# Patient Record
Sex: Male | Born: 1955
Health system: Southern US, Community
[De-identification: ages and names within clinical notes are randomized; demographics above are authoritative.]

## PROBLEM LIST (undated history)

## (undated) DIAGNOSIS — K219 Gastro-esophageal reflux disease without esophagitis: Secondary | ICD-10-CM

## (undated) DIAGNOSIS — E785 Hyperlipidemia, unspecified: Secondary | ICD-10-CM

## (undated) DIAGNOSIS — E1165 Type 2 diabetes mellitus with hyperglycemia: Secondary | ICD-10-CM

## (undated) HISTORY — DX: Hyperlipidemia, unspecified: E78.5

## (undated) HISTORY — PX: NO PAST SURGERIES: SHX2092

## (undated) HISTORY — DX: Gastro-esophageal reflux disease without esophagitis: K21.9

---

## 2006-05-31 ENCOUNTER — Ambulatory Visit: Payer: Self-pay | Admitting: Internal Medicine

## 2006-05-31 DIAGNOSIS — M545 Low back pain, unspecified: Secondary | ICD-10-CM | POA: Insufficient documentation

## 2006-07-09 ENCOUNTER — Ambulatory Visit (HOSPITAL_COMMUNITY): Admission: RE | Admit: 2006-07-09 | Discharge: 2006-07-09 | Payer: Self-pay | Admitting: Internal Medicine

## 2006-07-09 ENCOUNTER — Ambulatory Visit: Payer: Self-pay | Admitting: Internal Medicine

## 2006-07-09 DIAGNOSIS — R7309 Other abnormal glucose: Secondary | ICD-10-CM | POA: Insufficient documentation

## 2006-07-26 ENCOUNTER — Ambulatory Visit: Payer: Self-pay | Admitting: Internal Medicine

## 2006-11-28 ENCOUNTER — Ambulatory Visit: Payer: Self-pay | Admitting: Internal Medicine

## 2007-02-25 ENCOUNTER — Encounter (INDEPENDENT_AMBULATORY_CARE_PROVIDER_SITE_OTHER): Payer: Self-pay | Admitting: Internal Medicine

## 2007-02-25 DIAGNOSIS — E785 Hyperlipidemia, unspecified: Secondary | ICD-10-CM | POA: Insufficient documentation

## 2007-02-25 DIAGNOSIS — E1169 Type 2 diabetes mellitus with other specified complication: Secondary | ICD-10-CM | POA: Insufficient documentation

## 2007-04-01 ENCOUNTER — Telehealth (INDEPENDENT_AMBULATORY_CARE_PROVIDER_SITE_OTHER): Payer: Self-pay | Admitting: *Deleted

## 2007-04-18 ENCOUNTER — Ambulatory Visit (HOSPITAL_COMMUNITY): Admission: RE | Admit: 2007-04-18 | Discharge: 2007-04-18 | Payer: Self-pay | Admitting: Internal Medicine

## 2007-04-18 ENCOUNTER — Ambulatory Visit: Payer: Self-pay | Admitting: Internal Medicine

## 2007-04-18 DIAGNOSIS — M79609 Pain in unspecified limb: Secondary | ICD-10-CM | POA: Insufficient documentation

## 2007-04-18 LAB — CONVERTED CEMR LAB: Hgb A1c MFr Bld: 6.2 %

## 2007-04-20 ENCOUNTER — Encounter (INDEPENDENT_AMBULATORY_CARE_PROVIDER_SITE_OTHER): Payer: Self-pay | Admitting: Internal Medicine

## 2007-04-20 LAB — CONVERTED CEMR LAB
LDL Cholesterol: 137 mg/dL — ABNORMAL HIGH (ref 0–99)
Total CHOL/HDL Ratio: 3.2

## 2007-05-15 ENCOUNTER — Encounter (INDEPENDENT_AMBULATORY_CARE_PROVIDER_SITE_OTHER): Payer: Self-pay | Admitting: Internal Medicine

## 2013-02-08 ENCOUNTER — Observation Stay (HOSPITAL_COMMUNITY)
Admission: EM | Admit: 2013-02-08 | Discharge: 2013-02-10 | Disposition: A | Discharge status: Home / Self Care | Payer: 59 | Attending: Internal Medicine | Admitting: Internal Medicine

## 2013-02-08 ENCOUNTER — Encounter (HOSPITAL_COMMUNITY): Payer: Self-pay | Admitting: Radiology

## 2013-02-08 ENCOUNTER — Emergency Department (HOSPITAL_COMMUNITY): Payer: 59

## 2013-02-08 DIAGNOSIS — E118 Type 2 diabetes mellitus with unspecified complications: Secondary | ICD-10-CM | POA: Diagnosis present

## 2013-02-08 DIAGNOSIS — R079 Chest pain, unspecified: Principal | ICD-10-CM | POA: Insufficient documentation

## 2013-02-08 DIAGNOSIS — E119 Type 2 diabetes mellitus without complications: Secondary | ICD-10-CM | POA: Diagnosis present

## 2013-02-08 DIAGNOSIS — E785 Hyperlipidemia, unspecified: Secondary | ICD-10-CM | POA: Insufficient documentation

## 2013-02-08 DIAGNOSIS — R0602 Shortness of breath: Secondary | ICD-10-CM | POA: Insufficient documentation

## 2013-02-08 DIAGNOSIS — R7309 Other abnormal glucose: Secondary | ICD-10-CM | POA: Diagnosis present

## 2013-02-08 DIAGNOSIS — E1169 Type 2 diabetes mellitus with other specified complication: Secondary | ICD-10-CM | POA: Diagnosis present

## 2013-02-08 DIAGNOSIS — D72829 Elevated white blood cell count, unspecified: Secondary | ICD-10-CM

## 2013-02-08 DIAGNOSIS — I440 Atrioventricular block, first degree: Secondary | ICD-10-CM | POA: Insufficient documentation

## 2013-02-08 DIAGNOSIS — E1165 Type 2 diabetes mellitus with hyperglycemia: Secondary | ICD-10-CM | POA: Diagnosis present

## 2013-02-08 DIAGNOSIS — I319 Disease of pericardium, unspecified: Secondary | ICD-10-CM | POA: Insufficient documentation

## 2013-02-08 HISTORY — DX: Type 2 diabetes mellitus with hyperglycemia: E11.65

## 2013-02-08 LAB — POCT I-STAT TROPONIN I: Troponin i, poc: 0.01 ng/mL (ref 0.00–0.08)

## 2013-02-08 LAB — BASIC METABOLIC PANEL
BUN: 14 mg/dL (ref 6–23)
CO2: 25 mEq/L (ref 19–32)
Chloride: 100 mEq/L (ref 96–112)
Creatinine, Ser: 0.9 mg/dL (ref 0.50–1.35)
GFR calc Af Amer: >90 mL/min (ref >90)
Glucose, Bld: 240 mg/dL — ABNORMAL HIGH (ref 70–99)
Potassium: 3.7 mEq/L (ref 3.5–5.1)

## 2013-02-08 LAB — CBC
HCT: 39.8 % (ref 39.0–52.0)
MCV: 80.7 fL (ref 78.0–100.0)
Platelets: 245 10*3/uL (ref 150–400)

## 2013-02-08 MED ORDER — MORPHINE SULFATE 4 MG/ML IJ SOLN
4.0000 mg | Freq: Once | INTRAMUSCULAR | Status: AC
  Administered 2013-02-08: 4 mg via INTRAVENOUS
  Filled 2013-02-08: qty 1

## 2013-02-08 MED ORDER — IOHEXOL 350 MG/ML SOLN
100.0000 mL | Freq: Once | INTRAVENOUS | Status: AC | PRN
  Administered 2013-02-08: 100 mL via INTRAVENOUS

## 2013-02-08 MED ORDER — ASPIRIN 81 MG PO CHEW
324.0000 mg | CHEWABLE_TABLET | Freq: Once | ORAL | Status: AC
  Administered 2013-02-08: 324 mg via ORAL
  Filled 2013-02-08: qty 4

## 2013-02-08 NOTE — ED Notes (Signed)
Pt reports left sided CP at 1500 today while watching television. Denies N/V, diaphoresis and SOB. Worse with inspiration. Denies at this time. NAD.

## 2013-02-08 NOTE — ED Notes (Signed)
Denis N/V, diaphoresis, weakness

## 2013-02-08 NOTE — ED Provider Notes (Signed)
CSN: [id number]     Arrival date & time 02/08/13  2047 History   First MD Initiated Contact with Patient 02/08/13 2050     Chief Complaint  Patient presents with  . Chest Pain   (Consider location/radiation/quality/duration/timing/severity/associated sxs/prior Treatment) HPI  57 year old male, Spanish-speaking, presents for evaluations of chest pain. History obtained using [employer] phone intepreter.  Patient states he developed acute onset of sharp pleuritic chest pain to his right chest radiates to his right, which started at 3 PM today. The pain happened while he was sitting and watching TV. Pain is worsened when he taking deep breath or when laying down. Improves with sitting up or walking.  Never had pain like this before. Pain is currently 6/10. No complaints of fever, chills, neck pain, lightheadedness, dizziness, shortness of breath, dyspnea on exertion, diaphoresis, abdominal pain, nausea, vomiting, diarrhea, back pain, numbness, weakness, or rash. No specific treatment tried. No prior history of cardiac disease or diabetes. No exertional chest pain. No prior cardiac workup. Patient is a nonsmoker. Patient does report being on a long car trip from [place] to [place], left on Sunday return on Wednesday. No history of cancer, prior PE or DVT, recent surgery, or prolonged bed rest. Denies any calf pain or leg swelling.  No past medical history on file. No past surgical history on file. No family history on file. History  Substance Use Topics  . Smoking status: Not on file  . Smokeless tobacco: Not on file  . Alcohol Use: Not on file    Review of Systems  All other systems reviewed and are negative.    Allergies  Review of patient's allergies indicates not on file.  Home Medications  No current outpatient prescriptions on file. There were no vitals taken for this visit. Physical Exam  Nursing note and vitals reviewed. Constitutional: He is oriented to person, place, and  time. He appears well-developed and well-nourished. No distress.  Awake, alert, nontoxic appearance  HENT:  Head: Atraumatic.  Eyes: Conjunctivae are normal. Right eye exhibits no discharge. Left eye exhibits no discharge.  Neck: Normal range of motion. Neck supple.  Cardiovascular: Regular rhythm and intact distal pulses.  Exam reveals no gallop and no friction rub.   No murmur heard. Mild tachycardia without murmurs, rubs, or gallops noted  Pulmonary/Chest: Effort normal. No respiratory distress. He exhibits no tenderness.  Abdominal: Soft. There is no tenderness. There is no rebound.  Musculoskeletal: He exhibits no edema and no tenderness.  ROM appears intact, no obvious focal weakness  Bilateral lower extremities without palpable cord, erythema, edema, negative Homans sign  Neurological: He is alert and oriented to person, place, and time.  Skin: Skin is warm and dry. No rash noted.  Psychiatric: He has a normal mood and affect.    ED Course  Procedures (including critical care time)   Date: 02/08/2013  Rate: 103   Rhythm: sinus tachycardia  QRS Axis: normal  Intervals: PR prolonged  ST/T Wave abnormalities: nonspecific ST/T changes  Conduction Disutrbances:first-degree A-V block   Narrative Interpretation:   Old EKG Reviewed: none available    9:18 PM Patient with pleuritic chest pain concerning for PE, considering that he is tachycardic and has had recent long road trip to [place]. He has no significant cardiac risk factors however his ECG changes demonstrating ST wave changes in the inferior leads which may suggest acute MI.  My attending is aware of finding, will consult cardiology, and will also consider chest CT  angiogram rule out PE as well.  Cardiology has reviewed ECG and felt that is not likely acute ischemic changes.  Will continue with chest CTA to r/o PE.  Sxs also suggest pericarditis as it is worsen with laying down and improves with sitting up or walking.   ASA given.    11:30 PM Repeat ECG showing diffused ST/T changes with ST elevation to inferior and lateral leads.  Chest CTA without acute finding, specifically no evidence of PE.  Due to ECG changes, and evidence to suggest pericarditis, will consult cardiology for admission.    11:42 PM I have consulted cardiologist, Dr. Jolene Schimke, who recommend pt to be admitted through hospitalist for further care.  Will consult hospitalist for admission.    11:46 PM i have consulted with [facility], Dr. Toniann Fail who agrees to admit pt to step down, team 10, under his care.  Labs Review Labs Reviewed  CBC - Abnormal; Notable for the following:    WBC 15.4 (*)    All other components within normal limits  BASIC METABOLIC PANEL - Abnormal; Notable for the following:    Glucose, Bld 240 (*)    All other components within normal limits  POCT I-STAT TROPONIN I   Imaging Review Dg Chest 2 View  02/08/2013   *RADIOLOGY REPORT*  Clinical Data: Chest pain  CHEST - 2 VIEW  Comparison: None.  Findings: Cardiac and mediastinal silhouettes are within normal limits.  Lung volumes are normal.  No airspace consolidation, pleural effusion, or pulmonary edema is identified.  There is no pneumothorax.  No acute osseous abnormality.  IMPRESSION: No acute cardiopulmonary process.   Original Report Authenticated By: Rise Mu, M.D.   Ct Angio Chest Pe W/cm &/or Wo Cm  02/08/2013   *RADIOLOGY REPORT*  Clinical Data: Chest pain and shortness of breath  CT ANGIOGRAPHY CHEST  Technique:  Multidetector CT imaging of the chest using the standard protocol during bolus administration of intravenous contrast. Multiplanar reconstructed images including MIPs were obtained and reviewed to evaluate the vascular anatomy.  Contrast: OMNIPAQUE IOHEXOL 350 MG/ML SOLN  Comparison: Radiograph performed earlier on the same day.  Findings: No pathologically enlarged mediastinal, hilar, or axillary lymph nodes are  identified.  The aorta demonstrates a normal contrast enhanced appearance without evidence of dissection or other acute abnormality. Incidental note is made of an and there are into right subclavian artery coursing posteriorly to the esophagus.  The heart is within normal limits.  No pericardial effusion.  Pulmonary arterial tree is well opacified.  No filling defects to suggest acute pulmonary embolism identified.  Reformatted imaging confirms these findings.  Dependent subsegmental atelectasis is present within both lower lobes bilaterally.  No pulmonary edema or pleural effusion is present.  No airspace consolidation.  There is no pneumothorax.  No pulmonary nodule or mass identified.  Partially visualized upper abdomen is unremarkable.  No acute osseous abnormality identified.  Multilevel degenerative disc disease noted within the mid thoracic spine.  IMPRESSION: 1.  No CT evidence of acute pulmonary embolism. 2.  Aberrant right subclavian artery.   Original Report Authenticated By: Rise Mu, M.D.    MDM   1. Chest pain of pericarditis   2. Leukocytosis    BP 116/71  Pulse 79  Temp(Src) 99 F (37.2 C) (Oral)  Resp 18  SpO2 96%  I have reviewed nursing notes and vital signs. I personally reviewed the imaging tests through PACS system  I reviewed available ER/hospitalization records thought the EMR  Fayrene Helper, PA-C 02/09/13 [phone]

## 2013-02-08 NOTE — ED Notes (Signed)
Pt c/o right sided CP that radiates to right shoulder and back. Worse with deep breath and laying down. Pain described as tightness

## 2013-02-08 NOTE — ED Notes (Signed)
Repeat EKG performed. MD Hyacinth Meeker signed. Pt continues to be pain free.

## 2013-02-08 NOTE — ED Provider Notes (Signed)
57 year old male who presents to the hospital with chest pain that started at 3:00 in the afternoon, it is substernal and right sided, sharp in nature, worse with deep breathing and worse with a supine position. He feels better when he sits upright and when he walks. He recently had a trip where he went to [place] by car, spent many hours immobilized. He has no swelling of the legs, no cough or fevers, no abdominal pain. On exam he is clear heart and lung sounds however he is mildly tachycardic. There is no peripheral edema or asymmetry and he has a soft nontender abdomen. His EKG shows mild diffuse ST elevations in PR depressions. This is not anatomic and does not appear to be consistent with an ischemic myocardial infarction. I discussed his care with the cardiologist on call who has also viewed the EKG and agrees that this is not likely to be ischemic. We will look for pulmonary embolism, possible pericarditis. He is hemodynamically stable at this time and oxygenating without difficulty.  Medical screening examination/treatment/procedure(s) were conducted as a shared visit with non-physician practitioner(s) and myself.  I personally evaluated the patient during the encounter.  Clinical Impression: Chest pain, possible pericarditis     Vida Roller, MD 02/08/13 2358

## 2013-02-09 ENCOUNTER — Encounter (HOSPITAL_COMMUNITY): Payer: Self-pay | Admitting: Internal Medicine

## 2013-02-09 DIAGNOSIS — I319 Disease of pericardium, unspecified: Secondary | ICD-10-CM

## 2013-02-09 DIAGNOSIS — R079 Chest pain, unspecified: Secondary | ICD-10-CM | POA: Diagnosis present

## 2013-02-09 LAB — TROPONIN I
Troponin I: <0.3 ng/mL (ref <0.30)
Troponin I: <0.3 ng/mL (ref <0.30)

## 2013-02-09 LAB — LIPID PANEL
Cholesterol: 226 mg/dL — ABNORMAL HIGH (ref 0–200)
HDL: 66 mg/dL (ref >39)
Total CHOL/HDL Ratio: 3.4 RATIO
Triglycerides: 187 mg/dL — ABNORMAL HIGH (ref <150)
VLDL: 37 mg/dL (ref 0–40)

## 2013-02-09 LAB — CBC
HCT: 39.2 % (ref 39.0–52.0)
MCHC: 35.2 g/dL (ref 30.0–36.0)
Platelets: 249 10*3/uL (ref 150–400)
RDW: 12.4 % (ref 11.5–15.5)
WBC: 9.9 10*3/uL (ref 4.0–10.5)

## 2013-02-09 LAB — BASIC METABOLIC PANEL
Chloride: 101 mEq/L (ref 96–112)
Creatinine, Ser: 0.91 mg/dL (ref 0.50–1.35)
GFR calc Af Amer: >90 mL/min (ref >90)
GFR calc non Af Amer: >90 mL/min (ref >90)

## 2013-02-09 LAB — SEDIMENTATION RATE: Sed Rate: 5 mm/hr (ref 0–16)

## 2013-02-09 LAB — HEMOGLOBIN A1C: Mean Plasma Glucose: 166 mg/dL — ABNORMAL HIGH (ref <117)

## 2013-02-09 MED ORDER — IBUPROFEN 600 MG PO TABS: 600.0000 mg | ORAL_TABLET | Freq: Three times a day (TID) | ORAL | Status: DC

## 2013-02-09 MED ORDER — PANTOPRAZOLE SODIUM 40 MG PO TBEC
40.0000 mg | DELAYED_RELEASE_TABLET | Freq: Every day | ORAL | Status: DC
  Administered 2013-02-09 – 2013-02-10 (×2): 40 mg via ORAL
  Filled 2013-02-09 (×2): qty 1

## 2013-02-09 MED ORDER — HYDROMORPHONE HCL PF 1 MG/ML IJ SOLN: 1.0000 mg | INTRAMUSCULAR | Status: DC | PRN

## 2013-02-09 MED ORDER — INSULIN ASPART 100 UNIT/ML ~~LOC~~ SOLN
0.0000 [IU] | Freq: Three times a day (TID) | SUBCUTANEOUS | Status: DC
  Administered 2013-02-09: 2 [IU] via SUBCUTANEOUS
  Administered 2013-02-09: 1 [IU] via SUBCUTANEOUS
  Administered 2013-02-09: 2 [IU] via SUBCUTANEOUS
  Administered 2013-02-10: 09:00:00 via SUBCUTANEOUS

## 2013-02-09 MED ORDER — SODIUM CHLORIDE 0.9 % IV SOLN
INTRAVENOUS | Status: DC
  Administered 2013-02-09: 01:00:00 via INTRAVENOUS

## 2013-02-09 MED ORDER — HYDROMORPHONE HCL PF 1 MG/ML IJ SOLN
1.0000 mg | INTRAMUSCULAR | Status: DC | PRN
  Administered 2013-02-09 (×3): 1 mg via INTRAVENOUS
  Filled 2013-02-09 (×3): qty 1

## 2013-02-09 MED ORDER — COLCHICINE 0.6 MG PO TABS: 0.6000 mg | ORAL_TABLET | Freq: Two times a day (BID) | ORAL | Status: DC

## 2013-02-09 MED ORDER — ONDANSETRON HCL 4 MG/2ML IJ SOLN: 4.0000 mg | Freq: Three times a day (TID) | INTRAMUSCULAR | Status: DC | PRN

## 2013-02-09 MED ORDER — ONDANSETRON HCL 4 MG/2ML IJ SOLN: 4.0000 mg | Freq: Four times a day (QID) | INTRAMUSCULAR | Status: DC | PRN

## 2013-02-09 MED ORDER — IBUPROFEN 600 MG PO TABS
600.0000 mg | ORAL_TABLET | Freq: Three times a day (TID) | ORAL | Status: DC
  Administered 2013-02-09 – 2013-02-10 (×4): 600 mg via ORAL
  Filled 2013-02-09 (×6): qty 1

## 2013-02-09 MED ORDER — ACETAMINOPHEN 325 MG PO TABS: 650.0000 mg | ORAL_TABLET | Freq: Four times a day (QID) | ORAL | Status: DC | PRN

## 2013-02-09 MED ORDER — ACETAMINOPHEN 650 MG RE SUPP: 650.0000 mg | Freq: Four times a day (QID) | RECTAL | Status: DC | PRN

## 2013-02-09 MED ORDER — COLCHICINE 0.6 MG PO TABS
0.6000 mg | ORAL_TABLET | Freq: Two times a day (BID) | ORAL | Status: DC
  Administered 2013-02-09 – 2013-02-10 (×3): 0.6 mg via ORAL
  Filled 2013-02-09 (×4): qty 1

## 2013-02-09 MED ORDER — ASPIRIN EC 81 MG PO TBEC
81.0000 mg | DELAYED_RELEASE_TABLET | Freq: Every day | ORAL | Status: DC
  Administered 2013-02-10: 81 mg via ORAL
  Filled 2013-02-09: qty 1

## 2013-02-09 MED ORDER — OMEGA-3 FATTY ACIDS 1000 MG PO CAPS: 1.0000 g | ORAL_CAPSULE | Freq: Every day | ORAL | Status: DC

## 2013-02-09 MED ORDER — ASPIRIN EC 325 MG PO TBEC
325.0000 mg | DELAYED_RELEASE_TABLET | Freq: Every day | ORAL | Status: DC
  Administered 2013-02-09: 325 mg via ORAL
  Filled 2013-02-09: qty 1

## 2013-02-09 MED ORDER — ONDANSETRON HCL 4 MG PO TABS: 4.0000 mg | ORAL_TABLET | Freq: Four times a day (QID) | ORAL | Status: DC | PRN

## 2013-02-09 MED ORDER — SODIUM CHLORIDE 0.9 % IV SOLN
INTRAVENOUS | Status: DC
  Administered 2013-02-09: 10 mL via INTRAVENOUS

## 2013-02-09 MED ORDER — OMEGA-3-ACID ETHYL ESTERS 1 G PO CAPS
1.0000 g | ORAL_CAPSULE | Freq: Every day | ORAL | Status: DC
  Administered 2013-02-09 – 2013-02-10 (×2): 1 g via ORAL
  Filled 2013-02-09 (×3): qty 1

## 2013-02-09 MED ORDER — SODIUM CHLORIDE 0.9 % IJ SOLN
3.0000 mL | Freq: Two times a day (BID) | INTRAMUSCULAR | Status: DC
  Administered 2013-02-09 – 2013-02-10 (×3): 3 mL via INTRAVENOUS

## 2013-02-09 NOTE — ED Provider Notes (Signed)
Medical screening examination/treatment/procedure(s) were conducted as a shared visit with non-physician practitioner(s) and myself.  I personally evaluated the patient during the encounter  Please see my separate respective documentation pertaining to this patient encounter   Vida Roller, MD 02/09/13 1723

## 2013-02-09 NOTE — H&P (Signed)
[facility] History and Physical  Kaylib Furness ZOX:[id number] DOB: [birth year] DOA: 02/08/2013  Referring physician: ER physician. PCP: Julieanne Manson, MD   Chief Complaint: Chest pain.  HPI: Edward Mcgee is a 57 y.o. male with no significant past medical history(though charts mentioned diabetes mellitus and hyperlipidemia) presents with complaints of chest pain. Patient states that his chest pain started last evening around 3 PM. The pain was retrosternal sharp increased on deep inspiration and on lying down. Patient's chest pain improved on exertion. Denies any shortness of breath fever chills or any productive cough. The pain is persistent and patient came to the ER. EKG shows diffuse ST changes particularly in the inferior lateral leads with PR depression concerning for pericarditis. CT angiogram of the chest was negative for PE. Cardiac markers were negative. On-call cardiologist was consulted and at this time they have requested admission by hospitalist. Patient otherwise denies any recent upper respiratory tract infections or joint swellings.  Review of Systems: As presented in the history of presenting illness, rest negative.  History reviewed. No pertinent past medical history. Past Surgical History  Procedure Laterality Date  . No past surgeries     Social History:  reports that he has never smoked. He does not have any smokeless tobacco history on file. He reports that he does not drink alcohol or use illicit drugs. Home. where does patient live-- Can do ADLs. Can patient participate in ADLs?  No Known Allergies  Family History  Problem Relation Age of Onset  . Breast cancer Sister       Prior to Admission medications   Medication Sig Start Date End Date Taking? Authorizing Provider  fish oil-omega-3 fatty acids 1000 MG capsule Take 1 g by mouth daily.   Yes Historical Provider, MD  Glucosamine HCl (GLUCOSAMINE PO) Take 1 tablet by mouth daily.   Yes Historical  Provider, MD  MAGNESIUM PO Take 1 tablet by mouth daily.   Yes Historical Provider, MD  omeprazole (PRILOSEC) 20 MG capsule Take 20 mg by mouth daily as needed (gerd).   Yes Historical Provider, MD   Physical Exam: Filed Vitals:   02/08/13 2215 02/08/13 2330 02/09/13 0000 02/09/13 0100  BP: 107/67 116/71 115/76 126/76  Pulse: 88 79 75 75  Temp:      TempSrc:      Resp: 16 18 19 15   SpO2: 96% 96% 96% 96%     General:  Well-developed well-nourished.  Eyes: Anicteric no pallor.  ENT: No discharge from ears eyes nose mouth.  Neck: No mass felt.  Cardiovascular: S1-S2 heard.  Respiratory: No rhonchi or crepitations.  Abdomen: Soft nontender bowel sounds present.  Skin: No rash.  Musculoskeletal: No edema.  Psychiatric: Appears normal.  Neurologic: Alert awake oriented to time place and person. Moves all extremities.  Labs on Admission:  Basic Metabolic Panel:  Recent Labs Lab 02/08/13 2055  NA 137  K 3.7  CL 100  CO2 25  GLUCOSE 240*  BUN 14  CREATININE 0.90  CALCIUM 9.2   Liver Function Tests: No results found for this basename: AST, ALT, ALKPHOS, BILITOT, PROT, ALBUMIN,  in the last 168 hours No results found for this basename: LIPASE, AMYLASE,  in the last 168 hours No results found for this basename: AMMONIA,  in the last 168 hours CBC:  Recent Labs Lab 02/08/13 2055  WBC 15.4*  HGB 14.1  HCT 39.8  MCV 80.7  PLT 245   Cardiac Enzymes: No results found for this basename: CKTOTAL, CKMB,  CKMBINDEX, TROPONINI,  in the last 168 hours  BNP (last 3 results) No results found for this basename: PROBNP,  in the last 8760 hours CBG: No results found for this basename: GLUCAP,  in the last 168 hours  Radiological Exams on Admission: Dg Chest 2 View  02/08/2013   *RADIOLOGY REPORT*  Clinical Data: Chest pain  CHEST - 2 VIEW  Comparison: None.  Findings: Cardiac and mediastinal silhouettes are within normal limits.  Lung volumes are normal.  No airspace  consolidation, pleural effusion, or pulmonary edema is identified.  There is no pneumothorax.  No acute osseous abnormality.  IMPRESSION: No acute cardiopulmonary process.   Original Report Authenticated By: Rise Mu, M.D.   Ct Angio Chest Pe W/cm &/or Wo Cm  02/08/2013   *RADIOLOGY REPORT*  Clinical Data: Chest pain and shortness of breath  CT ANGIOGRAPHY CHEST  Technique:  Multidetector CT imaging of the chest using the standard protocol during bolus administration of intravenous contrast. Multiplanar reconstructed images including MIPs were obtained and reviewed to evaluate the vascular anatomy.  Contrast: OMNIPAQUE IOHEXOL 350 MG/ML SOLN  Comparison: Radiograph performed earlier on the same day.  Findings: No pathologically enlarged mediastinal, hilar, or axillary lymph nodes are identified.  The aorta demonstrates a normal contrast enhanced appearance without evidence of dissection or other acute abnormality. Incidental note is made of an and there are into right subclavian artery coursing posteriorly to the esophagus.  The heart is within normal limits.  No pericardial effusion.  Pulmonary arterial tree is well opacified.  No filling defects to suggest acute pulmonary embolism identified.  Reformatted imaging confirms these findings.  Dependent subsegmental atelectasis is present within both lower lobes bilaterally.  No pulmonary edema or pleural effusion is present.  No airspace consolidation.  There is no pneumothorax.  No pulmonary nodule or mass identified.  Partially visualized upper abdomen is unremarkable.  No acute osseous abnormality identified.  Multilevel degenerative disc disease noted within the mid thoracic spine.  IMPRESSION: 1.  No CT evidence of acute pulmonary embolism. 2.  Aberrant right subclavian artery.   Original Report Authenticated By: Rise Mu, M.D.    EKG: Independently reviewed. Normal sinus rhythm with diffuse ST deviations noted in the inferior  lateral leads with PR depression.  Assessment/Plan Principal Problem:   Chest pain   1. Chest pain - concerning for pericarditis at this time. Cycle cardiac markers. Check sedimentation rate. Check 2-D echo. Continue aspirin and pain medications. May consult cardiologist in a.m. Will try to avoid heparin/Lovenox. 2. Hyperglycemia - check hemoglobin A1c. Presently have placed patient on sliding-scale coverage. 3. History of hyperlipidemia - check lipid panel.    Code Status: Full code.  Family Communication: Patient's wife at the bedside.  Disposition Plan: Admit for observation.    Doak Mah N. [facility] Pager [phone].  If 7PM-7AM, please contact night-coverage [email or web address] Password [email or web address] 02/09/2013, 1:09 AM

## 2013-02-09 NOTE — Progress Notes (Addendum)
[facility] Progress Note [place] TEAM 1 - Stepdown/ICU TEAM   Edward Mcgee ZOX:[id number] DOB: March 27, 1956 DOA: 02/08/2013 PCP: Julieanne Manson, MD  Brief narrative/Admit HPI: 57 y.o. male with no significant past medical history (though charts mentioned diabetes mellitus and hyperlipidemia) presents with complaints of chest pain. Patient states that his chest pain started last evening around 3 PM. The pain was retrosternal sharp increased on deep inspiration and on lying down. Patient's chest pain improved on exertion. Denies any shortness of breath fever chills or any productive cough. The pain is persistent and patient came to the ER. EKG shows diffuse ST changes particularly in the inferior lateral leads with PR depression concerning for pericarditis. CT angiogram of the chest was negative for PE. Cardiac markers were negative. On-call cardiologist was consulted and at this time they have requested admission by hospitalist. Patient otherwise denies any recent upper respiratory tract infections or joint swellings.   Assessment/Plan:  Pleuritic chest pain concerning for pericarditis, but sed rate normal - try to avoid heparin/Lovenox  Hyperglycemia check hemoglobin A1c  Hyperlipidemia  LDL 123   Code Status: FULL Family Communication: spoke to pt and wife at bedside  Disposition Plan: f/u echo for possible WMA or effusion - follow clinical sx - EKG in AM for f/u - possible D/C in AM 9/2  Consultants: none  Procedures: TTE - pending  Antibiotics: none  DVT prophylaxis: SCDs  HPI/Subjective: Pt seen for f/u visit.   Objective: Blood pressure 124/72, pulse 65, temperature 98 F (36.7 C), temperature source Oral, resp. rate 15, height 5\' 4"  (1.626 m), weight 72.2 kg (159 lb 2.8 oz), SpO2 99.00%.  Intake/Output Summary (Last 24 hours) at 02/09/13 0931 Last data filed at 02/09/13 0800  Gross per 24 hour  Intake 178.33 ml  Output    500 ml  Net -321.67 ml    Exam: F/u exam completed   Data Reviewed: Basic Metabolic Panel:  Recent Labs Lab 02/08/13 2055 02/09/13 0220  NA 137 138  K 3.7 3.7  CL 100 101  CO2 25 28  GLUCOSE 240* 155*  BUN 14 14  CREATININE 0.90 0.91  CALCIUM 9.2 9.1   Liver Function Tests: No results found for this basename: AST, ALT, ALKPHOS, BILITOT, PROT, ALBUMIN,  in the last 168 hours  CBC:  Recent Labs Lab 02/08/13 2055 02/09/13 0220  WBC 15.4* 9.9  HGB 14.1 13.8  HCT 39.8 39.2  MCV 80.7 81.2  PLT 245 249   Cardiac Enzymes:  Recent Labs Lab 02/09/13 0220 02/09/13 0720  TROPONINI <0.30 <0.30   CBG:  Recent Labs Lab 02/09/13 0737  GLUCAP 184*    Recent Results (from the past 240 hour(s))  MRSA PCR SCREENING     Status: None   Collection Time    02/09/13  2:10 AM      Result Value Range Status   MRSA by PCR NEGATIVE  NEGATIVE Final   Comment:            The GeneXpert MRSA Assay (FDA     approved for NASAL specimens     only), is one component of a     comprehensive MRSA colonization     surveillance program. It is not     intended to diagnose MRSA     infection nor to guide or     monitor treatment for     MRSA infections.     Studies:  Recent x-ray studies have been reviewed in detail by the Attending  Physician  Scheduled Meds:  Scheduled Meds: . aspirin EC  325 mg Oral Daily  . insulin aspart  0-9 Units Subcutaneous TID WC  . omega-3 acid ethyl esters  1 g Oral Daily  . pantoprazole  40 mg Oral Daily  . sodium chloride  3 mL Intravenous Q12H    Time spent on care of this patient: 25+ mins   [facility] T  [facility] Office  [phone] Pager - Text Page per Loretha Stapler as per below:  On-Call/Text Page:      [email or web address]      password [email or web address]  If 7PM-7AM, please contact night-coverage [email or web address] Password [email or web address] 02/09/2013, 9:31 AM   LOS: 1 day

## 2013-02-09 NOTE — Progress Notes (Signed)
*  PRELIMINARY RESULTS* Echocardiogram 2D Echocardiogram has been performed.  Edward Mcgee 02/09/2013, 1:57 PM

## 2013-02-09 NOTE — Progress Notes (Signed)
Educated patient and wife on HGB A1C information and Diabetes. Provided information in spanish for them to read. Patient very receptive and aware of the disease. Explained the importance of monitoring your diabetes with a primary care physician. Will continue to monitor.  Edward Mcgee, Charlaine Dalton

## 2013-02-10 ENCOUNTER — Encounter (HOSPITAL_COMMUNITY): Payer: Self-pay | Admitting: Internal Medicine

## 2013-02-10 DIAGNOSIS — R079 Chest pain, unspecified: Principal | ICD-10-CM

## 2013-02-10 DIAGNOSIS — E785 Hyperlipidemia, unspecified: Secondary | ICD-10-CM

## 2013-02-10 DIAGNOSIS — I440 Atrioventricular block, first degree: Secondary | ICD-10-CM

## 2013-02-10 DIAGNOSIS — E119 Type 2 diabetes mellitus without complications: Secondary | ICD-10-CM | POA: Diagnosis present

## 2013-02-10 DIAGNOSIS — IMO0001 Reserved for inherently not codable concepts without codable children: Secondary | ICD-10-CM

## 2013-02-10 DIAGNOSIS — E118 Type 2 diabetes mellitus with unspecified complications: Secondary | ICD-10-CM | POA: Diagnosis present

## 2013-02-10 DIAGNOSIS — IMO0002 Reserved for concepts with insufficient information to code with codable children: Secondary | ICD-10-CM

## 2013-02-10 DIAGNOSIS — E1165 Type 2 diabetes mellitus with hyperglycemia: Secondary | ICD-10-CM | POA: Diagnosis present

## 2013-02-10 HISTORY — DX: Reserved for concepts with insufficient information to code with codable children: IMO0002

## 2013-02-10 HISTORY — DX: Type 2 diabetes mellitus with hyperglycemia: E11.65

## 2013-02-10 LAB — GLUCOSE, CAPILLARY
Glucose-Capillary: 157 mg/dL — ABNORMAL HIGH (ref 70–99)
Glucose-Capillary: 114 mg/dL — ABNORMAL HIGH (ref 70–99)

## 2013-02-10 MED ORDER — LIVING WELL WITH DIABETES BOOK - IN SPANISH
Freq: Once | Status: DC
  Filled 2013-02-10: qty 1

## 2013-02-10 MED ORDER — IBUPROFEN 200 MG PO TABS: 400.0000 mg | ORAL_TABLET | Freq: Four times a day (QID) | ORAL | Status: DC | PRN

## 2013-02-10 NOTE — Discharge Summary (Addendum)
Physician Discharge Summary  Edward Mcgee ZOX:[id number] DOB: [birth year] DOA: 02/08/2013  PCP: Julieanne Manson, MD  Admit date: 02/08/2013 Discharge date: 02/10/2013  Time spent: >45 minutes  OutPT f/u - PCP to follow up on hyperglycemia and hyperlipidemia- have asked that he make an appt ASAP  Discharge Diagnoses:  Principal Problem:   Chest pain- possible acute pericarditis vs musculoskeletal Active Problems:   HYPERLIPIDEMIA   DIABETES MELLITUS   AV block, 1st degree   Discharge Condition: stable  Diet recommendation: heart healthy, diabetic  Filed Weights   02/09/13 0147  Weight: 72.2 kg (159 lb 2.8 oz)    History of present illness:  Edward Mcgee is a 57 y.o. male with no significant past medical history(though charts mentioned diabetes mellitus and hyperlipidemia) presents with complaints of chest pain. Patient states that his chest pain started last evening around 3 PM. The pain was retrosternal sharp increased on deep inspiration and on lying down. Patient's chest pain improved on exertion. Denies any shortness of breath fever chills or any productive cough. The pain is persistent and patient came to the ER. EKG shows diffuse ST changes particularly in the inferior lateral leads with PR depression concerning for pericarditis. CT angiogram of the chest was negative for PE. Cardiac markers were negative. On-call cardiologist was consulted and at this time they have requested admission by hospitalist. Patient otherwise denies any recent upper respiratory tract infections or joint swellings   Hospital Course:  Pleuritic chest pain  concerning for pericarditis based upon history -EKG reviewed and No significant ST Elevations noted  - ECHO - essentially unremarkalble Left ventricle: The cavity size was normal. Wall thickness was normal. Systolic function was normal. The estimated ejection fraction was in the range of 60% to 65%. Left ventricular diastolic function  parameters were normal. - Left atrium: The atrium was mildly dilated.  - Cardiac enzymes negative - Chest pain resolved after Motrin and Colchicine given on 9/1 - will cont Motrin PRN as outpt- - he is taking omeprazole which should protect his stomach while he takes Ibuprofen   Hyperglycemia / Diabetes mellitus Aic 6.4  Hyperlipidemia  Chol 226, Trigly 187, LDL 123 - Per Guidelines LDL should be < 70 in diabetics  First degree AV block  -with PR interval 206 ms   Discharge Exam: Filed Vitals:   02/10/13 1221  BP: 124/70  Pulse: 63  Temp: 98 F (36.7 C)  Resp: 16    General: AAO x 3, no distress Chest: no tenderness on palpation Cardiovascular: RRR, no murmurs Respiratory: CTA b/l   Discharge Instructions     Medication List         fish oil-omega-3 fatty acids 1000 MG capsule  Take 1 g by mouth daily.     GLUCOSAMINE PO  Take 1 tablet by mouth daily.     ibuprofen 200 MG tablet  Commonly known as:  [facility] IBUPROFEN  Take 2-3 tablets (400-600 mg total) by mouth every 6 (six) hours as needed for pain (every 6-8 hrs as needed for chest pain).     MAGNESIUM PO  Take 1 tablet by mouth daily.     omeprazole 20 MG capsule  Commonly known as:  PRILOSEC  Take 20 mg by mouth daily as needed (gerd).       No Known Allergies Follow-up Information   Schedule an appointment as soon as possible for a visit with Julieanne Manson, MD. (ASAP)    Specialty:  Internal Medicine   Contact information:   [phone]  [address] [place] [address] [phone]        The results of significant diagnostics from this hospitalization (including imaging, microbiology, ancillary and laboratory) are listed below for reference.    Significant Diagnostic Studies: Dg Chest 2 View  02/08/2013   *RADIOLOGY REPORT*  Clinical Data: Chest pain  CHEST - 2 VIEW  Comparison: None.  Findings: Cardiac and mediastinal silhouettes are within normal limits.  Lung volumes are  normal.  No airspace consolidation, pleural effusion, or pulmonary edema is identified.  There is no pneumothorax.  No acute osseous abnormality.  IMPRESSION: No acute cardiopulmonary process.   Original Report Authenticated By: Rise Mu, M.D.   Ct Angio Chest Pe W/cm &/or Wo Cm  02/08/2013   *RADIOLOGY REPORT*  Clinical Data: Chest pain and shortness of breath  CT ANGIOGRAPHY CHEST  Technique:  Multidetector CT imaging of the chest using the standard protocol during bolus administration of intravenous contrast. Multiplanar reconstructed images including MIPs were obtained and reviewed to evaluate the vascular anatomy.  Contrast: OMNIPAQUE IOHEXOL 350 MG/ML SOLN  Comparison: Radiograph performed earlier on the same day.  Findings: No pathologically enlarged mediastinal, hilar, or axillary lymph nodes are identified.  The aorta demonstrates a normal contrast enhanced appearance without evidence of dissection or other acute abnormality. Incidental note is made of an and there are into right subclavian artery coursing posteriorly to the esophagus.  The heart is within normal limits.  No pericardial effusion.  Pulmonary arterial tree is well opacified.  No filling defects to suggest acute pulmonary embolism identified.  Reformatted imaging confirms these findings.  Dependent subsegmental atelectasis is present within both lower lobes bilaterally.  No pulmonary edema or pleural effusion is present.  No airspace consolidation.  There is no pneumothorax.  No pulmonary nodule or mass identified.  Partially visualized upper abdomen is unremarkable.  No acute osseous abnormality identified.  Multilevel degenerative disc disease noted within the mid thoracic spine.  IMPRESSION: 1.  No CT evidence of acute pulmonary embolism. 2.  Aberrant right subclavian artery.   Original Report Authenticated By: Rise Mu, M.D.      Labs: Basic Metabolic Panel:  Recent Labs Lab 02/08/13 2055  02/09/13 0220  NA 137 138  K 3.7 3.7  CL 100 101  CO2 25 28  GLUCOSE 240* 155*  BUN 14 14  CREATININE 0.90 0.91  CALCIUM 9.2 9.1   Liver Function Tests: No results found for this basename: AST, ALT, ALKPHOS, BILITOT, PROT, ALBUMIN,  in the last 168 hours No results found for this basename: LIPASE, AMYLASE,  in the last 168 hours No results found for this basename: AMMONIA,  in the last 168 hours CBC:  Recent Labs Lab 02/08/13 2055 02/09/13 0220  WBC 15.4* 9.9  HGB 14.1 13.8  HCT 39.8 39.2  MCV 80.7 81.2  PLT 245 249   Cardiac Enzymes:  Recent Labs Lab 02/09/13 0220 02/09/13 0720 02/09/13 1400  TROPONINI <0.30 <0.30 <0.30   BNP: BNP (last 3 results) No results found for this basename: PROBNP,  in the last 8760 hours CBG:  Recent Labs Lab 02/09/13 1147 02/09/13 1656 02/09/13 2204 02/10/13 0819 02/10/13 1221  GLUCAP 165* 122* 139* 157* 114*   Lipid Panel     Component Value Date/Time   CHOL 226* 02/09/2013 0220   TRIG 187* 02/09/2013 0220   HDL 66 02/09/2013 0220   CHOLHDL 3.4 02/09/2013 0220   VLDL 37 02/09/2013 0220   LDLCALC 123* 02/09/2013 0220   .  SignedCalvert Cantor  [facility] 02/10/2013, 1:35 PM

## 2015-05-27 ENCOUNTER — Other Ambulatory Visit (INDEPENDENT_AMBULATORY_CARE_PROVIDER_SITE_OTHER): Payer: Commercial Managed Care - HMO

## 2015-05-27 ENCOUNTER — Ambulatory Visit (INDEPENDENT_AMBULATORY_CARE_PROVIDER_SITE_OTHER): Payer: Commercial Managed Care - HMO | Admitting: Family

## 2015-05-27 ENCOUNTER — Encounter: Payer: Self-pay | Admitting: Family

## 2015-05-27 VITALS — BP 124/84 | HR 71 | Temp 98.2°F | Resp 18 | Ht 68.0 in | Wt 150.8 lb

## 2015-05-27 DIAGNOSIS — IMO0001 Reserved for inherently not codable concepts without codable children: Secondary | ICD-10-CM

## 2015-05-27 DIAGNOSIS — R252 Cramp and spasm: Secondary | ICD-10-CM

## 2015-05-27 DIAGNOSIS — E1165 Type 2 diabetes mellitus with hyperglycemia: Secondary | ICD-10-CM | POA: Diagnosis not present

## 2015-05-27 DIAGNOSIS — M79604 Pain in right leg: Secondary | ICD-10-CM | POA: Insufficient documentation

## 2015-05-27 DIAGNOSIS — M25561 Pain in right knee: Secondary | ICD-10-CM | POA: Diagnosis not present

## 2015-05-27 LAB — BASIC METABOLIC PANEL
BUN: 17 mg/dL (ref 6–23)
CHLORIDE: 102 meq/L (ref 96–112)
CO2: 31 meq/L (ref 19–32)
CREATININE: 0.95 mg/dL (ref 0.40–1.50)
Calcium: 10.2 mg/dL (ref 8.4–10.5)
GFR: 85.74 mL/min (ref >60.00)
Glucose, Bld: 172 mg/dL — ABNORMAL HIGH (ref 70–99)
Potassium: 4.6 mEq/L (ref 3.5–5.1)
SODIUM: 138 meq/L (ref 135–145)

## 2015-05-27 LAB — MAGNESIUM: MAGNESIUM: 1.7 mg/dL (ref 1.5–2.5)

## 2015-05-27 LAB — HEMOGLOBIN A1C: HEMOGLOBIN A1C: 7.7 % — AB (ref 4.6–6.5)

## 2015-05-27 NOTE — Assessment & Plan Note (Signed)
Right knee pain consistent with osteoarthritis. Start Tumeric for inflammation. Continue OTC medications as needed for symptom relief and supportive care. Knee sleeve as needed. Home exercise therapy initiated. Follow-up if symptoms worsen or fail to improve.

## 2015-05-27 NOTE — Assessment & Plan Note (Signed)
Muscle cramps of undetermined origin. Obtain basic metabolic panel and magnesium to check for electrolyte imbalances. Cannot rule out underlying vascular issues. Continue current dosage of magnesium supplementation. Follow-up pending blood work for potential further workup if necessary. Instructed to start home exercise therapy with stretching and heat as needed.

## 2015-05-27 NOTE — Patient Instructions (Addendum)
Thank you for choosing [facility].  Summary/Instructions:  Please start taking 500 mg of Tumeric.   Your prescription(s) have been submitted to your pharmacy or been printed and provided for you. Please take as directed and contact our office if you believe you are having problem(s) with the medication(s) or have any questions.  Please stop by the lab on the basement level of the building for your blood work. Your results will be released to MyChart (or called to you) after review, usually within 72 hours after test completion. If any changes need to be made, you will be notified at that same time.  If your symptoms worsen or fail to improve, please contact our office for further instruction, or in case of emergency go directly to the emergency room at the closest medical facility.    Ejercicios generales de rodilla (Generic Knee Exercises) EJERCICIOS EJERCICIOS DE AMPLITUD DE MOVIMIENTOS Y ELONGACIN Estos ejercicios lo ayudarn al comienzo de la rehabilitacin. Los sntomas podrn aliviarse con o sin asistencia adicional de su mdico, fisioterapeuta o Herbalist. Al completar estos ejercicios, recuerde:   Restaurar la flexibilidad del tejido ayuda a que las articulaciones recuperen el movimiento normal. Esto permite que el movimiento y la actividad sean ms saludables y menos dolorosos.  Para que sea efectiva, cada elongacin debe realizarse durante al menos 30 segundos.  La elongacin nunca debe ser dolorosa. Deber sentir slo un alargamiento o distensin suave del tejido que estira. ELONGACIN Extensin de la rodilla - posicin prona  Recustese sobre el estmago sobre una superficie firme, como una cama o la mesada de la cocina. Coloque la rodilla y la pierna derecha / izquierdo ms all del borde de la superficie. Podr colocar una toalla bajo el extremo ms alejado del muslo derecha / izquierdo para mayor comodidad.  Relaje los msculos de las piernas y permita que la  gravedad enderece la rodilla. El mdico podr indicarle que coloque un peso en el tobillo, si una mayor resistencia podra beneficiarlo.  Debe sentir un estiramiento en la parte trasera de la rodilla derecha / izquierdo. Mantenga esta posicin durante __________ segundos. Reptalo __________ veces. Realice este estiramiento __________ Anthoney Harada por da. *El mdico, fisioterapeuta o Herbalist podrn indicarle que aada un peso para hacer ms efectiva la elongacin.  AMPLITUD DE MOVIMIENTOS Flexin de la rodilla - activa  Recustese sobre la espalda con las rodillas rectas. (Si esto le ocasiona molestias en la espalda, doble la rodilla opuesta, apoyando el pie en el suelo).  Lentamente deslice el taln hacia sus nalgas, hasta que sienta una sensacin de estiramiento en la zona de la rodilla o el muslo.  Mantenga esta posicin durante __________ segundos. Vuelva lentamente el taln hasta la posicin inicial. Reptalo __________ veces. Realice este ejercicio __________ veces por da.  ELONGACIN Cudriceps - posicin prona  Recustese sobre el estmago sobre una superficie firme, como una cama o sobre el piso en el que haya colocado [place].  Doble la rodilla derecha / izquierdo y tmese del tobillo. Si no puede alcanzar el tobillo o la pierna del pantaln, use un cinturn alrededor del pie para Copywriter, advertising.  Empuje lentamente el taln hacia las nalgas. La rodilla no debe desplazase hacia un lado. Debe sentir un estiramiento en la parte anterior del muslo o de la rodilla.  Mantenga esta posicin durante __________ segundos. Reptalo __________ veces. Realice este estiramiento __________ Anthoney Harada por da.  ELONGACIN - Isquiosurales - posicin supina  Acustese sobre la espada. Coloque un cinturn o Neomia Dear  toalla alrededor del tarso de su pie derecha / izquierdo.  Enderece su rodilla derecha / izquierdo y tire lentamente del cinturn para elevar la pierna. No deje que la rodilla derecha /  izquierdo se doble. Mantenga la otra pierna plana contra el suelo.  Eleve la pierna hasta sentir un ligero estiramiento detrs de la rodilla o muslo derecha / izquierdo. Mantenga esta posicin durante __________ segundos. Reptalo __________ veces. Realice este estiramiento __________ Anthoney Haradaveces por da.  EJERCICIOS FORTALECIMIENTO Estos ejercicios lo ayudarn al comienzo de la rehabilitacin. Los sntomas podrn aliviarse con o sin asistencia adicional de su mdico, fisioterapeuta o Herbalistentrenador. Al completar estos ejercicios, recuerde:   Los msculos pueden ganar tanto la resistencia como la fortaleza que necesita para sus actividades diarias a travs de ejercicios controlados.  Realice los ejercicios como se lo indic el mdico, el fisioterapeuta o Orthoptistel entrenador. Aumente la resistencia y las repeticiones segn se le haya indicado.  Podr experimentar dolor o cansancio muscular, pero el dolor o molestia que trata de eliminar a travs de los ejercicios nunca debe empeorar. Si el dolor empeora, detngase y asegrese de que est siguiendo las directivas correctamente. Si an siente dolor luego de Education officer, environmentalrealizar lo ajustes necesarios, deber discontinuar el ejercicio hasta que pueda conversar con el profesional sobre el problema. FUERZA Cudriceps - isometra  Recustese sobre la espalda con su pierna derecha / izquierdo extendida y la rodilla opuesta doblada.  Tensione gradualmente los msculos de la zona anterior del muslo derecha / izquierdo. Ver que la rtula se desliza hacia arriba o que se intensifica el hoyuelo que se encuentra justo por arriba de la rodilla. Este movimiento empujar nuevamente la rodilla hacia abajo en direccin al piso, Sey[place]manta o cama sobre la cual est recostado.  Sostenga de ese modo el msculo, tan tensionado como pueda, sin que Wells Fargoaumente el dolor, durante __________ segundos.  Relaje los msculos lenta y completamente entre cada repeticin. Reptalo __________ veces. Realice este  ejercicio __________ veces por da.  FUERZA Cudriceps - arcos cortos  Acustese Hartford Financialsobre la espada. Coloque una toalla enrollada de __________ pulgadas debajo de su rodilla, de modo que se doble ligeramente.  Eleve slo la parte inferior de la pierna mediante la tensin de los msculos de la parte frontal del muslo . No permita que el muslo se eleve.  Mantenga esta posicin durante __________ segundos. Reptalo __________ veces. Realice este ejercicio __________ veces por da.  PESO OPCIONAL EN EL TOBILLO: Comience con ____________________, pero no se exceda de ____________________. Aumente de a 1 libra/0.5 kilograme.    Calambres y espasmos musculares  (Muscle Cramps and Spasms)  Los calambres musculares y espasmos ocurren cuando un msculo o grupos de msculos se tensan y no se tiene control sobre esta tensin (contraccin muscular involuntaria). Es un problema comn y Software engineerpuede aparecer en cualquier msculo. La zona ms comn son los msculos de la pantorrilla. Tanto los Liberty Globalcalambres como los espasmos son contracciones musculares involuntarias, pero tambin tienen diferencias:   Los calambres musculares son espordicos y Engineer, miningdolorosos. Pueden durar entre algunos segundos hasta un cuarto de [place]. Los calambres musculares son ms fuertes y duran ms que los espasmos musculares.  Los espasmos pueden o no ser dolorosos. Pueden durar algunos segundos o mucho ms. CAUSAS  No es frecuente que los calambres se deban a un trastorno subyacente grave. En muchos casos, la causa de los calambres y los espasmos es desconocida. Algunas causas frecuentes son:   Esfuerzo excesivo.   El uso excesivo del msculo por movimientos  repetitivos (hacer lo mismo una y Laverda Page).   Permanecer en cierta posicin durante un largo perodo de [place].   Preparacin, forma o tcnica inadecuada al realizar un deporte o [place].   Deshidratacin.   Traumatismos.   Efectos secundarios de algunos  medicamentos.  Niveles anormalmente bajos de las sales e iones en la sangre (electrolitos), especialmente el potasio y el calcio. Pueden ocurrir cuando se toman pldoras para Geographical information systems officer (diurticos) o en las mujeres embarazadas.  Algunos problemas mdicos subyacentes pueden hacer que sea ms propenso a desarrollar calambres o espasmos. Estos incluyen, pero no se limitan a:   Diabetes.   Enfermedad de Parkinson.   Trastornos hormonales, tales como problemas de la tiroides.   El consumo excesivo de alcohol.   Enfermedades especficas de [employer], las articulaciones y [place].   Enfermedad vascular en la que no llega suficiente sangre a los msculos.  INSTRUCCIONES PARA EL CUIDADO EN EL HOGAR   Mantngase bien hidratado. Beba gran cantidad de lquido para mantener la orina de tono claro o color amarillo plido.  Puede ser til Engineer, maintenance (IT), Therapist, music y International aid/development worker el msculo afectado.  Para los msculos tensos o apretados, use una toalla caliente, una almohadilla trmica o agua caliente de la ducha dirigida a la zona afectada.  Si est dolorido o siente dolor despus de un calambre o espasmo, aplique hielo en el rea afectada para Acupuncturist.  Ponga el hielo en una bolsa plstica.  Colquese una toalla entre la piel y la bolsa de hielo.  Deje el hielo en el lugar durante 15 a 20 minutos, 3 a 4 veces por da.  Los medicamentos que se utilizan para tratar las causas conocidas de los calambres o espasmos pueden reducir su frecuencia o gravedad. Tome slo medicamentos de venta libre o recetados, segn las indicaciones del mdico. SOLICITE ATENCIN MDICA SI:  Los calambres o espasmos empeoran, ocurren con ms frecuencia o no mejoran con Museum/gallery conservator.  ASEGRESE DE QUE:   Comprende estas instrucciones.  Controlar su enfermedad.  Solicitar ayuda de inmediato si no mejora o si empeora.   Esta informacin no tiene Theme park manager el consejo del mdico. Asegrese de hacerle  al mdico cualquier pregunta que tenga.   Document Released: 03/07/2005 Document Revised: 09/22/2012 Elsevier Interactive Patient Education 2016 [employer].  Osteoartritis (Osteoarthritis) La osteoartritis es una enfermedad que provoca dolor e inflamacin en las articulaciones. Ocurre cuando el cartlago de la articulacin afectada se desgasta. El cartlago acta como una almohadilla que cubre los extremos de los huesos que forman una articulacin. La osteoartritis es la ms frecuente de reumatismo articular. Afecta a menudo a los ancianos. Las articulaciones que se ven ms afectadas por esta afeccin son las que se encuentran en las siguientes zonas:  Los extremos de los dedos.  Los pulgares.  El cuello.  La parte inferior de la espalda.  Las rodillas.  Las caderas CAUSAS  Con el paso del [place], el cartlago que recubre los extremos de los huesos comienza a Acupuncturist. Esto provoca friccin [employer] Corporation, lo que causa dolor y entumecimiento en las articulaciones afectadas.  FACTORES DE RIESGO Ciertos factores pueden aumentar las probabilidades de padecer osteoartritis, incluidos los siguientes:  Edad avanzada.  Exceso de Runner, broadcasting/film/video.  Uso excesivo de la articulacin.  Lesin previa en la articulacin. SIGNOS Y SNTOMAS   Dolor, hinchazn y entumecimiento en la articulacin.  Con el tiempo, la articulacin pierde su forma normal.  Pueden formarse pequeos depsitos de hueso (ostefitos)  en los extremos de la articulacin.  Algunos trozos de Dow [employer] o cartlago pueden separarse y flotar dentro del espacio de la articulacin. Esto puede causar ms dolor y lesiones. DIAGNSTICO  El mdico le preguntar acerca de sus sntomas y le har un examen fsico. Le indicarn varios estudios, como:  Radiografas de Statistician.  Anlisis de sangre para descartar otros tipos de artritis. Pueden usarse pruebas adicionales para diagnosticar la  enfermedad. TRATAMIENTO  Los PepsiCo del tratamiento son Human resources officer y mejorar el funcionamiento de Nurse, learning disability. Los planes de tratamiento pueden incluir lo siguiente:  Un programa de ejercicios recomendado que permita el descanso y el alivio de la articulacin.  Un plan de control del peso.  Tcnicas de Occidental Petroleum, como las siguientes:  Aplicacin correcta de fro y Airline pilot.  Impulsos elctricos enviados a las terminaciones nerviosas que se encuentran debajo de la piel (neuroestimulacin elctrica transcutnea [TENS]).  Masajes.  Ciertos suplementos nutricionales.  Medicamentos para Human resources officer como:  Paracetamol.  Antiinflamatorios no esteroides (AINE), como el naproxeno.  Narcticos o agentes de accin central, como el tramadol.  Corticoides. Estos se pueden administrar por va oral o mediante una inyeccin.  Ciruga para reposicionar los TransMontaigne y Engineer, materials (osteotoma) o para retirar las piezas sueltas de hueso y TEFL teacher. Puede ser necesario el reemplazo de las articulaciones en estadios avanzados de la enfermedad. INSTRUCCIONES PARA EL CUIDADO EN EL HOGAR   Tome los medicamentos solamente como se lo haya indicado el mdico.  Mantenga un peso saludable. Siga las instrucciones del mdico con respecto al control del [place]. Esto puede incluir instrucciones Software engineer.  Practique los ejercicios que le indiquen. Es posible que el mdico le recomiende tipos especficos de ejercicios. Estos pueden incluir los siguientes:  Ejercicios de fortalecimiento Se realizan para fortalecer los msculos que sostienen las articulaciones afectadas por la artritis. Pueden realizarse con peso o con bandas para agregar resistencia.  Actividades Rhona Raider. Son Programmer, applications a paso ligero, gimnasia [place] de bajo impacto, que acelere el corazn.  Actividades de amplitud de movimientos. Dan agilidad a las articulaciones.  Ejercicios de equilibrio y  [place]. Ayudan a McKesson se necesitan para la vida diaria.  Haga descansar a las articulaciones segn las indicaciones del mdico.  Concurra a todas las visitas de control como se lo haya indicado el mdico. SOLICITE ATENCIN MDICA SI:   La piel se pone roja.  Aparece una erupcin adems del dolor en la articulacin.  El dolor en la articulacin empeora.  Tiene fiebre y siente dolor en la articulacin o el msculo. SOLICITE ATENCIN MDICA DE INMEDIATO SI:  Nota una prdida importante de peso o del apetito.  Tiene transpiracin nocturna. PARA Parthenia Ames MS INFORMACIN   [employer] de Artritis y Event organiser Musculoesquelticas y Dermatolgicas [facility] of Arthritis and Musculoskeletal and Skin Diseases): www.niams.http://www.myers.net/.  Instituto [employer] el Envejecimiento ([employer] on Aging): https://walker.com/.  Instituto Norte[facility]ano de Advice worker of Rheumatology): www.rheumatology.org.   Esta informacin no tiene como fin reemplazar el consejo del mdico. Asegrese de hacerle al mdico cualquier pregunta que tenga.   Document Released: 03/07/2005 Document Revised: 06/18/2014 Elsevier Interactive Patient Education [employer].

## 2015-05-27 NOTE — Assessment & Plan Note (Signed)
Patient unaware of diabetes diagnosis with previous A1c of 7.4. Obtain A1c and basic metabolic panel to check current status. Treatment pending A1c results.

## 2015-05-27 NOTE — Progress Notes (Signed)
Pre visit review using our clinic review tool, if applicable. No additional management support is needed unless otherwise documented below in the visit note. 

## 2015-05-27 NOTE — Progress Notes (Signed)
Subjective:    Patient ID: Edward Mcgee, male    DOB: [birth year], 59 y.o.   MRN: [id number]  Chief Complaint  Patient presents with  . Establish Care    having pain in right leg, starts a little bit above the knee and goes down to the middle of lower leg, on and off for 2 years    HPI:  Edward Mcgee is a 59 y.o. male who  has a past medical history of DM (diabetes mellitus), type 2, uncontrolled (HCC) (02/10/2013) and GERD (gastroesophageal reflux disease). and presents today for an office visit to establish care.   1.) Diabetes - Previously diagnosed with diabetes with A1c of 7.4. Not currently taking any medication.   Lab Results  Component Value Date   HGBA1C 7.4* 02/09/2013   2.) Leg pain - Associated symptoms of pain located in his right leg starting a little about the knee and going down to the middle of his lower leg has been going on for about 2 years. Pain is described as pulsating and feels like a deep bone pain. No trauma that he is aware of.  Modifying factors include Advil which does help with the pain. Also describes a cramping pain. Cramps are usually after he is done working.   No Known Allergies   Outpatient Prescriptions Prior to Visit  Medication Sig Dispense Refill  . fish oil-omega-3 fatty acids 1000 MG capsule Take 1 g by mouth daily.    . Glucosamine HCl (GLUCOSAMINE PO) Take 1 tablet by mouth daily.    Marland Kitchen ibuprofen ([facility] IBUPROFEN) 200 MG tablet Take 2-3 tablets (400-600 mg total) by mouth every 6 (six) hours as needed for pain (every 6-8 hrs as needed for chest pain). 30 tablet 0  . MAGNESIUM PO Take 1 tablet by mouth daily.    Marland Kitchen omeprazole (PRILOSEC) 20 MG capsule Take 20 mg by mouth daily as needed (gerd).     No facility-administered medications prior to visit.     Past Medical History  Diagnosis Date  . DM (diabetes mellitus), type 2, uncontrolled (HCC) 02/10/2013  . GERD (gastroesophageal reflux disease)      Past Surgical History  Procedure  Laterality Date  . No past surgeries       Family History  Problem Relation Age of Onset  . Breast cancer Sister   . Breast cancer Sister      Social History   Social History  . Marital Status: Married    Spouse Name: N/A  . Number of Children: 3  . Years of Education: 11   Occupational History  . Maintenance    Social History Main Topics  . Smoking status: Never Smoker   . Smokeless tobacco: Never Used  . Alcohol Use: 3.6 oz/week    6 Cans of beer per week  . Drug Use: No  . Sexual Activity: Not on file   Other Topics Concern  . Not on file   Social History Narrative     Review of Systems  Constitutional: Negative for fever and chills.  Respiratory: Negative for chest tightness and shortness of breath.   Cardiovascular: Negative for chest pain, palpitations and leg swelling.  Musculoskeletal:       Positive for right leg pain.       Objective:    BP 124/84 mmHg  Pulse 71  Temp(Src) 98.2 F (36.8 C) (Oral)  Resp 18  Ht  (1.727 m)  Wt 150 lb 12.8 oz (68.402 kg)  BMI 22.93 kg/m2  SpO2 98% Nursing note and vital signs reviewed.  Physical Exam  Constitutional: He is oriented to person, place, and time. He appears well-developed and well-nourished. No distress.  Cardiovascular: Normal rate, regular rhythm, normal heart sounds and intact distal pulses.   Pulmonary/Chest: Effort normal and breath sounds normal.  Musculoskeletal:  Right knee - no obvious deformity, discoloration, or edema noted. No specific tenderness able to be elicited. Range of motion is full in flexion/extension. There is discomfort with resisted range of motion. Ligamentous and meniscal testing is negative. Distal pulses and sensation are intact and appropriate.   Neurological: He is alert and oriented to person, place, and time.  Skin: Skin is warm and dry.  Psychiatric: He has a normal mood and affect. His behavior is normal. Judgment and thought content normal.         Assessment & Plan:   Problem List Items Addressed This Visit      Endocrine   DM (diabetes mellitus), type 2, uncontrolled (HCC)    Patient unaware of diabetes diagnosis with previous A1c of 7.4. Obtain A1c and basic metabolic panel to check current status. Treatment pending A1c results.      Relevant Orders   Basic Metabolic Panel (BMET)   Hemoglobin A1c     Other   Muscle cramps    Muscle cramps of undetermined origin. Obtain basic metabolic panel and magnesium to check for electrolyte imbalances. Cannot rule out underlying vascular issues. Continue current dosage of magnesium supplementation. Follow-up pending blood work for potential further workup if necessary. Instructed to start home exercise therapy with stretching and heat as needed.      Relevant Orders   Magnesium   Basic Metabolic Panel (BMET)   Right knee pain - Primary    Right knee pain consistent with osteoarthritis. Start Tumeric for inflammation. Continue OTC medications as needed for symptom relief and supportive care. Knee sleeve as needed. Home exercise therapy initiated. Follow-up if symptoms worsen or fail to improve.

## 2015-05-29 ENCOUNTER — Telehealth: Payer: Self-pay | Admitting: Family

## 2015-05-29 MED ORDER — METFORMIN HCL 500 MG PO TABS: 500.0000 mg | ORAL_TABLET | Freq: Two times a day (BID) | ORAL | Status: DC

## 2015-05-29 NOTE — Telephone Encounter (Signed)
Please inform patient that his magnesium and electrolytes are within the normal limits, therefore the next step would be to evaluate his blood vessels as a potential cause for his cramps. If he wishes I will put the order in to complete them. As for his A1c, it was 7.7 which remains consistent with diabetes. I have sent in a prescription for metformin to help to control his blood sugars. This mediation does have the side effects of some stomach discomfort and looser stools when started but usually improves within a few days to a week. Please have him follow up in about 1 month after starting the medication to complete his other diabetic prevention.

## 2015-06-01 NOTE — Telephone Encounter (Signed)
Sending results in the mail. 

## 2015-06-29 LAB — HM DIABETES EYE EXAM

## 2015-06-30 ENCOUNTER — Encounter: Payer: Self-pay | Admitting: Family

## 2015-07-18 ENCOUNTER — Encounter: Payer: Self-pay | Admitting: Family

## 2015-12-05 ENCOUNTER — Ambulatory Visit (INDEPENDENT_AMBULATORY_CARE_PROVIDER_SITE_OTHER): Payer: Commercial Managed Care - HMO | Admitting: Family

## 2015-12-05 ENCOUNTER — Encounter: Payer: Self-pay | Admitting: Family

## 2015-12-05 ENCOUNTER — Other Ambulatory Visit: Payer: Commercial Managed Care - HMO

## 2015-12-05 VITALS — BP 120/80 | HR 58 | Temp 97.9°F | Resp 16 | Ht 68.0 in | Wt 157.0 lb

## 2015-12-05 DIAGNOSIS — R300 Dysuria: Secondary | ICD-10-CM

## 2015-12-05 DIAGNOSIS — Z23 Encounter for immunization: Secondary | ICD-10-CM

## 2015-12-05 DIAGNOSIS — N50819 Testicular pain, unspecified: Secondary | ICD-10-CM

## 2015-12-05 LAB — POCT URINALYSIS DIPSTICK
BILIRUBIN UA: NEGATIVE
KETONES UA: NEGATIVE
Leukocytes, UA: NEGATIVE
Nitrite, UA: NEGATIVE
PH UA: 6
PROTEIN UA: NEGATIVE
RBC UA: NEGATIVE
Spec Grav, UA: >=1.03
Urobilinogen, UA: NEGATIVE

## 2015-12-05 NOTE — Patient Instructions (Addendum)
Thank you for choosing [facility].  Summary/Instructions:  They will call to schedule your ultrasound.  Try wearing underwear briefs to support your testicles.   If your symptoms worsen or fail to improve, please contact our office for further instruction, or in case of emergency go directly to the emergency room at the closest medical facility.   Varicocele (Varicocele) El varicocele es la hinchazn de las venas del escroto. El escroto es la bolsa donde se encuentran los testculos. El varicocele puede producirse en cualquiera de los costados del escroto, pero es ms frecuente del lado izquierdo. Este trastorno ocurre con mayor frecuencia en adolescentes y [place]adultos jvenes. En la International Business Machinesmayora de los casos, el varicocele no es un problema grave. Por lo general, es pequeo e indoloro, y no requiere TEFL teachertratamiento. Pueden hacerse estudios para Optometristconfirmar el diagnstico. El tratamiento es necesario en los siguientes casos:  El varicocele es [place], causa mucho dolor o causa dolor al hacer ejercicio.  Ambos costados del escroto presentan un varicocele.  El testculo del lado opuesto est ausente o no es normal.  El varicocele causa una reduccin del tamao del testculo en un adolescente en crecimiento.  El hombre presenta problemas de fertilidad. CAUSAS Esta afeccin se presenta como consecuencia del mal funcionamiento de las vlvulas de las venas, las que ayudan a que la sangre retorne desde el escroto y los testculos hasta el corazn. Si estas vlvulas no funcionan como corresponde, la sangre retrocede y se acumula en las venas, lo que hace que se hinchen. Esto es semejante a lo que se produce cuando se forman venas varicosas en las piernas. SNTOMAS La mayora de los varicoceles no causan sntomas. Si hay sntomas, podrn ser los siguientes:  Hinchazn en un costado del escroto. La hinchazn puede ser ms evidente al estar de pie.  Sensacin de Warehouse managertener un bulto en el escroto.  Sensacin  de Warehouse managertener un peso en un costado del escroto.  Un dolor sordo en el escroto, especialmente despus de hacer ejercicio o estar de pie o sentado durante un tiempo prolongado.  Desarrollo ms lento del testculo del lado del varicocele o reduccin de su tamao (en adultos jvenes).  Problemas de fertilidad. Estos pueden surgir si el testculo no se desarrolla normalmente. DIAGNSTICO Esta afeccin se puede diagnosticar mediante un examen fsico. Tambin pueden hacerle una ecografa para confirmar el diagnstico y descartar otras causas de la hinchazn. TRATAMIENTO Generalmente, no se requiere un tratamiento para este trastorno. Si siente dolor, el mdico puede recetarle o recomendarle un medicamento para aliviarlo. Quizs deba hacerse exmenes con regularidad para que el mdico supervise el varicocele y se asegure de que no le cause problemas. En caso de que sea necesario tratamiento adicional, puede incluir lo siguiente:  NeurosurgeonVaricocelectoma. En esta ciruga, las venas hinchadas se ligan para que el flujo de [place] se dirija hacia otras venas.  Embolizacin. En este procedimiento, se utiliza un pequeo tubo (catter) para Optometristcolocar alambres de metal u otros elementos que M.D.C. Holdingsobstruyan las venas. Esto interrumpe el flujo de Albertson'ssangre hacia las venas hinchadas. INSTRUCCIONES PARA EL CUIDADO EN EL HOGAR  Tome los medicamentos solamente como se lo haya indicado el mdico.  Use ropa interior de soporte.  Use un sujetador deportivo cuando practique un deporte.  Concurra a todas las visitas de control como se lo haya indicado el mdico. Esto es importante. SOLICITE ATENCIN MDICA SI:  El Human resources officerdolor aumenta.  Tiene enrojecimiento en el rea afectada.  Tiene hinchazn que no disminuye al Tribune Companyestar acostado.  Juanna CaoUno  de sus testculos es ms pequeo que el otro.  El testculo se agranda, se hincha o le duele.   Esta informacin no tiene Theme park managercomo fin reemplazar el consejo del mdico. Asegrese de hacerle al mdico  cualquier pregunta que tenga.   Document Released: 03/07/2005 Document Revised: 10/12/2014 Elsevier Interactive Patient Education [employer]2016 Elsevier Inc.

## 2015-12-05 NOTE — Progress Notes (Signed)
Pre visit review using our clinic review tool, if applicable. No additional management support is needed unless otherwise documented below in the visit note. 

## 2015-12-05 NOTE — Assessment & Plan Note (Signed)
Testicular tenderness with concern for possible variocele or questionable hernia based on patient description. UA is negative for nitrites, leukocytes and hematuria. No evidence of torsion or masses noted. Obtain ultrasound. Encouraged supportive underwear. OTC medications as needed for the discomfort. Return precautions provided. Follow up pending ultrasound results.

## 2015-12-05 NOTE — Progress Notes (Signed)
Subjective:    Patient ID: Edward Mcgee, male    DOB: 30-Nov-1955, 60 y.o.   MRN: [id number]  Chief Complaint  Patient presents with  . Testicle Pain    having pain when touch area, pain with urination, x4 months feels like there is a little ball in there    HPI:  Edward Mcgee is a 60 y.o. male who  has a past medical history of DM (diabetes mellitus), type 2, uncontrolled (HCC) (02/10/2013) and GERD (gastroesophageal reflux disease). and presents today for an office visit.  This is a new problem. Associated symptom of pain located in his testicle that has been going on for about 4 months. Described as tenderness to the touch, some mild dysuria and believes there may be a mass. Not specific to either side. Denies any trauma. Believes the mass is located on both sides and also changes sides. Pain is described as achy. He currently wears boxers.    No Known Allergies   Current Outpatient Prescriptions on File Prior to Visit  Medication Sig Dispense Refill  . fish oil-omega-3 fatty acids 1000 MG capsule Take 1 g by mouth daily.    . Glucosamine HCl (GLUCOSAMINE PO) Take 1 tablet by mouth daily.    Marland Kitchen. ibuprofen ([facility]S IBUPROFEN) 200 MG tablet Take 2-3 tablets (400-600 mg total) by mouth every 6 (six) hours as needed for pain (every 6-8 hrs as needed for chest pain). 30 tablet 0  . MAGNESIUM PO Take 1 tablet by mouth daily.    . metFORMIN (GLUCOPHAGE) 500 MG tablet Take 1 tablet (500 mg total) by mouth 2 (two) times daily with a meal. 60 tablet 2  . omeprazole (PRILOSEC) 20 MG capsule Take 20 mg by mouth daily as needed (gerd).     No current facility-administered medications on file prior to visit.    Past Medical History  Diagnosis Date  . DM (diabetes mellitus), type 2, uncontrolled (HCC) 02/10/2013  . GERD (gastroesophageal reflux disease)      Review of Systems  Constitutional: Negative for fever and chills.  Genitourinary: Positive for dysuria and testicular pain. Negative for  urgency, frequency, decreased urine volume, penile swelling, scrotal swelling, difficulty urinating and penile pain.      Objective:    BP 120/80 mmHg  Pulse 58  Temp(Src) 97.9 F (36.6 C) (Oral)  Resp 16  Ht 5\' 8"  (1.727 m)  Wt 157 lb (71.215 kg)  BMI 23.88 kg/m2  SpO2 99% Nursing note and vital signs reviewed.  Physical Exam  Constitutional: He is oriented to person, place, and time. He appears well-developed and well-nourished. No distress.  Cardiovascular: Normal rate, regular rhythm, normal heart sounds and intact distal pulses.   Pulmonary/Chest: Effort normal and breath sounds normal.  Genitourinary: Penis normal. Right testis shows tenderness. Right testis shows no mass and no swelling. Left testis shows no mass, no swelling and no tenderness. Circumcised.  Neurological: He is alert and oriented to person, place, and time.  Skin: Skin is warm and dry.  Psychiatric: He has a normal mood and affect. His behavior is normal. Judgment and thought content normal.       Assessment & Plan:   Problem List Items Addressed This Visit      Other   Testicular pain - Primary    Testicular tenderness with concern for possible variocele or questionable hernia based on patient description. UA is negative for nitrites, leukocytes and hematuria. No evidence of torsion or masses noted. Obtain ultrasound. Encouraged  supportive underwear. OTC medications as needed for the discomfort. Return precautions provided. Follow up pending ultrasound results.       Relevant Orders   US Scrotum    Other Visit Diagnoses    Dysuria        Relevant Orders    POCT urinalysis dipstick (Completed)    Urine culture    Need for shingles vaccine        Relevant Orders    Varicella-zoster vaccine subcutaneous (Completed)        I am having Mr. [name] maintain his fish oil-omega-3 fatty acids, MAGNESIUM PO, omeprazole, Glucosamine HCl (GLUCOSAMINE PO), ibuprofen, and metFORMIN.   Follow-up: Return  if symptoms worsen or fail to improve.  Jeanine Luzalone, Gregory, FNP

## 2015-12-06 LAB — URINE CULTURE
Colony Count: NO GROWTH
ORGANISM ID, BACTERIA: NO GROWTH

## 2015-12-15 ENCOUNTER — Telehealth: Payer: Self-pay | Admitting: Family

## 2015-12-15 DIAGNOSIS — N50819 Testicular pain, unspecified: Secondary | ICD-10-CM

## 2015-12-15 NOTE — Telephone Encounter (Signed)
[facility] called stating they needing IMG [id number] added to his US Scrotum °

## 2015-12-16 NOTE — Telephone Encounter (Signed)
Order has been put in 

## 2015-12-26 ENCOUNTER — Ambulatory Visit
Admission: RE | Admit: 2015-12-26 | Discharge: 2015-12-26 | Disposition: A | Discharge status: Home / Self Care | Payer: Commercial Managed Care - HMO | Source: Ambulatory Visit | Attending: Family | Admitting: Family

## 2015-12-26 ENCOUNTER — Other Ambulatory Visit: Payer: Self-pay | Admitting: Family

## 2015-12-26 ENCOUNTER — Encounter: Payer: Self-pay | Admitting: Family

## 2015-12-26 DIAGNOSIS — N50819 Testicular pain, unspecified: Secondary | ICD-10-CM

## 2015-12-26 DIAGNOSIS — N442 Benign cyst of testis: Secondary | ICD-10-CM

## 2016-02-10 ENCOUNTER — Ambulatory Visit (INDEPENDENT_AMBULATORY_CARE_PROVIDER_SITE_OTHER): Payer: Commercial Managed Care - HMO | Admitting: Urology

## 2016-02-10 DIAGNOSIS — N4341 Spermatocele of epididymis, single: Secondary | ICD-10-CM | POA: Diagnosis not present

## 2016-02-10 DIAGNOSIS — R3911 Hesitancy of micturition: Secondary | ICD-10-CM

## 2016-02-10 DIAGNOSIS — N401 Enlarged prostate with lower urinary tract symptoms: Secondary | ICD-10-CM | POA: Diagnosis not present

## 2016-02-10 DIAGNOSIS — R3912 Poor urinary stream: Secondary | ICD-10-CM

## 2016-02-10 DIAGNOSIS — R350 Frequency of micturition: Secondary | ICD-10-CM

## 2016-03-30 ENCOUNTER — Ambulatory Visit (INDEPENDENT_AMBULATORY_CARE_PROVIDER_SITE_OTHER): Payer: Commercial Managed Care - HMO | Admitting: Urology

## 2016-03-30 DIAGNOSIS — R350 Frequency of micturition: Secondary | ICD-10-CM | POA: Diagnosis not present

## 2016-03-30 DIAGNOSIS — R3912 Poor urinary stream: Secondary | ICD-10-CM

## 2016-03-30 DIAGNOSIS — N401 Enlarged prostate with lower urinary tract symptoms: Secondary | ICD-10-CM

## 2016-04-28 ENCOUNTER — Ambulatory Visit (HOSPITAL_COMMUNITY)
Admission: EM | Admit: 2016-04-28 | Discharge: 2016-04-28 | Disposition: A | Discharge status: Home / Self Care | Payer: Commercial Managed Care - HMO | Attending: Internal Medicine | Admitting: Internal Medicine

## 2016-04-28 ENCOUNTER — Encounter (HOSPITAL_COMMUNITY): Payer: Self-pay | Admitting: Emergency Medicine

## 2016-04-28 DIAGNOSIS — K5909 Other constipation: Secondary | ICD-10-CM | POA: Diagnosis not present

## 2016-04-28 DIAGNOSIS — B349 Viral infection, unspecified: Secondary | ICD-10-CM | POA: Diagnosis not present

## 2016-04-28 MED ORDER — ACETAMINOPHEN 325 MG PO TABS
ORAL_TABLET | ORAL | Status: AC
  Filled 2016-04-28: qty 2

## 2016-04-28 MED ORDER — CITRATE OF MAGNESIA 1.745 GM/30ML PO SOLN: 1.7400 g | Freq: Once | ORAL | 1 refills | Status: AC

## 2016-04-28 MED ORDER — ACETAMINOPHEN 325 MG PO TABS
650.0000 mg | ORAL_TABLET | Freq: Once | ORAL | Status: AC
  Administered 2016-04-28: 650 mg via ORAL

## 2016-04-28 NOTE — ED Triage Notes (Signed)
Pt c/o fevers onset x3 days associated w/constipation x5 days, abd pain, BA, HA, diaphoresis  Taking acetaminophen w/temp relief.   A&O x4... NAD

## 2016-04-28 NOTE — ED Provider Notes (Signed)
CSN: [id number]     Arrival date & time 04/28/16  1735 History   None    Chief Complaint  Patient presents with  . Fever  . Constipation   (Consider location/radiation/quality/duration/timing/severity/associated sxs/prior Treatment) 60 y.o. male presents with generalized aches and pains, headache with fever and constipation  X thursday. Condition is acute  in nature. Condition is made better by nothing. Condition is made worse by nothing. Patient denies any releif from fever reducing medication at home.  Patient denies and nausea, vomitting or pain with urination. Wife at bedside states she is starting to have similar signs and symptoms       Past Medical History:  Diagnosis Date  . DM (diabetes mellitus), type 2, uncontrolled (HCC) 02/10/2013  . GERD (gastroesophageal reflux disease)    Past Surgical History:  Procedure Laterality Date  . NO PAST SURGERIES     Family History  Problem Relation Age of Onset  . Breast cancer Sister   . Breast cancer Sister    Social History  Substance Use Topics  . Smoking status: Never Smoker  . Smokeless tobacco: Never Used  . Alcohol use 3.6 oz/week    6 Cans of beer per week    Review of Systems  Constitutional: Positive for fever.  Gastrointestinal: Positive for abdominal pain.  Neurological: Positive for headaches.    Allergies  Patient has no known allergies.  Home Medications   Prior to Admission medications   Medication Sig Start Date End Date Taking? Authorizing Provider  acetaminophen (TYLENOL) 325 MG tablet Take 650 mg by mouth every 6 (six) hours as needed.   Yes Historical Provider, MD  fish oil-omega-3 fatty acids 1000 MG capsule Take 1 g by mouth daily.   Yes Historical Provider, MD  Glucosamine HCl (GLUCOSAMINE PO) Take 1 tablet by mouth daily.   Yes Historical Provider, MD  omeprazole (PRILOSEC) 20 MG capsule Take 20 mg by mouth daily as needed (gerd).   Yes Historical Provider, MD  ibuprofen ([facility]S IBUPROFEN) 200  MG tablet Take 2-3 tablets (400-600 mg total) by mouth every 6 (six) hours as needed for pain (every 6-8 hrs as needed for chest pain). 02/10/13   Calvert CantorSaima Rizwan, MD  Magnesium Citrate (CITRATE OF MAGNESIA) 1.745 GM/30ML SOLN Take 29.9 mLs (1.74 g total) by mouth once. 04/28/16 04/28/16  Alene MiresJennifer C Omohundro, NP  MAGNESIUM PO Take 1 tablet by mouth daily.    Historical Provider, MD  metFORMIN (GLUCOPHAGE) 500 MG tablet Take 1 tablet (500 mg total) by mouth 2 (two) times daily with a meal. 05/29/15   Veryl SpeakGregory D Calone, FNP   Meds Ordered and Administered this Visit   Medications  acetaminophen (TYLENOL) tablet 650 mg (650 mg Oral Given 04/28/16 1836)    BP 136/88 (BP Location: Left Arm)   Pulse 96   Temp 102.1 F (38.9 C) (Oral)   Resp 18   SpO2 96%  No data found.   Physical Exam  Constitutional: He is oriented to person, place, and time. He appears well-developed and well-nourished.  HENT:  Head: Normocephalic.  Right Ear: External ear normal.  Eyes: Conjunctivae are normal.  Neck: Normal range of motion.  Cardiovascular: Normal rate and regular rhythm.   Pulmonary/Chest: Effort normal and breath sounds normal.  Abdominal: He exhibits distension. He exhibits no mass. There is no tenderness. There is no rebound and no guarding.  Hyperactive bowel sounds  Musculoskeletal: Normal range of motion.  Neurological: He is alert and oriented to person, place,  and time.  Skin: Skin is warm and dry.  Psychiatric: He has a normal mood and affect.  Nursing note and vitals reviewed.   Urgent Care Course   Clinical Course     Procedures (including critical care time)  Labs Review Labs Reviewed - No data to display  Imaging Review No results found.       MDM   1. Other constipation   2. Viral illness        Alene MiresJennifer C Omohundro, NP 04/28/16 1901

## 2016-04-29 ENCOUNTER — Encounter (HOSPITAL_COMMUNITY): Payer: Self-pay | Admitting: Emergency Medicine

## 2016-04-29 ENCOUNTER — Emergency Department (HOSPITAL_COMMUNITY): Payer: Commercial Managed Care - HMO

## 2016-04-29 ENCOUNTER — Emergency Department (HOSPITAL_COMMUNITY)
Admission: EM | Admit: 2016-04-29 | Discharge: 2016-04-30 | Disposition: A | Discharge status: Home / Self Care | Payer: Commercial Managed Care - HMO | Attending: Emergency Medicine | Admitting: Emergency Medicine

## 2016-04-29 DIAGNOSIS — R509 Fever, unspecified: Secondary | ICD-10-CM | POA: Diagnosis present

## 2016-04-29 DIAGNOSIS — I440 Atrioventricular block, first degree: Secondary | ICD-10-CM | POA: Insufficient documentation

## 2016-04-29 DIAGNOSIS — R109 Unspecified abdominal pain: Secondary | ICD-10-CM | POA: Diagnosis not present

## 2016-04-29 DIAGNOSIS — R112 Nausea with vomiting, unspecified: Secondary | ICD-10-CM | POA: Diagnosis not present

## 2016-04-29 DIAGNOSIS — R1010 Upper abdominal pain, unspecified: Secondary | ICD-10-CM

## 2016-04-29 DIAGNOSIS — E1165 Type 2 diabetes mellitus with hyperglycemia: Secondary | ICD-10-CM | POA: Diagnosis not present

## 2016-04-29 DIAGNOSIS — J181 Lobar pneumonia, unspecified organism: Secondary | ICD-10-CM

## 2016-04-29 DIAGNOSIS — Z79899 Other long term (current) drug therapy: Secondary | ICD-10-CM | POA: Insufficient documentation

## 2016-04-29 DIAGNOSIS — J189 Pneumonia, unspecified organism: Secondary | ICD-10-CM | POA: Diagnosis not present

## 2016-04-29 LAB — URINALYSIS, ROUTINE W REFLEX MICROSCOPIC
BILIRUBIN URINE: NEGATIVE
Glucose, UA: 250 mg/dL — AB
KETONES UR: NEGATIVE mg/dL
Leukocytes, UA: NEGATIVE
Nitrite: NEGATIVE
PH: 6 (ref 5.0–8.0)
Protein, ur: NEGATIVE mg/dL

## 2016-04-29 LAB — CBC WITH DIFFERENTIAL/PLATELET
Basophils Absolute: 0 10*3/uL (ref 0.0–0.1)
Basophils Relative: 0 %
Eosinophils Absolute: 0 10*3/uL (ref 0.0–0.7)
Eosinophils Relative: 0 %
HCT: 40.5 % (ref 39.0–52.0)
Hemoglobin: 13.9 g/dL (ref 13.0–17.0)
Lymphocytes Relative: 10 %
Lymphs Abs: 1.2 10*3/uL (ref 0.7–4.0)
MCH: 28.1 pg (ref 26.0–34.0)
MCHC: 34.3 g/dL (ref 30.0–36.0)
MCV: 81.8 fL (ref 78.0–100.0)
Monocytes Absolute: 1.1 10*3/uL — ABNORMAL HIGH (ref 0.1–1.0)
Monocytes Relative: 9 %
Neutro Abs: 9.9 10*3/uL — ABNORMAL HIGH (ref 1.7–7.7)
Neutrophils Relative %: 81 %
Platelets: 233 10*3/uL (ref 150–400)
RBC: 4.95 MIL/uL (ref 4.22–5.81)
RDW: 12.2 % (ref 11.5–15.5)
WBC: 12.2 10*3/uL — ABNORMAL HIGH (ref 4.0–10.5)

## 2016-04-29 LAB — URINE MICROSCOPIC-ADD ON

## 2016-04-29 LAB — HEPATIC FUNCTION PANEL
ALT: 81 U/L — ABNORMAL HIGH (ref 17–63)
AST: 54 U/L — ABNORMAL HIGH (ref 15–41)
Albumin: 3.4 g/dL — ABNORMAL LOW (ref 3.5–5.0)
Alkaline Phosphatase: 102 U/L (ref 38–126)
Bilirubin, Direct: 0.1 mg/dL (ref 0.1–0.5)
Indirect Bilirubin: 0.4 mg/dL (ref 0.3–0.9)
Total Bilirubin: 0.5 mg/dL (ref 0.3–1.2)
Total Protein: 7.1 g/dL (ref 6.5–8.1)

## 2016-04-29 LAB — BASIC METABOLIC PANEL
Anion gap: 9 (ref 5–15)
BUN: 11 mg/dL (ref 6–20)
CO2: 27 mmol/L (ref 22–32)
Calcium: 8.9 mg/dL (ref 8.9–10.3)
Chloride: 97 mmol/L — ABNORMAL LOW (ref 101–111)
Creatinine, Ser: 1.08 mg/dL (ref 0.61–1.24)
GFR calc Af Amer: >60 mL/min (ref >60)
GFR calc non Af Amer: >60 mL/min (ref >60)
Glucose, Bld: 280 mg/dL — ABNORMAL HIGH (ref 65–99)
Potassium: 3.8 mmol/L (ref 3.5–5.1)
Sodium: 133 mmol/L — ABNORMAL LOW (ref 135–145)

## 2016-04-29 LAB — LIPASE, BLOOD: Lipase: 17 U/L (ref 11–51)

## 2016-04-29 LAB — LACTIC ACID, PLASMA: Lactic Acid, Venous: 1.5 mmol/L (ref 0.5–1.9)

## 2016-04-29 MED ORDER — IOPAMIDOL (ISOVUE-300) INJECTION 61%
INTRAVENOUS | Status: AC
  Administered 2016-04-29: 100 mL
  Filled 2016-04-29: qty 30

## 2016-04-29 MED ORDER — IBUPROFEN 400 MG PO TABS
600.0000 mg | ORAL_TABLET | Freq: Once | ORAL | Status: AC
  Administered 2016-04-29: 600 mg via ORAL

## 2016-04-29 MED ORDER — FENTANYL CITRATE (PF) 100 MCG/2ML IJ SOLN
50.0000 ug | Freq: Once | INTRAMUSCULAR | Status: AC
  Administered 2016-04-29: 50 ug via INTRAVENOUS
  Filled 2016-04-29: qty 2

## 2016-04-29 MED ORDER — SODIUM CHLORIDE 0.9 % IV BOLUS (SEPSIS)
1000.0000 mL | Freq: Once | INTRAVENOUS | Status: AC
  Administered 2016-04-29: 1000 mL via INTRAVENOUS

## 2016-04-29 MED ORDER — ONDANSETRON HCL 4 MG/2ML IJ SOLN
4.0000 mg | Freq: Once | INTRAMUSCULAR | Status: AC
  Administered 2016-04-29: 4 mg via INTRAVENOUS
  Filled 2016-04-29: qty 2

## 2016-04-29 MED ORDER — IOPAMIDOL (ISOVUE-300) INJECTION 61%
100.0000 mL | Freq: Once | INTRAVENOUS | Status: AC | PRN
  Administered 2016-04-29: 30 mL via INTRAVENOUS

## 2016-04-29 MED ORDER — IBUPROFEN 400 MG PO TABS
ORAL_TABLET | ORAL | Status: AC
  Administered 2016-04-29: 600 mg via ORAL
  Filled 2016-04-29: qty 2

## 2016-04-29 NOTE — ED Triage Notes (Signed)
Pt reports fever that started 4 days ago. Per pt highest temp at home was "107." Pt c/o R sided headache that started the same day as the fever. [facility].

## 2016-04-29 NOTE — ED Provider Notes (Addendum)
[facility] Provider Note   CSN: [id number] Arrival date & time: 04/29/16  1929     History   Chief Complaint Chief Complaint  Patient presents with  . Fever    HPI Edward Mcgee is a 60 y.o. male.  HPI Patient presents with concern of fever, abdominal pain, nausea, vomiting. Symptoms began about 4 days ago, have become worse in spite of resting, taking OTC medication for constipation. Patient saw urgent care providers 2 days ago, was given the instructions to take OTC medication. He notes that he has moderate nonradiating sore crampy sharp epigastric pain with nausea, vomiting, and decreased bowel movements over the past 4 days.   Past Medical History:  Diagnosis Date  . DM (diabetes mellitus), type 2, uncontrolled (HCC) 02/10/2013  . GERD (gastroesophageal reflux disease)     Patient Active Problem List   Diagnosis Date Noted  . Testicular pain 12/05/2015  . Muscle cramps 05/27/2015  . Right knee pain 05/27/2015  . AV block, 1st degree 02/10/2013  . DM (diabetes mellitus), type 2, uncontrolled (HCC) 02/10/2013  . Chest pain 02/09/2013  . LEG PAIN, RIGHT 04/18/2007  . HYPERLIPIDEMIA 02/25/2007  . DIABETES MELLITUS, BORDERLINE 07/09/2006  . BACK PAIN, LUMBAR, CHRONIC 05/31/2006    Past Surgical History:  Procedure Laterality Date  . NO PAST SURGERIES         Home Medications    Prior to Admission medications   Medication Sig Start Date End Date Taking? Authorizing Provider  acetaminophen (TYLENOL) 325 MG tablet Take 650 mg by mouth every 6 (six) hours as needed.   Yes Historical Provider, MD  fish oil-omega-3 fatty acids 1000 MG capsule Take 1 g by mouth daily.   Yes Historical Provider, MD  Glucosamine HCl (GLUCOSAMINE PO) Take 1 tablet by mouth daily.   Yes Historical Provider, MD  ibuprofen ([facility] IBUPROFEN) 200 MG tablet Take 2-3 tablets (400-600 mg total) by mouth every 6 (six) hours as needed for pain (every 6-8 hrs as needed for chest pain).  02/10/13  Yes Calvert CantorSaima Rizwan, MD  ranitidine (ZANTAC) 150 MG tablet Take 150 mg by mouth 2 (two) times daily.   Yes Historical Provider, MD    Family History Family History  Problem Relation Age of Onset  . Breast cancer Sister   . Breast cancer Sister     Social History Social History  Substance Use Topics  . Smoking status: Never Smoker  . Smokeless tobacco: Never Used  . Alcohol use 3.6 oz/week    6 Cans of beer per week     Allergies   Patient has no known allergies.   Review of Systems Review of Systems  Constitutional:       Per HPI, otherwise negative  HENT:       Per HPI, otherwise negative  Respiratory:       Per HPI, otherwise negative  Cardiovascular:       Per HPI, otherwise negative  Gastrointestinal: Positive for abdominal pain, nausea and vomiting.  Endocrine:       Negative aside from HPI  Genitourinary:       Neg aside from HPI   Musculoskeletal:       Per HPI, otherwise negative  Skin: Negative.   Neurological: Negative for syncope.     Physical Exam Updated Vital Signs BP (!) 152/105 (BP Location: Left Arm)   Pulse 110   Temp 100 F (37.8 C) (Oral)   Resp 18   Ht 5\' 4"  (1.626 m)  Wt 157 lb (71.2 kg)   SpO2 96%   BMI 26.95 kg/m   Physical Exam  Constitutional: He is oriented to person, place, and time. He appears well-developed. No distress.  HENT:  Head: Normocephalic and atraumatic.  Eyes: Conjunctivae and EOM are normal.  Cardiovascular: Normal rate and regular rhythm.   Pulmonary/Chest: Effort normal. No stridor. No respiratory distress.  Abdominal: He exhibits no distension. There is tenderness in the epigastric area.  Musculoskeletal: He exhibits no edema.  Neurological: He is alert and oriented to person, place, and time.  Skin: Skin is warm and dry.  Psychiatric: He has a normal mood and affect.  Nursing note and vitals reviewed.    ED Treatments / Results  Labs (all labs ordered are listed, but only abnormal results  are displayed) Labs Reviewed  CBC WITH DIFFERENTIAL/PLATELET - Abnormal; Notable for the following:       Result Value   WBC 12.2 (*)    Neutro Abs 9.9 (*)    Monocytes Absolute 1.1 (*)    All other components within normal limits  BASIC METABOLIC PANEL - Abnormal; Notable for the following:    Sodium 133 (*)    Chloride 97 (*)    Glucose, Bld 280 (*)    All other components within normal limits  LACTIC ACID, PLASMA  LACTIC ACID, PLASMA  HEPATIC FUNCTION PANEL  LIPASE, BLOOD    Procedures Procedures (including critical care time)  Medications Ordered in ED Medications  iopamidol (ISOVUE-300) 61 % injection (not administered)  sodium chloride 0.9 % bolus 1,000 mL (1,000 mLs Intravenous New Bag/Given 04/29/16 2028)  fentaNYL (SUBLIMAZE) injection 50 mcg (50 mcg Intravenous Given 04/29/16 2028)  ondansetron (ZOFRAN) injection 4 mg (4 mg Intravenous Given 04/29/16 2028)     Initial Impression / Assessment and Plan / ED Course  I have reviewed the triage vital signs and the nursing notes.  Pertinent labs & imaging results that were available during my care of the patient were reviewed by me and considered in my medical decision making (see chart for details).  Clinical Course    Initial labs notable for leukocytosis. Patient's temperature is borderline  10:00 PM Patient ambulatory   Given the abdominal pain, nausea, vomiting, CT scan is pending on sign out. Patient's care endorsed to Dr. Patria Maneampos, who will follow up on pending studies, disposition the patient.    Final Clinical Impressions(s) / ED Diagnoses  Abdominal pain Nausea and vomiting Fever   Gerhard Munchobert Coady Train, MD 04/29/16 [id number]    Gerhard Munchobert Anaelle Dunton, MD 04/29/16 2200

## 2016-04-30 DIAGNOSIS — J189 Pneumonia, unspecified organism: Secondary | ICD-10-CM | POA: Diagnosis present

## 2016-04-30 MED ORDER — AZITHROMYCIN 250 MG PO TABS
500.0000 mg | ORAL_TABLET | Freq: Once | ORAL | Status: AC
  Administered 2016-04-30: 500 mg via ORAL
  Filled 2016-04-30: qty 2

## 2016-04-30 MED ORDER — PANTOPRAZOLE SODIUM 40 MG PO TBEC: 40.0000 mg | DELAYED_RELEASE_TABLET | Freq: Every day | ORAL | 0 refills | Status: DC

## 2016-04-30 MED ORDER — SODIUM CHLORIDE 0.9 % IV SOLN: INTRAVENOUS | Status: DC

## 2016-04-30 MED ORDER — ACETAMINOPHEN 500 MG PO TABS
1000.0000 mg | ORAL_TABLET | Freq: Once | ORAL | Status: AC
  Administered 2016-04-30: 1000 mg via ORAL
  Filled 2016-04-30: qty 2

## 2016-04-30 MED ORDER — AZITHROMYCIN 500 MG IV SOLR: 500.0000 mg | Freq: Once | INTRAVENOUS | Status: DC

## 2016-04-30 MED ORDER — AZITHROMYCIN 250 MG PO TABS: 250.0000 mg | ORAL_TABLET | Freq: Every day | ORAL | 0 refills | Status: DC

## 2016-04-30 MED ORDER — ONDANSETRON 4 MG PO TBDP: 4.0000 mg | ORAL_TABLET | Freq: Three times a day (TID) | ORAL | 0 refills | Status: DC | PRN

## 2016-04-30 MED ORDER — DEXTROSE 5 % IV SOLN: 1.0000 g | Freq: Once | INTRAVENOUS | Status: DC

## 2016-04-30 MED ORDER — GI COCKTAIL ~~LOC~~
30.0000 mL | Freq: Once | ORAL | Status: AC
  Administered 2016-04-30: 30 mL via ORAL
  Filled 2016-04-30: qty 30

## 2016-04-30 NOTE — Discharge Instructions (Signed)
You may alternate between Tylenol 1000 mg every 6 hours as needed for fever and pain and ibuprofen 800 mg every 8 hours as needed for fever and pain. Please follow-up with your doctor in the next week for recheck. If you began feeling worse, have difficulty breathing, vomiting and cannot stop, chest pain, feel like he may pass out, please return to the hospital.

## 2016-04-30 NOTE — ED Provider Notes (Addendum)
12:10 AM  Assumed care from Dr. Patria Maneampos.  Pt is a 60 y.o. M with history of diabetes who presents to the emergency department with several days of fevers, right-sided abdominal pain, headache. Seen at urgent care and diagnosed with viral illness. Labs today show leukocytosis with left shift, mildly elevated AST and ALT was normal CT of abdomen and pelvis. Chest x-ray shows a right middle lobe pneumonia. There is a small pleural effusion. He denies any cough or shortness of breath. No neck pain or neck stiffness. No rash. I have reviewed patient's x-ray and do believe that this is a true pneumonia and the cause of his symptoms. Will treat with ceftriaxone and azithromycin. Patient and family are uncomfortable with plan for discharge home and would like admission. PCP is with [facility]. Will discuss with medicine.    12:28 AM  Discussed patient's case with hospitalist, Dr. Sharl MaLama.  Recommend admission to observation, medical bed.  I will place holding orders per their request. Patient and family (if present) updated with plan. Care transferred to hospitalist service.  I reviewed all nursing notes, vitals, pertinent old records, EKGs, labs, imaging (as available).    Layla MawKristen N Ward, DO 04/30/16 0029    12:39 AM  Pt and wife have now decided that they would like to go home. I feel this is a reasonable plan given he is hemodynamically stable, very well-appearing, tolerating liquids.  Lactate is normal.  I will discharge patient with prescription for azithromycin. First dose given in the emergency department. Discussed return precautions. Patient does have a PCP for follow-up. Discussed with him he has mildly elevated AST and ALT. Otherwise liver function test, lipase normal. Absolutely no tenderness on palpation of his abdomen at this time. Patient and wife are comfortable with this plan.   At this time, I do not feel there is any life-threatening condition present. I have reviewed and discussed all results  (EKG, imaging, lab, urine as appropriate) and exam findings with patient/family. I have reviewed nursing notes and appropriate previous records.  I feel the patient is safe to be discharged home without further emergent workup and can continue workup as an outpatient as needed. Discussed usual and customary return precautions. Patient/family verbalize understanding and are comfortable with this plan.  Outpatient follow-up has been provided. All questions have been answered.    Layla MawKristen N Ward, DO 04/30/16 [id number]

## 2016-04-30 NOTE — ED Notes (Signed)
Pt ambulatory to the waiting room. Pt verbalized understanding of discharge instructions. 

## 2016-05-07 ENCOUNTER — Emergency Department (HOSPITAL_COMMUNITY): Payer: Commercial Managed Care - HMO

## 2016-05-07 ENCOUNTER — Encounter (HOSPITAL_COMMUNITY): Payer: Self-pay | Admitting: Emergency Medicine

## 2016-05-07 ENCOUNTER — Emergency Department (HOSPITAL_COMMUNITY)
Admission: EM | Admit: 2016-05-07 | Discharge: 2016-05-07 | Disposition: A | Discharge status: Home / Self Care | Payer: Commercial Managed Care - HMO | Attending: Emergency Medicine | Admitting: Emergency Medicine

## 2016-05-07 DIAGNOSIS — Z79899 Other long term (current) drug therapy: Secondary | ICD-10-CM | POA: Diagnosis not present

## 2016-05-07 DIAGNOSIS — E119 Type 2 diabetes mellitus without complications: Secondary | ICD-10-CM | POA: Diagnosis not present

## 2016-05-07 DIAGNOSIS — K529 Noninfective gastroenteritis and colitis, unspecified: Secondary | ICD-10-CM | POA: Diagnosis not present

## 2016-05-07 DIAGNOSIS — R1084 Generalized abdominal pain: Secondary | ICD-10-CM | POA: Diagnosis present

## 2016-05-07 LAB — URINALYSIS, ROUTINE W REFLEX MICROSCOPIC
Bilirubin Urine: NEGATIVE
GLUCOSE, UA: 500 mg/dL — AB
Hgb urine dipstick: NEGATIVE
Ketones, ur: NEGATIVE mg/dL
LEUKOCYTES UA: NEGATIVE
Nitrite: NEGATIVE
PROTEIN: NEGATIVE mg/dL
SPECIFIC GRAVITY, URINE: 1.02 (ref 1.005–1.030)
pH: 6.5 (ref 5.0–8.0)

## 2016-05-07 LAB — COMPREHENSIVE METABOLIC PANEL
ALBUMIN: 3.7 g/dL (ref 3.5–5.0)
ALT: 79 U/L — ABNORMAL HIGH (ref 17–63)
ANION GAP: 10 (ref 5–15)
AST: 36 U/L (ref 15–41)
Alkaline Phosphatase: 191 U/L — ABNORMAL HIGH (ref 38–126)
BUN: 15 mg/dL (ref 6–20)
CHLORIDE: 97 mmol/L — AB (ref 101–111)
CO2: 26 mmol/L (ref 22–32)
Calcium: 9.3 mg/dL (ref 8.9–10.3)
Creatinine, Ser: 0.7 mg/dL (ref 0.61–1.24)
GFR calc non Af Amer: >60 mL/min (ref >60)
GLUCOSE: 306 mg/dL — AB (ref 65–99)
POTASSIUM: 4 mmol/L (ref 3.5–5.1)
SODIUM: 133 mmol/L — AB (ref 135–145)
Total Bilirubin: 0.6 mg/dL (ref 0.3–1.2)
Total Protein: 7.5 g/dL (ref 6.5–8.1)

## 2016-05-07 LAB — CBC WITH DIFFERENTIAL/PLATELET
BASOS PCT: 0 %
Basophils Absolute: 0 10*3/uL (ref 0.0–0.1)
Eosinophils Absolute: 0.1 10*3/uL (ref 0.0–0.7)
Eosinophils Relative: 0 %
HEMATOCRIT: 39.1 % (ref 39.0–52.0)
HEMOGLOBIN: 13.4 g/dL (ref 13.0–17.0)
LYMPHS ABS: 1.5 10*3/uL (ref 0.7–4.0)
LYMPHS PCT: 11 %
MCH: 27.8 pg (ref 26.0–34.0)
MCHC: 34.3 g/dL (ref 30.0–36.0)
MCV: 81.1 fL (ref 78.0–100.0)
MONOS PCT: 6 %
Monocytes Absolute: 0.9 10*3/uL (ref 0.1–1.0)
NEUTROS ABS: 11.6 10*3/uL — AB (ref 1.7–7.7)
NEUTROS PCT: 83 %
Platelets: 505 10*3/uL — ABNORMAL HIGH (ref 150–400)
RBC: 4.82 MIL/uL (ref 4.22–5.81)
RDW: 12.4 % (ref 11.5–15.5)
WBC: 14 10*3/uL — ABNORMAL HIGH (ref 4.0–10.5)

## 2016-05-07 LAB — I-STAT CG4 LACTIC ACID, ED: Lactic Acid, Venous: 1.05 mmol/L (ref 0.5–1.9)

## 2016-05-07 LAB — LIPASE, BLOOD: Lipase: 24 U/L (ref 11–51)

## 2016-05-07 LAB — POC OCCULT BLOOD, ED: FECAL OCCULT BLD: POSITIVE — AB

## 2016-05-07 MED ORDER — IOPAMIDOL (ISOVUE-300) INJECTION 61%
100.0000 mL | Freq: Once | INTRAVENOUS | Status: AC | PRN
  Administered 2016-05-07: 100 mL via INTRAVENOUS

## 2016-05-07 MED ORDER — IOPAMIDOL (ISOVUE-300) INJECTION 61%
INTRAVENOUS | Status: AC
  Filled 2016-05-07: qty 30

## 2016-05-07 MED ORDER — IOPAMIDOL (ISOVUE-300) INJECTION 61%
30.0000 mL | Freq: Once | INTRAVENOUS | Status: AC | PRN
  Administered 2016-05-07: 30 mL via ORAL

## 2016-05-07 MED ORDER — SODIUM CHLORIDE 0.9 % IV BOLUS (SEPSIS)
1000.0000 mL | Freq: Once | INTRAVENOUS | Status: AC
Start: 2016-05-07 — End: 2016-05-07
  Administered 2016-05-07: 1000 mL via INTRAVENOUS

## 2016-05-07 MED ORDER — ONDANSETRON HCL 4 MG/2ML IJ SOLN
4.0000 mg | Freq: Once | INTRAMUSCULAR | Status: AC
  Administered 2016-05-07: 4 mg via INTRAVENOUS
  Filled 2016-05-07: qty 2

## 2016-05-07 MED ORDER — ONDANSETRON 8 MG PO TBDP
8.0000 mg | ORAL_TABLET | Freq: Once | ORAL | Status: AC
Start: 2016-05-07 — End: 2016-05-07
  Administered 2016-05-07: 8 mg via ORAL
  Filled 2016-05-07: qty 1

## 2016-05-07 MED ORDER — CIPROFLOXACIN HCL 500 MG PO TABS: 500.0000 mg | ORAL_TABLET | Freq: Two times a day (BID) | ORAL | 0 refills | Status: DC

## 2016-05-07 MED ORDER — HYDROCODONE-ACETAMINOPHEN 5-325 MG PO TABS: 1.0000 | ORAL_TABLET | Freq: Four times a day (QID) | ORAL | 0 refills | Status: DC | PRN

## 2016-05-07 MED ORDER — FENTANYL CITRATE (PF) 100 MCG/2ML IJ SOLN
100.0000 ug | Freq: Once | INTRAMUSCULAR | Status: AC
  Administered 2016-05-07: 100 ug via INTRAVENOUS
  Filled 2016-05-07: qty 2

## 2016-05-07 MED ORDER — FENTANYL CITRATE (PF) 100 MCG/2ML IJ SOLN
50.0000 ug | Freq: Once | INTRAMUSCULAR | Status: AC
  Administered 2016-05-07: 50 ug via INTRAVENOUS
  Filled 2016-05-07: qty 2

## 2016-05-07 MED ORDER — METRONIDAZOLE 500 MG PO TABS: 500.0000 mg | ORAL_TABLET | Freq: Two times a day (BID) | ORAL | 0 refills | Status: DC

## 2016-05-07 MED ORDER — AMPICILLIN-SULBACTAM SODIUM 3 (2-1) G IV SOLR: 3.0000 g | Freq: Once | INTRAVENOUS | Status: DC

## 2016-05-07 MED ORDER — SODIUM CHLORIDE 0.9 % IV SOLN
3.0000 g | Freq: Once | INTRAVENOUS | Status: AC
  Administered 2016-05-07: 3 g via INTRAVENOUS
  Filled 2016-05-07: qty 3

## 2016-05-07 MED ORDER — PANTOPRAZOLE SODIUM 40 MG PO TBEC
40.0000 mg | DELAYED_RELEASE_TABLET | Freq: Once | ORAL | Status: AC
  Administered 2016-05-07: 40 mg via ORAL
  Filled 2016-05-07: qty 1

## 2016-05-07 NOTE — ED Triage Notes (Signed)
Pt states he was seen here Sunday for fever and abdominal pain and that he "got a little better" but now he is back to "feeling the same way".

## 2016-05-07 NOTE — ED Provider Notes (Signed)
[facility] Provider Note   CSN: [id number] Arrival date & time: 05/07/16  0101     History   Chief Complaint Chief Complaint  Patient presents with  . Abdominal Pain    HPI Edward Mcgee is a 60 y.o. male.  The history is provided by the patient and the spouse.  Abdominal Pain   This is a new problem. The current episode started more than 2 days ago. The problem occurs daily. The problem has been gradually worsening. The pain is associated with eating. The pain is located in the generalized abdominal region. The pain is moderate. Associated symptoms include constipation. Pertinent negatives include fever. The symptoms are aggravated by eating. Nothing relieves the symptoms.  Patient reports for past week he has had abdominal pain Seen in the ED recently, diagnosed with pneumonia and completed therapy and also had some improvement in abd pain However it started to return, worsened with eating No fever/vomiting No active CP reported He reports he has been having small bowel bowel movements and did note small amount of blood in stool earlier in the night.   He has never had GI bleed before  Past Medical History:  Diagnosis Date  . DM (diabetes mellitus), type 2, uncontrolled (HCC) 02/10/2013  . GERD (gastroesophageal reflux disease)     Patient Active Problem List   Diagnosis Date Noted  . CAP (community acquired pneumonia) 04/30/2016  . Testicular pain 12/05/2015  . Muscle cramps 05/27/2015  . Right knee pain 05/27/2015  . AV block, 1st degree 02/10/2013  . DM (diabetes mellitus), type 2, uncontrolled (HCC) 02/10/2013  . Chest pain 02/09/2013  . LEG PAIN, RIGHT 04/18/2007  . HYPERLIPIDEMIA 02/25/2007  . DIABETES MELLITUS, BORDERLINE 07/09/2006  . BACK PAIN, LUMBAR, CHRONIC 05/31/2006    Past Surgical History:  Procedure Laterality Date  . NO PAST SURGERIES         Home Medications    Prior to Admission medications   Medication Sig Start Date End Date  Taking? Authorizing Provider  acetaminophen (TYLENOL) 325 MG tablet Take 650 mg by mouth every 6 (six) hours as needed.   Yes Historical Provider, MD  azithromycin (ZITHROMAX) 250 MG tablet Take 1 tablet (250 mg total) by mouth daily. Start the evening of 11/20.  Take 250 mg every day x 4 days 04/30/16  Yes Kristen N Ward, DO  fish oil-omega-3 fatty acids 1000 MG capsule Take 1 g by mouth daily.   Yes Historical Provider, MD  Glucosamine HCl (GLUCOSAMINE PO) Take 1 tablet by mouth daily.   Yes Historical Provider, MD  ibuprofen ([facility] IBUPROFEN) 200 MG tablet Take 2-3 tablets (400-600 mg total) by mouth every 6 (six) hours as needed for pain (every 6-8 hrs as needed for chest pain). 02/10/13  Yes Calvert Cantor, MD  pantoprazole (PROTONIX) 40 MG tablet Take 1 tablet (40 mg total) by mouth daily. 04/30/16  Yes Kristen N Ward, DO  ranitidine (ZANTAC) 150 MG tablet Take 150 mg by mouth 2 (two) times daily.   Yes Historical Provider, MD  ondansetron (ZOFRAN ODT) 4 MG disintegrating tablet Take 1 tablet (4 mg total) by mouth every 8 (eight) hours as needed for nausea or vomiting. 04/30/16   Layla Maw Ward, DO    Family History Family History  Problem Relation Age of Onset  . Breast cancer Sister   . Breast cancer Sister     Social History Social History  Substance Use Topics  . Smoking status: Never Smoker  . Smokeless  tobacco: Never Used  . Alcohol use 3.6 oz/week    6 Cans of beer per week     Allergies   Patient has no known allergies.   Review of Systems Review of Systems  Constitutional: Negative for fever.  Cardiovascular: Negative for chest pain.  Gastrointestinal: Positive for abdominal pain, blood in stool and constipation.  All other systems reviewed and are negative.    Physical Exam Updated Vital Signs BP 164/85 (BP Location: Right Arm)   Pulse 87   Temp 98.1 F (36.7 C) (Oral)   Resp 18   SpO2 95%   Physical Exam  CONSTITUTIONAL: Well developed/well  nourished HEAD: Normocephalic/atraumatic EYES: EOMI/PERRL ENMT: Mucous membranes moist NECK: supple no meningeal signs SPINE/BACK:entire spine nontender CV: S1/S2 noted, no murmurs/rubs/gallops noted LUNGS: Lungs are clear to auscultation bilaterally, no apparent distress ABDOMEN: soft, moderate LLQ tenderness, no rebound or guarding, bowel sounds noted throughout abdomen GU:no cva tenderness, no hernia noted Rectal - prostate enlarged, no masses noted, blood tinged stool noted, no melena noted, nurse chaperone present and wife present at patient request NEURO: Pt is awake/alert/appropriate, moves all extremitiesx4.  No facial droop.   EXTREMITIES: pulses normal/equal, full ROM SKIN: warm, color normal PSYCH: no abnormalities of mood noted, alert and oriented to situation  ED Treatments / Results  Labs (all labs ordered are listed, but only abnormal results are displayed) Labs Reviewed  COMPREHENSIVE METABOLIC PANEL - Abnormal; Notable for the following:       Result Value   Sodium 133 (*)    Chloride 97 (*)    Glucose, Bld 306 (*)    ALT 79 (*)    Alkaline Phosphatase 191 (*)    All other components within normal limits  CBC WITH DIFFERENTIAL/PLATELET - Abnormal; Notable for the following:    WBC 14.0 (*)    Platelets 505 (*)    Neutro Abs 11.6 (*)    All other components within normal limits  URINALYSIS, ROUTINE W REFLEX MICROSCOPIC (NOT AT [facility]) - Abnormal; Notable for the following:    Glucose, UA 500 (*)    All other components within normal limits  POC OCCULT BLOOD, ED - Abnormal; Notable for the following:    Fecal Occult Bld POSITIVE (*)    All other components within normal limits  LIPASE, BLOOD  I-STAT CG4 LACTIC ACID, ED    EKG  EKG Interpretation  Date/Time:  Monday May 07 2016 02:17:27 EST Ventricular Rate:  67 PR Interval:    QRS Duration: 88 QT Interval:  406 QTC Calculation: 429 R Axis:   51 Text Interpretation:  Sinus rhythm Borderline  prolonged PR interval Abnormal R-wave progression, early transition Interpretation limited secondary to artifact No significant change since last tracing Confirmed by Bebe ShaggyWICKLINE  MD, Oshae Simmering ([id number]) on 05/07/2016 2:26:29 AM       Radiology Ct Abdomen Pelvis W Contrast  Result Date: 05/07/2016 CLINICAL DATA:  Left lower quadrant pain and fever. EXAM: CT ABDOMEN AND PELVIS WITH CONTRAST TECHNIQUE: Multidetector CT imaging of the abdomen and pelvis was performed using the standard protocol following bolus administration of intravenous contrast. CONTRAST:  100mL ISOVUE-300 IOPAMIDOL (ISOVUE-300) INJECTION 61%, 30mL ISOVUE-300 IOPAMIDOL (ISOVUE-300) INJECTION 61% COMPARISON:  04/29/2016 FINDINGS: Lower chest: Focal consolidation in the right lung base is mildly improved. Small esophageal hiatal hernia. Hepatobiliary: Mild fatty infiltration in the liver. No focal liver abnormality is seen. No gallstones, gallbladder wall thickening, or biliary dilatation. Pancreas: Unremarkable. No pancreatic ductal dilatation or surrounding inflammatory changes. Spleen:  Normal in size without focal abnormality. Adrenals/Urinary Tract: Adrenal glands are unremarkable. Kidneys are normal, without renal calculi, focal lesion, or hydronephrosis. Bladder is unremarkable. Stomach/Bowel: Stomach and small bowel appear normal. Interval development of edematous wall thickening involving the sigmoid and descending colon suggesting regional colitis. Mild stranding around the descending colon. This could represent infectious or inflammatory etiologies. No findings of diverticulitis. Appendix is normal. Vascular/Lymphatic: No significant vascular findings are present. No enlarged abdominal or pelvic lymph nodes. Reproductive: Prostate gland is enlarged, measuring 4.9 cm diameter. Other: No abdominal wall hernia or abnormality. No abdominopelvic ascites. Musculoskeletal: No acute or significant osseous findings. IMPRESSION: Improvement of right  basilar consolidation since previous study. Interval development of edematous wall thickening of the descending and sigmoid colon suggesting colitis, either infectious or inflammatory. Prostate gland is enlarged. Mild fatty infiltration of the liver. Electronically Signed   By: Burman NievesWilliam  Stevens M.D.   On: 05/07/2016 05:56    Procedures Procedures (including critical care time)  Medications Ordered in ED Medications  fentaNYL (SUBLIMAZE) injection 100 mcg (not administered)  ondansetron (ZOFRAN) injection 4 mg (not administered)  ondansetron (ZOFRAN-ODT) disintegrating tablet 8 mg (8 mg Oral Given 05/07/16 0209)  pantoprazole (PROTONIX) EC tablet 40 mg (40 mg Oral Given 05/07/16 0209)     Initial Impression / Assessment and Plan / ED Course  I have reviewed the triage vital signs and the nursing notes.  Pertinent labs results that were available during my care of the patient were reviewed by me and considered in my medical decision making (see chart for details).  Clinical Course     3:46 AM Pt here for recurrent abd pain On his ER evaluation on 11/19, he had epigastric tenderness On my exam, he has focal LLQ tenderness that he reports is worse with eating He did have blood tinged stool, but I don't feel this is an acute GI bleed CT results did not indicate diverticulosis  However due to age, persistent LLQ pain and also elevated white blood cell count, I will repeat CT imaging as he is very uncomfortable appearing and I am concerned there is a new abdominal emergency  7:11 AM Pt improved Colitis noted on CT imaging which would explain pain His abd is nontender on repeat exam He is appropriate for d/c home Will need home oral antibiotics and close PCP followup Pt agreeable with plan He had another BM in the ED that was nonbloody, suspect this was a transient issue  Final Clinical Impressions(s) / ED Diagnoses   Final diagnoses:  Colitis    New Prescriptions New  Prescriptions   CIPROFLOXACIN (CIPRO) 500 MG TABLET    Take 1 tablet (500 mg total) by mouth 2 (two) times daily. One po bid x 7 days   HYDROCODONE-ACETAMINOPHEN (NORCO/VICODIN) 5-325 MG TABLET    Take 1 tablet by mouth every 6 (six) hours as needed.   METRONIDAZOLE (FLAGYL) 500 MG TABLET    Take 1 tablet (500 mg total) by mouth 2 (two) times daily. One po bid x 7 days     Zadie Rhineonald Mechele Kittleson, MD 05/07/16 [phone]

## 2016-05-10 ENCOUNTER — Ambulatory Visit (INDEPENDENT_AMBULATORY_CARE_PROVIDER_SITE_OTHER): Payer: Commercial Managed Care - HMO | Admitting: Family

## 2016-05-10 ENCOUNTER — Encounter: Payer: Self-pay | Admitting: Family

## 2016-05-10 VITALS — BP 134/88 | HR 87 | Temp 98.1°F | Resp 16 | Ht 64.0 in | Wt 157.0 lb

## 2016-05-10 DIAGNOSIS — K529 Noninfective gastroenteritis and colitis, unspecified: Secondary | ICD-10-CM | POA: Diagnosis not present

## 2016-05-10 DIAGNOSIS — Z23 Encounter for immunization: Secondary | ICD-10-CM

## 2016-05-10 MED ORDER — HYDROCODONE-ACETAMINOPHEN 5-325 MG PO TABS: 0.5000 | ORAL_TABLET | Freq: Four times a day (QID) | ORAL | 0 refills | Status: DC | PRN

## 2016-05-10 MED ORDER — DICYCLOMINE HCL 20 MG PO TABS: 20.0000 mg | ORAL_TABLET | Freq: Three times a day (TID) | ORAL | 0 refills | Status: DC | PRN

## 2016-05-10 NOTE — Progress Notes (Signed)
Subjective:    Patient ID: Edward Mcgee, male    DOB: 1955-07-29, 60 y.o.   MRN: [id number]  Chief Complaint  Patient presents with  . Hospitalization Follow-up    having lower abdominal pain, not able to eat bc of discomfort, having issues with bowel movements    HPI:  Edward Mcgee is a 60 y.o. male who  has a past medical history of DM (diabetes mellitus), type 2, uncontrolled (HCC) (02/10/2013) and GERD (gastroesophageal reflux disease). and presents today for a follow up office visit. He is accompanied by a Engineer, structuralpanish translator.   Previously diagnosed in the ED with colitis and started on metronidazole and cirprofloxacin. Reports taking the medicaitons as prescribed and denies adverse side effects. He continues to have multiple bowel movements per day with a freqeuncy of every 10-30 minutes. Continues to experience pain located in his lower abdomen described as cramping. Has been trying to eat soft foods as he can tolerate. No nausea, vomiting or constipation. Reports no further episodes of bright red blood per rectum.    No Known Allergies    Outpatient Medications Prior to Visit  Medication Sig Dispense Refill  . ciprofloxacin (CIPRO) 500 MG tablet Take 1 tablet (500 mg total) by mouth 2 (two) times daily. One po bid x 7 days 14 tablet 0  . pantoprazole (PROTONIX) 40 MG tablet Take 1 tablet (40 mg total) by mouth daily. 30 tablet 0  . HYDROcodone-acetaminophen (NORCO/VICODIN) 5-325 MG tablet Take 1 tablet by mouth every 6 (six) hours as needed. 5 tablet 0  . acetaminophen (TYLENOL) 325 MG tablet Take 650 mg by mouth every 6 (six) hours as needed.    Marland Kitchen. azithromycin (ZITHROMAX) 250 MG tablet Take 1 tablet (250 mg total) by mouth daily. Start the evening of 11/20.  Take 250 mg every day x 4 days 4 tablet 0  . fish oil-omega-3 fatty acids 1000 MG capsule Take 1 g by mouth daily.    . Glucosamine HCl (GLUCOSAMINE PO) Take 1 tablet by mouth daily.    Marland Kitchen. ibuprofen ([facility] IBUPROFEN) 200 MG  tablet Take 2-3 tablets (400-600 mg total) by mouth every 6 (six) hours as needed for pain (every 6-8 hrs as needed for chest pain). 30 tablet 0  . metroNIDAZOLE (FLAGYL) 500 MG tablet Take 1 tablet (500 mg total) by mouth 2 (two) times daily. One po bid x 7 days 14 tablet 0  . ondansetron (ZOFRAN ODT) 4 MG disintegrating tablet Take 1 tablet (4 mg total) by mouth every 8 (eight) hours as needed for nausea or vomiting. 20 tablet 0  . ranitidine (ZANTAC) 150 MG tablet Take 150 mg by mouth 2 (two) times daily.     No facility-administered medications prior to visit.       Past Surgical History:  Procedure Laterality Date  . NO PAST SURGERIES        Past Medical History:  Diagnosis Date  . DM (diabetes mellitus), type 2, uncontrolled (HCC) 02/10/2013  . GERD (gastroesophageal reflux disease)       Review of Systems  Constitutional: Negative for chills and fever.  Respiratory: Negative for chest tightness and shortness of breath.   Cardiovascular: Negative for chest pain, palpitations and leg swelling.  Gastrointestinal: Positive for abdominal pain, diarrhea and nausea. Negative for blood in stool, constipation and vomiting.      Objective:    BP 134/88 (BP Location: Left Arm, Patient Position: Sitting, Cuff Size: Normal)   Pulse 87   Temp  98.1 F (36.7 C) (Oral)   Resp 16   Ht 5\' 4"  (1.626 m)   Wt 157 lb (71.2 kg)   SpO2 97%   BMI 26.95 kg/m  Nursing note and vital signs reviewed.  Physical Exam  Constitutional: He is oriented to person, place, and time. He appears well-developed and well-nourished. No distress.  Cardiovascular: Normal rate, regular rhythm, normal heart sounds and intact distal pulses.   Pulmonary/Chest: Effort normal and breath sounds normal.  Abdominal: Soft. Normal appearance and bowel sounds are normal. He exhibits no mass. There is no hepatosplenomegaly. There is tenderness in the suprapubic area and left lower quadrant. There is no rigidity, no  rebound, no guarding, no tenderness at McBurney's point and negative Murphy's sign.  Neurological: He is alert and oriented to person, place, and time.  Skin: Skin is warm and dry.  Psychiatric: He has a normal mood and affect. His behavior is normal. Judgment and thought content normal.       Assessment & Plan:   Problem List Items Addressed This Visit      Digestive   Colitis - Primary    New problem. CT scan with colitis of inflammatory or infectious origin and currently treated with ciprofloxacin and metronidazole with minimal improvements since onset. Continue current dosage of ciprofloxacin and metronidazole. Start dicyclomine as needed for spasm. Refill short course of hydrocodone-acetaminophen as needed for pain. Refer to GI for colitis and also due for colonoscopy.      Relevant Medications   dicyclomine (BENTYL) 20 MG tablet   HYDROcodone-acetaminophen (NORCO/VICODIN) 5-325 MG tablet   Other Relevant Orders   Ambulatory referral to Gastroenterology    Other Visit Diagnoses    Encounter for immunization       Relevant Medications   dicyclomine (BENTYL) 20 MG tablet   HYDROcodone-acetaminophen (NORCO/VICODIN) 5-325 MG tablet   Other Relevant Orders   Flu Vaccine QUAD 36+ mos IM (Completed)   Ambulatory referral to Gastroenterology       I have discontinued Edward Mcgee's fish oil-omega-3 fatty acids, Glucosamine HCl (GLUCOSAMINE PO), ibuprofen, acetaminophen, ranitidine, azithromycin, ondansetron, and metroNIDAZOLE. I have also changed his HYDROcodone-acetaminophen. Additionally, I am having him start on dicyclomine. Lastly, I am having him maintain his pantoprazole and ciprofloxacin.   Meds ordered this encounter  Medications  . dicyclomine (BENTYL) 20 MG tablet    Sig: Take 1 tablet (20 mg total) by mouth 3 (three) times daily as needed for spasms.    Dispense:  30 tablet    Refill:  0    Order Specific Question:   Supervising Provider    Answer:   Hillard DankerRAWFORD,  ELIZABETH A [[id number]]  . HYDROcodone-acetaminophen (NORCO/VICODIN) 5-325 MG tablet    Sig: Take 0.5-1 tablets by mouth every 6 (six) hours as needed.    Dispense:  10 tablet    Refill:  0    Order Specific Question:   Supervising Provider    Answer:   Hillard DankerRAWFORD, ELIZABETH A [[id number]]     Follow-up: Return if symptoms worsen or fail to improve.  Jeanine Luzalone, Stormee Duda, FNP

## 2016-05-10 NOTE — Assessment & Plan Note (Signed)
New problem. CT scan with colitis of inflammatory or infectious origin and currently treated with ciprofloxacin and metronidazole with minimal improvements since onset. Continue current dosage of ciprofloxacin and metronidazole. Start dicyclomine as needed for spasm. Refill short course of hydrocodone-acetaminophen as needed for pain. Refer to GI for colitis and also due for colonoscopy.

## 2016-05-10 NOTE — Patient Instructions (Signed)
Thank you for choosing [facility].  SUMMARY AND INSTRUCTIONS:  Medication:  Please start the dicyclomine for your stomach cramping.   Continue with the hydrocodone-acetaminophen as needed for pain.  Continue to take your antibiotics as prescribed.   Your prescription(s) have been submitted to your pharmacy or been printed and provided for you. Please take as directed and contact our office if you believe you are having problem(s) with the medication(s) or have any questions.  Follow up:  If your symptoms worsen or fail to improve, please contact our office for further instruction, or in case of emergency go directly to the emergency room at the closest medical facility.    Colitis Introduction Colitis is inflammation of the colon. Colitis may last a short time (acute) or it may last a long time (chronic). What are the causes? This condition may be caused by:  Viruses.  Bacteria.  Reactions to medicine.  Certain autoimmune diseases, such as Crohn disease or ulcerative colitis. What are the signs or symptoms? Symptoms of this condition include:  Diarrhea.  Passing bloody or tarry stool.  Pain.  Fever.  Vomiting.  Tiredness (fatigue).  Weight loss.  Bloating.  Sudden increase in abdominal pain.  Having fewer bowel movements than usual. How is this diagnosed? This condition is diagnosed with a stool test or a blood test. You may also have other tests, including X-rays, a CT scan, or a colonoscopy. How is this treated? Treatment may include:  Resting the bowel. This involves not eating or drinking for a period of time.  Fluids that are given through an IV tube.  Medicine for pain and diarrhea.  Antibiotic medicines.  Cortisone medicines.  Surgery. Follow these instructions at home: Eating and drinking  Follow instructions from your health care provider about eating or drinking restrictions.  Drink enough fluid to keep your urine clear or  pale yellow.  Work with a dietitian to determine which foods cause your condition to flare up.  Avoid foods that cause flare-ups.  Eat a well-balanced diet. Medicines  Take over-the-counter and prescription medicines only as told by your health care provider.  If you were prescribed an antibiotic medicine, take it as told by your health care provider. Do not stop taking the antibiotic even if you start to feel better. General instructions  Keep all follow-up visits as told by your health care provider. This is important. Contact a health care provider if:  Your symptoms do not go away.  You develop new symptoms. Get help right away if:  You have a fever that does not go away with treatment.  You develop chills.  You have extreme weakness, fainting, or dehydration.  You have repeated vomiting.  You develop severe pain in your abdomen.  You pass bloody or tarry stool. This information is not intended to replace advice given to you by your health care provider. Make sure you discuss any questions you have with your health care provider. Document Released: 07/05/2004 Document Revised: 11/03/2015 Document Reviewed: 09/20/2014  2017 Elsevier

## 2016-05-17 ENCOUNTER — Encounter: Payer: Self-pay | Admitting: Gastroenterology

## 2016-06-18 ENCOUNTER — Encounter: Payer: Self-pay | Admitting: Gastroenterology

## 2016-06-18 ENCOUNTER — Ambulatory Visit (INDEPENDENT_AMBULATORY_CARE_PROVIDER_SITE_OTHER): Payer: Commercial Managed Care - HMO | Admitting: Gastroenterology

## 2016-06-18 ENCOUNTER — Other Ambulatory Visit (INDEPENDENT_AMBULATORY_CARE_PROVIDER_SITE_OTHER): Payer: Commercial Managed Care - HMO

## 2016-06-18 VITALS — BP 120/78 | HR 92 | Ht 64.0 in | Wt 163.2 lb

## 2016-06-18 DIAGNOSIS — R933 Abnormal findings on diagnostic imaging of other parts of digestive tract: Secondary | ICD-10-CM

## 2016-06-18 LAB — CBC WITH DIFFERENTIAL/PLATELET
BASOS ABS: 0 10*3/uL (ref 0.0–0.1)
BASOS PCT: 0.3 % (ref 0.0–3.0)
EOS ABS: 0.1 10*3/uL (ref 0.0–0.7)
Eosinophils Relative: 1.9 % (ref 0.0–5.0)
HEMATOCRIT: 42.7 % (ref 39.0–52.0)
Hemoglobin: 14.6 g/dL (ref 13.0–17.0)
LYMPHS ABS: 2.5 10*3/uL (ref 0.7–4.0)
LYMPHS PCT: 36.7 % (ref 12.0–46.0)
MCHC: 34.1 g/dL (ref 30.0–36.0)
MCV: 81.1 fl (ref 78.0–100.0)
Monocytes Absolute: 0.5 10*3/uL (ref 0.1–1.0)
Monocytes Relative: 7.3 % (ref 3.0–12.0)
NEUTROS ABS: 3.6 10*3/uL (ref 1.4–7.7)
NEUTROS PCT: 53.8 % (ref 43.0–77.0)
PLATELETS: 267 10*3/uL (ref 150.0–400.0)
RBC: 5.26 Mil/uL (ref 4.22–5.81)
RDW: 13.1 % (ref 11.5–15.5)
WBC: 6.7 10*3/uL (ref 4.0–10.5)

## 2016-06-18 LAB — COMPREHENSIVE METABOLIC PANEL
ALT: 30 U/L (ref 0–53)
AST: 24 U/L (ref 0–37)
Albumin: 4.6 g/dL (ref 3.5–5.2)
Alkaline Phosphatase: 86 U/L (ref 39–117)
BUN: 17 mg/dL (ref 6–23)
CALCIUM: 10.3 mg/dL (ref 8.4–10.5)
CHLORIDE: 101 meq/L (ref 96–112)
CO2: 25 meq/L (ref 19–32)
Creatinine, Ser: 0.88 mg/dL (ref 0.40–1.50)
GFR: 93.32 mL/min (ref >60.00)
GLUCOSE: 184 mg/dL — AB (ref 70–99)
Potassium: 3.9 mEq/L (ref 3.5–5.1)
Sodium: 136 mEq/L (ref 135–145)
Total Bilirubin: 0.6 mg/dL (ref 0.2–1.2)
Total Protein: 7.7 g/dL (ref 6.0–8.3)

## 2016-06-18 MED ORDER — NA SULFATE-K SULFATE-MG SULF 17.5-3.13-1.6 GM/177ML PO SOLN: ORAL | 0 refills | Status: DC

## 2016-06-18 NOTE — Patient Instructions (Addendum)
Your physician has requested that you go to the basement for the following lab work before leaving today: CBC, CMET  You have been scheduled for a colonoscopy. Please follow written instructions given to you at your visit today.  Please pick up your prep supplies at the pharmacy within the next 1-3 days. If you use inhalers (even only as needed), please bring them with you on the day of your procedure. Your physician has requested that you go to www.startemmi.com and enter the access code given to you at your visit today. This web site gives a general overview about your procedure. However, you should still follow specific instructions given to you by our office regarding your preparation for the procedure.  Please start taking citrucel (orange flavored) powder fiber supplement.  This may cause some bloating at first but that usually goes away. Begin with a small spoonful and work your way up to a large, heaping spoonful daily over a week. Staying hydrated will help your constipation.  Your body mass index should be between 19-25. Your Body mass index is 28.02 kg/m. If this is out of the aformentioned range listed, please consider follow up with your Primary Care Provider.

## 2016-06-18 NOTE — Progress Notes (Signed)
HPI: This is a   very pleasant 61 year old man  who was referred to me by Edward Mcgee, Edward D, FNP  to evaluate  chronic constipation, abnormal colon on recent CT scan .    Chief complaint is chronic constipation, abnormal colon on recent CT scan  He is spanish speaker, using a Oncologistprof translator.  Still the usual language barrier exists.  He had pneumonia in November.  He was in the hospital at least in the emergency room 2-3 times that month. CT scan in early November showed normal colon.  CT scan 05/07/2016 abd pelv with iv and po contrast; done for LLQ pain, fever. Improvement of right basilar consolidation since previous study. Interval development of edematous wall thickening of the descending and sigmoid colon suggesting colitis, either infectious or inflammatory. Prostate gland is enlarged. Mild fatty infiltration of the liver.  He tends to be constipated will have to push and strain to move his bowels  04/2016 labs FOBT + stool; WBC 14K  he was put on Cipro and Flagyl for antibiotics  Since leaving the hospital he has felt much better.  The LLQ pain is gone.  He is still having constipation, difficulty with bowels.  Never had colon cancer screening.  No FH of colon cancer.  Review of systems: Pertinent positive and negative review of systems were noted in the above HPI section. Complete review of systems was performed and was otherwise normal.   Past Medical History:  Diagnosis Date  . DM (diabetes mellitus), type 2, uncontrolled (HCC) 02/10/2013  . GERD (gastroesophageal reflux disease)     Past Surgical History:  Procedure Laterality Date  . NO PAST SURGERIES      Current Outpatient Prescriptions  Medication Sig Dispense Refill  . dicyclomine (BENTYL) 20 MG tablet Take 1 tablet (20 mg total) by mouth 3 (three) times daily as needed for spasms. (Patient taking differently: Take 20 mg by mouth 3 (three) times daily as needed for spasms. PT IS OUT OF MEDICATION) 30 tablet 0   . pantoprazole (PROTONIX) 40 MG tablet Take 1 tablet (40 mg total) by mouth daily. (Patient taking differently: Take 40 mg by mouth daily. PT IS OUT OF MEDICATION) 30 tablet 0   No current facility-administered medications for this visit.     Allergies as of 06/18/2016  . (No Known Allergies)    Family History  Problem Relation Age of Onset  . Breast cancer Sister   . Breast cancer Sister   . Uterine cancer Sister   . Colon cancer Neg Hx   . Esophageal cancer Neg Hx   . Rectal cancer Neg Hx   . Stomach cancer Neg Hx   . Liver cancer Neg Hx     Social History   Social History  . Marital status: Married    Spouse name: N/A  . Number of children: 3  . Years of education: 7511   Occupational History  . Maintenance    Social History Main Topics  . Smoking status: Never Smoker  . Smokeless tobacco: Never Used  . Alcohol use Yes     Comment: socially  . Drug use: No  . Sexual activity: Not on file   Other Topics Concern  . Not on file   Social History Narrative  . No narrative on file     Physical Exam: BP 120/78   Pulse 92   Ht 5\' 4"  (1.626 m)   Wt 163 lb 4 oz (74 kg)   BMI 28.02 kg/m  Constitutional: generally well-appearing Psychiatric: alert and oriented x3 Eyes: extraocular movements intact Mouth: oral pharynx moist, no lesions Neck: supple no lymphadenopathy Cardiovascular: heart regular rate and rhythm Lungs: clear to auscultation bilaterally Abdomen: soft, nontender, nondistended, no obvious ascites, no peritoneal signs, normal bowel sounds Extremities: no lower extremity edema bilaterally Skin: no lesions on visible extremities   Assessment and plan: 61 y.o. male with  Chronic mild constipation, abnormal left colon on recent CT scan, FOBT + stool  I suspect he had mild diverticulitis or perhaps infectious colitis area his symptoms have completely improved after oral antibiotics. He has never had colon cancer screening I recommended we proceed  with colonoscopy at his soonest convenience.  I would like to repeat his lab tests including CBC and complete metabolic profile. He did have slightly elevated liver tests while he was acutely ill I will like to make sure that those have resolved. He is going to add fiber supplements to his fruit smoothie see if we can get his bowels moving a little easier as well.   Edward Bunting, MD  Gastroenterology 06/18/2016, 10:38 AM  Cc: Edward Speak, FNP

## 2016-06-19 ENCOUNTER — Encounter: Payer: Self-pay | Admitting: Gastroenterology

## 2016-06-25 ENCOUNTER — Encounter: Payer: Self-pay | Admitting: Gastroenterology

## 2016-06-25 ENCOUNTER — Ambulatory Visit (AMBULATORY_SURGERY_CENTER): Payer: Commercial Managed Care - HMO | Admitting: Gastroenterology

## 2016-06-25 VITALS — BP 110/75 | HR 65 | Temp 98.0°F | Resp 18 | Ht 64.0 in | Wt 163.0 lb

## 2016-06-25 DIAGNOSIS — R9389 Abnormal findings on diagnostic imaging of other specified body structures: Secondary | ICD-10-CM

## 2016-06-25 DIAGNOSIS — R938 Abnormal findings on diagnostic imaging of other specified body structures: Secondary | ICD-10-CM | POA: Diagnosis present

## 2016-06-25 MED ORDER — SODIUM CHLORIDE 0.9 % IV SOLN: 500.0000 mL | INTRAVENOUS | Status: AC

## 2016-06-25 NOTE — Progress Notes (Signed)
Report given to PACU RN, vss 

## 2016-06-25 NOTE — Op Note (Signed)
[facility] Patient Name: Gilford RileRogelio Seabrooks Procedure Date: 06/25/2016 3:12 PM MRN: [id number] Endoscopist: Rachael Feeaniel P Varian Innes , MD Age: 61 Referring MD:  Date of Birth: [birth year] Gender: Male Account #: [id number] Procedure:                Colonoscopy Indications:              Abnormal CT of the GI tract (abnormal left colon on                            recent CT scan) Medicines:                Monitored Anesthesia Care Procedure:                Pre-Anesthesia Assessment:                           - Prior to the procedure, a History and Physical                            was performed, and patient medications and                            allergies were reviewed. The patient's tolerance of                            previous anesthesia was also reviewed. The risks                            and benefits of the procedure and the sedation                            options and risks were discussed with the patient.                            All questions were answered, and informed consent                            was obtained. Prior Anticoagulants: The patient has                            taken no previous anticoagulant or antiplatelet                            agents. ASA Grade Assessment: II - A patient with                            mild systemic disease. After reviewing the risks                            and benefits, the patient was deemed in                            satisfactory condition to undergo the procedure.  After obtaining informed consent, the colonoscope                            was passed under direct vision. Throughout the                            procedure, the patient's blood pressure, pulse, and                            oxygen saturations were monitored continuously. The                            Model CF-HQ190L [id number]) scope was introduced                            through the anus and advanced to the the  cecum,                            identified by appendiceal orifice and ileocecal                            valve. The colonoscopy was performed without                            difficulty. The patient tolerated the procedure                            well. The quality of the bowel preparation was                            good. The ileocecal valve, appendiceal orifice, and                            rectum were photographed. Scope In: 3:15:39 PM Scope Out: 3:23:43 PM Scope Withdrawal Time: 0 hours 6 minutes 18 seconds  Total Procedure Duration: 0 hours 8 minutes 4 seconds  Findings:                 Multiple small and large-mouthed diverticula were                            found in the left colon. There was narrowing of the                            colon in association with the diverticular opening.                            Erythema was seen in association with the                            diverticular opening.                           The exam was otherwise without abnormality on  direct and retroflexion views. Complications:            No immediate complications. Estimated blood loss:                            None. Estimated Blood Loss:     Estimated blood loss: none. Impression:               - Moderate diverticulosis in the left colon. There                            was narrowing of the colon in association with the                            diverticular opening. Erythema was seen in                            association with the diverticular opening.                           - The examination was otherwise normal on direct                            and retroflexion views.                           - No polyps or cancers. Recommendation:           - Patient has a contact number available for                            emergencies. The signs and symptoms of potential                            delayed complications were discussed with  the                            patient. Return to normal activities tomorrow.                            Written discharge instructions were provided to the                            patient.                           - Resume previous diet.                           - Continue present medications.                           - Repeat colonoscopy in 10 years for screening                            purposes. Rachael Fee, MD 06/25/2016 3:27:21 PM This report has been signed electronically.

## 2016-06-25 NOTE — Patient Instructions (Signed)
YOU HAD AN ENDOSCOPIC PROCEDURE TODAY AT THE [facility]:   Refer to the procedure report that was given to you for any specific questions about what was found during the examination.  If the procedure report does not answer your questions, please call your gastroenterologist to clarify.  If you requested that your care partner not be given the details of your procedure findings, then the procedure report has been included in a sealed envelope for you to review at your convenience later.  YOU SHOULD EXPECT: Some feelings of bloating in the abdomen. Passage of more gas than usual.  Walking can help get rid of the air that was put into your GI tract during the procedure and reduce the bloating. If you had a lower endoscopy (such as a colonoscopy or flexible sigmoidoscopy) you may notice spotting of blood in your stool or on the toilet paper. If you underwent a bowel prep for your procedure, you may not have a normal bowel movement for a few days.  Please Note:  You might notice some irritation and congestion in your nose or some drainage.  This is from the oxygen used during your procedure.  There is no need for concern and it should clear up in a day or so.  SYMPTOMS TO REPORT IMMEDIATELY:   Following lower endoscopy (colonoscopy or flexible sigmoidoscopy):  Excessive amounts of blood in the stool  Significant tenderness or worsening of abdominal pains  Swelling of the abdomen that is new, acute  Fever of 100F or higher   For urgent or emergent issues, a gastroenterologist can be reached at any hour by calling [phone].   DIET:  We do recommend a small meal at first, but then you may proceed to your regular diet.  Drink plenty of fluids but you should avoid alcoholic beverages for 24 hours. Try to increase the fiber in your diet, and drink plenty of water.  ACTIVITY:  You should plan to take it easy for the rest of today and you should NOT DRIVE or use heavy machinery until  tomorrow (because of the sedation medicines used during the test).    FOLLOW UP: Our staff will call the number listed on your records the next business day following your procedure to check on you and address any questions or concerns that you may have regarding the information given to you following your procedure. If we do not reach you, we will leave a message.  However, if you are feeling well and you are not experiencing any problems, there is no need to return our call.  We will assume that you have returned to your regular daily activities without incident.   SIGNATURES/CONFIDENTIALITY: You and/or your care partner have signed paperwork which will be entered into your electronic medical record.  These signatures attest to the fact that that the information above on your After Visit Summary has been reviewed and is understood.  Full responsibility of the confidentiality of this discharge information lies with you and/or your care-partner.  Thank-you for choosing us for your healthcare needs today.

## 2016-06-26 ENCOUNTER — Telehealth: Payer: Self-pay | Admitting: *Deleted

## 2016-06-26 NOTE — Telephone Encounter (Signed)
  Follow up Call-  Call back number 06/25/2016  Post procedure Call Back phone  # [phone]  Permission to leave phone message Yes  Some recent data might be hidden     Patient questions:  Do you have a fever, pain , or abdominal swelling? No. Pain Score  0 *  Have you tolerated food without any problems? Yes.    Have you been able to return to your normal activities? Yes.    Do you have any questions about your discharge instructions: Diet   No. Medications  No. Follow up visit  No.  Do you have questions or concerns about your Care? No.  Actions: * If pain score is 4 or above: No action needed, pain <4.

## 2016-08-16 ENCOUNTER — Ambulatory Visit (INDEPENDENT_AMBULATORY_CARE_PROVIDER_SITE_OTHER): Payer: Commercial Managed Care - HMO | Admitting: Family

## 2016-08-16 ENCOUNTER — Encounter: Payer: Self-pay | Admitting: Family

## 2016-08-16 VITALS — BP 140/80 | HR 64 | Temp 98.3°F | Resp 16 | Ht 64.0 in | Wt 169.0 lb

## 2016-08-16 DIAGNOSIS — L219 Seborrheic dermatitis, unspecified: Secondary | ICD-10-CM | POA: Diagnosis not present

## 2016-08-16 MED ORDER — CICLOPIROX 1 % EX SHAM: MEDICATED_SHAMPOO | CUTANEOUS | 0 refills | Status: DC

## 2016-08-16 MED ORDER — FLUCONAZOLE 150 MG PO TABS: 150.0000 mg | ORAL_TABLET | ORAL | 0 refills | Status: DC

## 2016-08-16 NOTE — Assessment & Plan Note (Signed)
Symptoms and exam consistent with seborrheic dermatitis of the scalp. Start ciclopirox shampoo. Recommend Neutrogena/T-Gel shampoo for itching. Written prescription for fluconazole provided if symptoms worsen or do not improve.

## 2016-08-16 NOTE — Progress Notes (Signed)
Subjective:    Patient ID: Edward Mcgee, male    DOB: 05-07-1956, 61 y.o.   MRN: [id number]  Chief Complaint  Patient presents with  . Rash    x2 months, itching, has pimples on head    HPI:  Edward Mcgee is a 61 y.o. male who  has a past medical history of DM (diabetes mellitus), type 2, uncontrolled (HCC) (02/10/2013) and GERD (gastroesophageal reflux disease). and presents today for an acute office visit.  This is a new problem. Associated symptom of a rash located across his scalp that has been going on for about 2 months. Described as itchy. Modifying factors include changing shampoos to a coconut oil based on pharmacy recommendations. No rashes anywhere else.   No Known Allergies    Outpatient Medications Prior to Visit  Medication Sig Dispense Refill  . dicyclomine (BENTYL) 20 MG tablet Take 1 tablet (20 mg total) by mouth 3 (three) times daily as needed for spasms. (Patient taking differently: Take 20 mg by mouth 3 (three) times daily as needed for spasms. PT IS OUT OF MEDICATION) 30 tablet 0  . pantoprazole (PROTONIX) 40 MG tablet Take 1 tablet (40 mg total) by mouth daily. (Patient taking differently: Take 40 mg by mouth daily. PT IS OUT OF MEDICATION) 30 tablet 0   Facility-Administered Medications Prior to Visit  Medication Dose Route Frequency Provider Last Rate Last Dose  . 0.9 %  sodium chloride infusion  500 mL Intravenous Continuous Rachael Feeaniel P Jacobs, MD        Review of Systems  Constitutional: Negative for chills and fever.  Respiratory: Negative for chest tightness and shortness of breath.   Skin: Positive for color change and rash.      Objective:    BP 140/80 (BP Location: Left Arm, Patient Position: Sitting, Cuff Size: Normal)   Pulse 64   Temp 98.3 F (36.8 C) (Oral)   Resp 16   Ht 5\' 4"  (1.626 m)   Wt 169 lb (76.7 kg)   SpO2 97%   BMI 29.01 kg/m  Nursing note and vital signs reviewed.  Physical Exam  Constitutional: He is oriented to person,  place, and time. He appears well-developed and well-nourished. No distress.  Cardiovascular: Normal rate, regular rhythm, normal heart sounds and intact distal pulses.   Pulmonary/Chest: Effort normal and breath sounds normal.  Neurological: He is alert and oriented to person, place, and time.  Skin: Skin is warm and dry.  Scalp with dry hair and mild red papules sporadically throughout his scalp with no significant redness or irritation. No discharge.  Psychiatric: He has a normal mood and affect. His behavior is normal. Judgment and thought content normal.       Assessment & Plan:   Problem List Items Addressed This Visit      Musculoskeletal and Integument   Seborrheic dermatitis of scalp - Primary    Symptoms and exam consistent with seborrheic dermatitis of the scalp. Start ciclopirox shampoo. Recommend Neutrogena/T-Gel shampoo for itching. Written prescription for fluconazole provided if symptoms worsen or do not improve.       Relevant Medications   Ciclopirox 1 % shampoo   fluconazole (DIFLUCAN) 150 MG tablet       I have discontinued [name]'s pantoprazole and dicyclomine. I am also having him start on Ciclopirox and fluconazole. We will continue to administer sodium chloride.   Meds ordered this encounter  Medications  . Ciclopirox 1 % shampoo    Sig: Apply 5-10  ml to wet hair; lather, and leave on hair and scalp for ~3 minutes; rinse. Repeat every 3rd day for up 4 weeks.    Dispense:  120 mL    Refill:  0    Order Specific Question:   Supervising Provider    Answer:   Hillard Danker A [[id number]]  . fluconazole (DIFLUCAN) 150 MG tablet    Sig: Take 1 tablet (150 mg total) by mouth once a week.    Dispense:  2 tablet    Refill:  0    Order Specific Question:   Supervising Provider    Answer:   Hillard Danker A [[id number]]     Follow-up: Return if symptoms worsen or fail to improve.  Jeanine Luz, FNP

## 2016-08-16 NOTE — Patient Instructions (Signed)
Thank you for choosing [facility].  SUMMARY AND INSTRUCTIONS:  Medication:  May also try Neutragena T/Gel therapeutic shampoo.  Please start the shampoo.  If your symptoms do not improve please take the fluconazole  If your symptoms worsen we will send you to dermatology.   Your prescription(s) have been submitted to your pharmacy or been printed and provided for you. Please take as directed and contact our office if you believe you are having problem(s) with the medication(s) or have any questions.  Follow up:  If your symptoms worsen or fail to improve, please contact our office for further instruction, or in case of emergency go directly to the emergency room at the closest medical facility.   Seborrheic Dermatitis, Adult Seborrheic dermatitis is a skin disease that causes red, scaly patches. It usually occurs on the scalp, and it is often called dandruff. The patches may appear on other parts of the body. Skin patches tend to appear where there are many oil glands in the skin. Areas of the body that are commonly affected include:  Scalp.  Skin folds of the body.  Ears.  Eyebrows.  Neck.  Face.  Armpits.  The bearded area of men's faces. The condition may come and go for no known reason, and it is often long-lasting (chronic). What are the causes? The cause of this condition is not known. What increases the risk? This condition is more likely to develop in people who:  Have certain conditions, such as:  HIV (human immunodeficiency virus).  AIDS (acquired immunodeficiency syndrome).  Parkinson disease.  Mood disorders, such as depression.  Are 6740-62 years old. What are the signs or symptoms? Symptoms of this condition include:  Thick scales on the scalp.  Redness on the face or in the armpits.  Skin that is flaky. The flakes may be white or yellow.  Skin that seems oily or dry but is not helped with moisturizers.  Itching or burning in the  affected areas. How is this diagnosed? This condition is diagnosed with a medical history and physical exam. A sample of your skin may be tested (skin biopsy). You may need to see a skin specialist (dermatologist). How is this treated? There is no cure for this condition, but treatment can help to manage the symptoms. You may get treatment to remove scales, lower the risk of skin infection, and reduce swelling or itching. Treatment may include:  Creams that reduce swelling and irritation (steroids).  Creams that reduce skin yeast.  Medicated shampoo, soaps, moisturizing creams, or ointments.  Medicated moisturizing creams or ointments. Follow these instructions at home:  Apply over-the-counter and prescription medicines only as told by your health care provider.  Use any medicated shampoo, soaps, skin creams, or ointments only as told by your health care provider.  Keep all follow-up visits as told by your health care provider. This is important. Contact a health care provider if:  Your symptoms do not improve with treatment.  Your symptoms get worse.  You have new symptoms. This information is not intended to replace advice given to you by your health care provider. Make sure you discuss any questions you have with your health care provider. Document Released: 05/28/2005 Document Revised: 12/16/2015 Document Reviewed: 09/15/2015 Elsevier Interactive Patient Education  2017 [employer]

## 2016-12-28 ENCOUNTER — Ambulatory Visit: Payer: Commercial Managed Care - HMO | Admitting: Family

## 2017-01-18 ENCOUNTER — Ambulatory Visit (INDEPENDENT_AMBULATORY_CARE_PROVIDER_SITE_OTHER): Payer: 59 | Admitting: Urology

## 2017-01-18 DIAGNOSIS — R3912 Poor urinary stream: Secondary | ICD-10-CM | POA: Diagnosis not present

## 2017-01-18 DIAGNOSIS — N4341 Spermatocele of epididymis, single: Secondary | ICD-10-CM | POA: Diagnosis not present

## 2017-01-18 DIAGNOSIS — N401 Enlarged prostate with lower urinary tract symptoms: Secondary | ICD-10-CM

## 2017-02-13 ENCOUNTER — Ambulatory Visit (INDEPENDENT_AMBULATORY_CARE_PROVIDER_SITE_OTHER): Payer: 59 | Admitting: Family

## 2017-02-13 ENCOUNTER — Other Ambulatory Visit (INDEPENDENT_AMBULATORY_CARE_PROVIDER_SITE_OTHER): Payer: 59

## 2017-02-13 ENCOUNTER — Encounter: Payer: Self-pay | Admitting: Family

## 2017-02-13 VITALS — BP 134/82 | HR 65 | Temp 98.4°F | Resp 16 | Ht 64.0 in | Wt 160.0 lb

## 2017-02-13 DIAGNOSIS — R221 Localized swelling, mass and lump, neck: Secondary | ICD-10-CM | POA: Diagnosis not present

## 2017-02-13 LAB — CBC WITH DIFFERENTIAL/PLATELET
BASOS ABS: 0 10*3/uL (ref 0.0–0.1)
Basophils Relative: 0.6 % (ref 0.0–3.0)
Eosinophils Absolute: 0.2 10*3/uL (ref 0.0–0.7)
Eosinophils Relative: 3.8 % (ref 0.0–5.0)
HEMATOCRIT: 44.2 % (ref 39.0–52.0)
Hemoglobin: 14.4 g/dL (ref 13.0–17.0)
LYMPHS ABS: 2.3 10*3/uL (ref 0.7–4.0)
Lymphocytes Relative: 38.2 % (ref 12.0–46.0)
MCHC: 32.7 g/dL (ref 30.0–36.0)
MCV: 84.9 fl (ref 78.0–100.0)
Monocytes Absolute: 0.6 10*3/uL (ref 0.1–1.0)
Monocytes Relative: 9.1 % (ref 3.0–12.0)
NEUTROS ABS: 2.9 10*3/uL (ref 1.4–7.7)
NEUTROS PCT: 48.3 % (ref 43.0–77.0)
PLATELETS: 280 10*3/uL (ref 150.0–400.0)
RBC: 5.2 Mil/uL (ref 4.22–5.81)
RDW: 12.8 % (ref 11.5–15.5)
WBC: 6.1 10*3/uL (ref 4.0–10.5)

## 2017-02-13 NOTE — Patient Instructions (Signed)
Thank you for choosing [facility].  SUMMARY AND INSTRUCTIONS:  We will check your white/red blood cells today.  You will receive a call to schedule the ultrasound.  Treatment will be pending the ultrasound results as needed.   Labs:  Please stop by the lab on the lower level of the building for your blood work. Your results will be released to MyChart (or called to you) after review, usually within 72 hours after test completion. If any changes need to be made, you will be notified at that same time.  1.) The lab is open from 7:30am to 5:30 pm Monday-Friday 2.) No appointment is necessary 3.) Fasting (if needed) is 6-8 hours after food and drink; black coffee and water are okay   Referrals:  Referrals have been made during this visit. You should expect to hear back from our schedulers in about 7-10 days in regards to establishing an appointment with the specialists we discussed.   Follow up:  If your symptoms worsen or fail to improve, please contact our office for further instruction, or in case of emergency go directly to the emergency room at the closest medical facility.

## 2017-02-13 NOTE — Progress Notes (Signed)
Subjective:    Patient ID: Edward Mcgee, male    DOB: 1955-09-16, 61 y.o.   MRN: [id number]  Chief Complaint  Patient presents with  . Sore Throat    sore throat x1 month, no other symptoms     HPI:  Edward Mcgee is a 61 y.o. male who  has a past medical history of DM (diabetes mellitus), type 2, uncontrolled (HCC) (02/10/2013) and GERD (gastroesophageal reflux disease). and presents today for an office visit.  This is a new problem. Associated symptoms of sore throat/neck pain has been going on for about 1 month. No fevers or other cold/cough symptoms. No coughing at night. Described as pain on the left side of his neck. Aggravated when he brushes his teeth. No aggravated by food. No problems swallowing. Describes like he has phelgm on occasion.     No Known Allergies    Outpatient Medications Prior to Visit  Medication Sig Dispense Refill  . Ciclopirox 1 % shampoo Apply 5-10 ml to wet hair; lather, and leave on hair and scalp for ~3 minutes; rinse. Repeat every 3rd day for up 4 weeks. 120 mL 0  . fluconazole (DIFLUCAN) 150 MG tablet Take 1 tablet (150 mg total) by mouth once a week. 2 tablet 0   Facility-Administered Medications Prior to Visit  Medication Dose Route Frequency Provider Last Rate Last Dose  . 0.9 %  sodium chloride infusion  500 mL Intravenous Continuous Rachael FeeJacobs, Daniel P, MD         Past Medical History:  Diagnosis Date  . DM (diabetes mellitus), type 2, uncontrolled (HCC) 02/10/2013  . GERD (gastroesophageal reflux disease)       Review of Systems  Constitutional: Negative for chills and fever.  HENT: Positive for sore throat. Negative for congestion, facial swelling, postnasal drip, rhinorrhea, sinus pain, sinus pressure, sneezing and trouble swallowing.        Positive for a neck mass.       Objective:    BP 134/82 (BP Location: Left Arm, Patient Position: Sitting, Cuff Size: Normal)   Pulse 65   Temp 98.4 F (36.9 C) (Oral)   Resp 16   Ht 5'  4" (1.626 m)   Wt 160 lb (72.6 kg)   SpO2 97%   BMI 27.46 kg/m  Nursing note and vital signs reviewed.  Physical Exam  Constitutional: He is oriented to person, place, and time. He appears well-developed and well-nourished. No distress.  Neck:    Cardiovascular: Normal rate, regular rhythm, normal heart sounds and intact distal pulses.   Pulmonary/Chest: Effort normal and breath sounds normal.  Neurological: He is alert and oriented to person, place, and time.  Skin: Skin is warm and dry.  Psychiatric: He has a normal mood and affect. His behavior is normal. Judgment and thought content normal.       Assessment & Plan:   Problem List Items Addressed This Visit      Other   Mass of neck - Primary    New onset mass of neck with concern for lymphadenopathy although cannot rule out malignancy. No airway obstruction or dysphasia. Obtain ultrasound and CBC. Continue to monitor pending imaging and blood work results.       Relevant Orders   CBC w/Diff (Completed)   US Soft Tissue Head/Neck       I am having Mr. Edward Mcgee maintain his Ciclopirox and fluconazole. We will continue to administer sodium chloride.   Follow-up: Return if symptoms worsen or fail  to improve.  Mauricio Po, FNP

## 2017-02-13 NOTE — Assessment & Plan Note (Signed)
New onset mass of neck with concern for lymphadenopathy although cannot rule out malignancy. No airway obstruction or dysphasia. Obtain ultrasound and CBC. Continue to monitor pending imaging and blood work results.

## 2017-02-16 IMAGING — CT CT ABD-PELV W/ CM
2 of 5 series · 15 of 46 positions shown, 17 images · IV contrast (APPLIED)
Comparison: 04/29/2016

CLINICAL DATA: Left lower quadrant pain and fever.

EXAM:
CT ABDOMEN AND PELVIS WITH CONTRAST
TECHNIQUE: Multidetector CT imaging of the abdomen and pelvis was performed
using the standard protocol following bolus administration of
intravenous contrast.
CONTRAST:  100mL Z16AXI-NOO IOPAMIDOL (Z16AXI-NOO) INJECTION 61%,
30mL Z16AXI-NOO IOPAMIDOL (Z16AXI-NOO) INJECTION 61%

[Series 2: axial st · axial · 0.74mm/px · z∈[+940,+1390]mm · 12 of 103 slices shown, 14 images]
[im 7/103  soft-tissue]
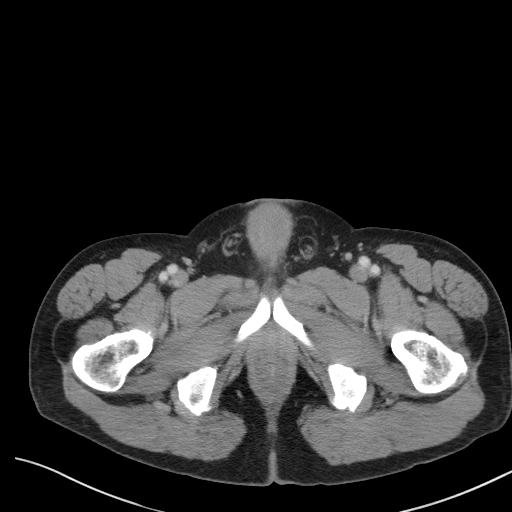
[im 7/103  bone]
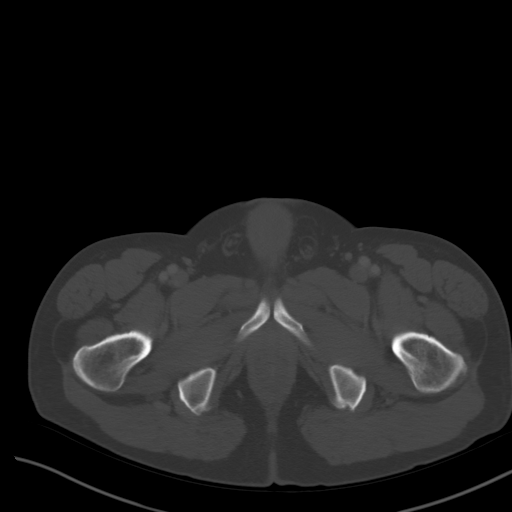
[im 19/103  soft-tissue]
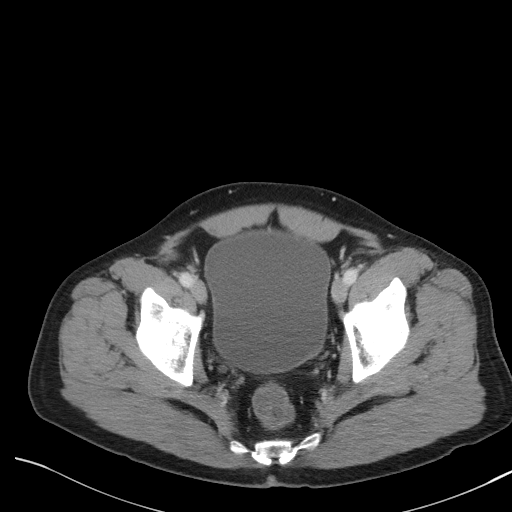
[im 25/103  soft-tissue]
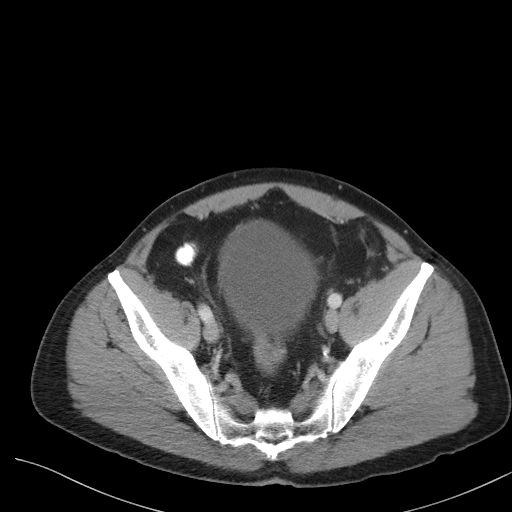
[im 31/103  soft-tissue]
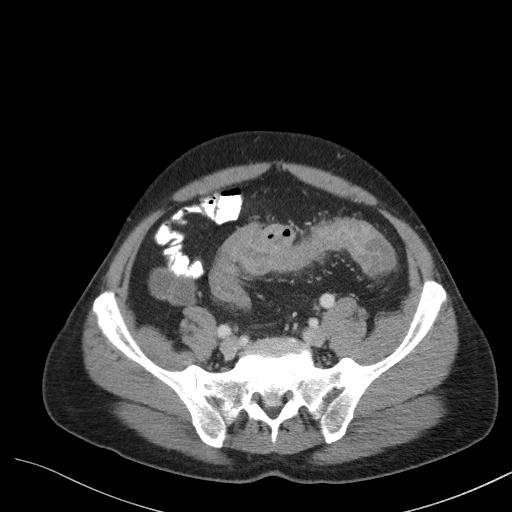
[im 43/103  soft-tissue]
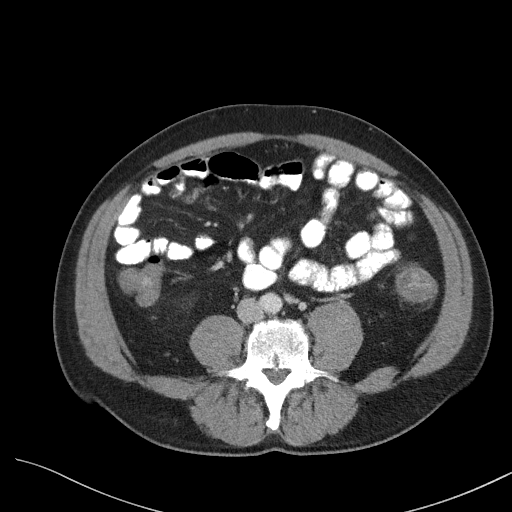
[im 49/103  soft-tissue]
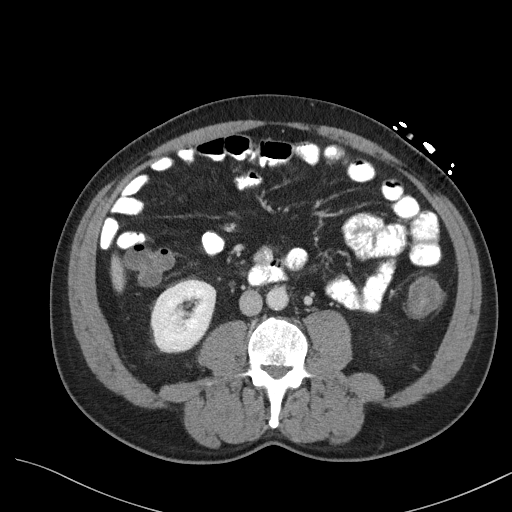
[im 55/103  soft-tissue]
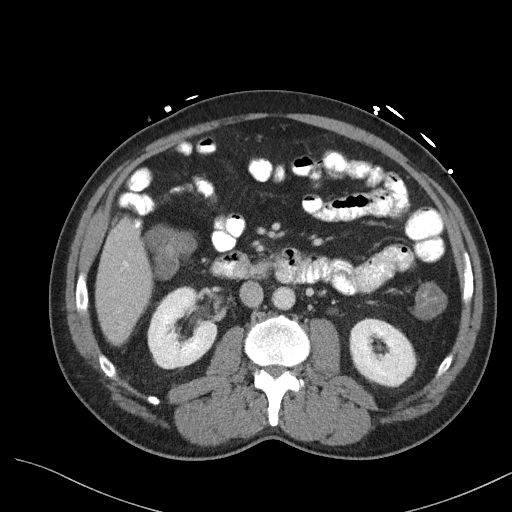
[im 67/103  soft-tissue]
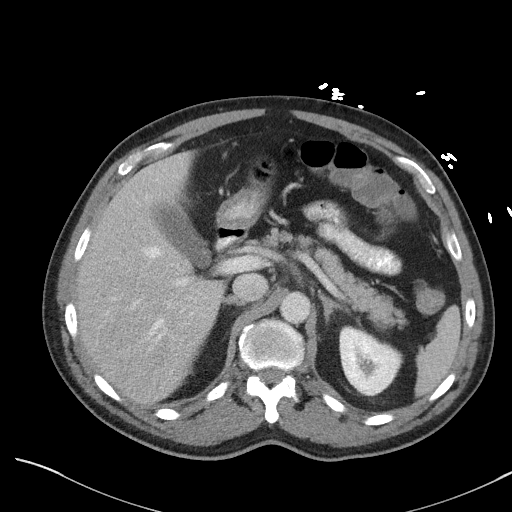
[im 73/103  soft-tissue]
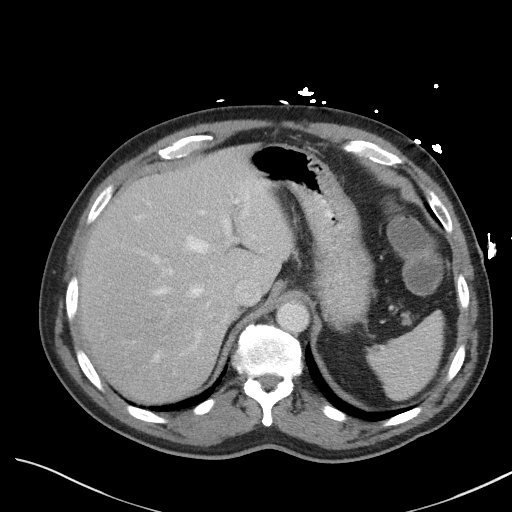
[im 73/103  bone]
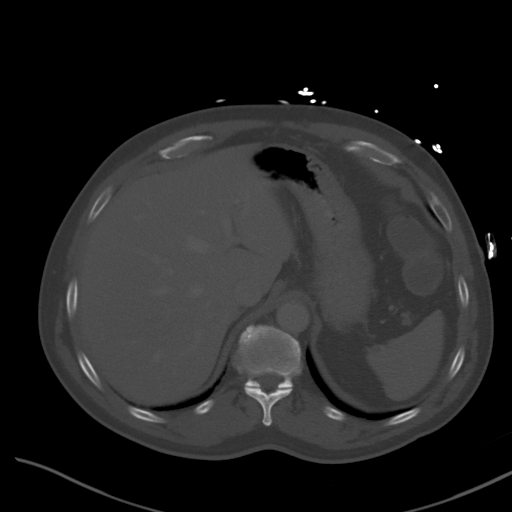
[im 79/103  soft-tissue]
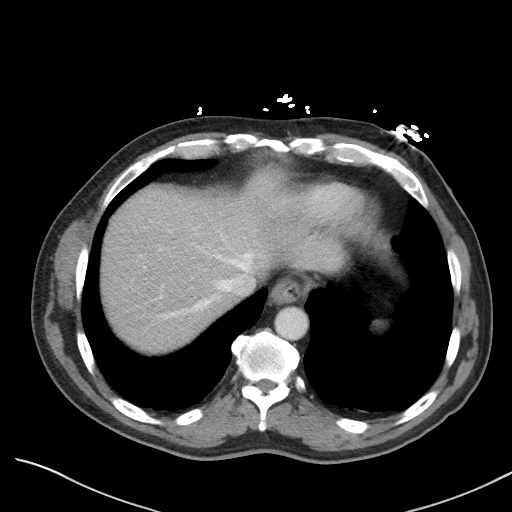
[im 91/103  soft-tissue]
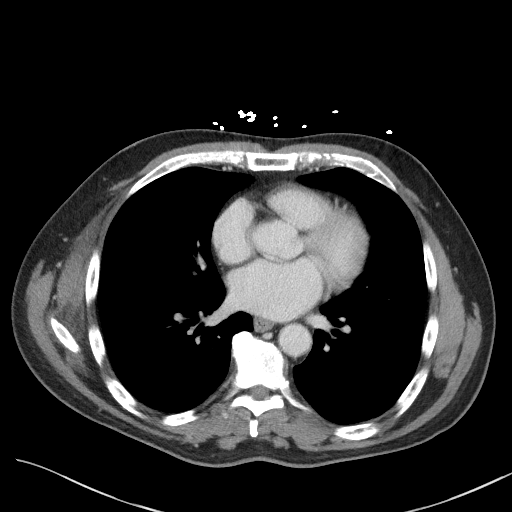
[im 97/103  soft-tissue]
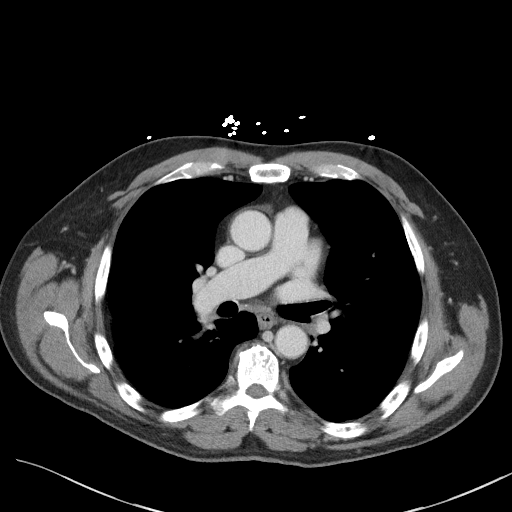

[Series 4: coronal st · coronal · 0.73mm/px · 3 of 110 slices shown]
[im 37/110  soft-tissue]
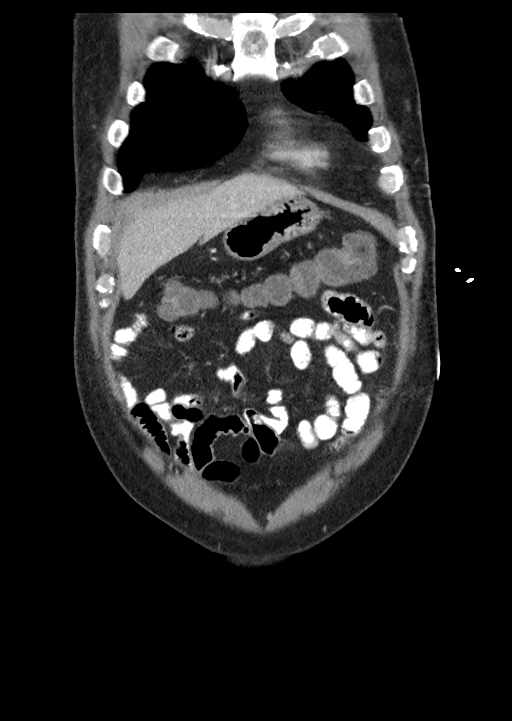
[im 49/110  soft-tissue]
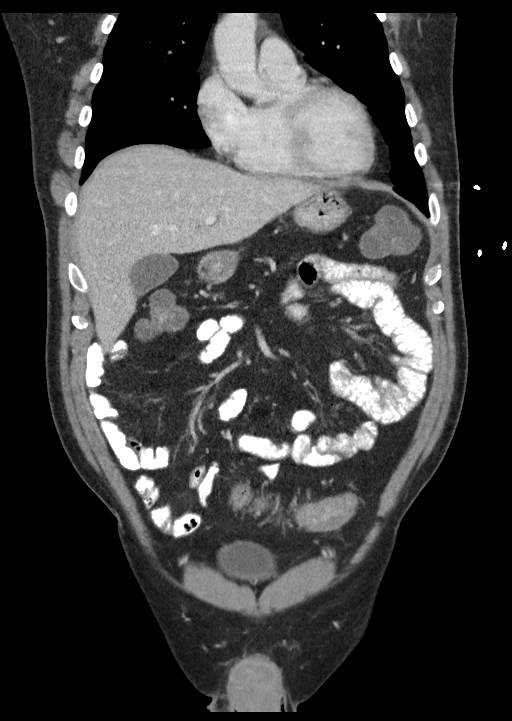
[im 61/110  soft-tissue]
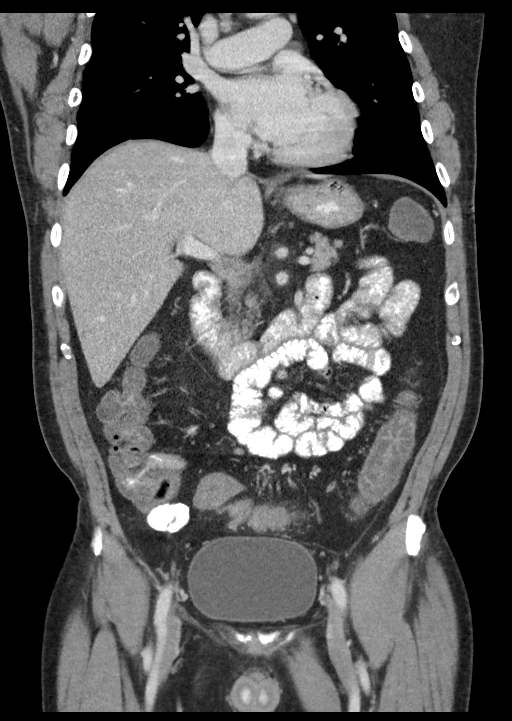

[15 of 46 positions shown; findings below may reference images not displayed]

FINDINGS: Lower chest: Focal consolidation in the right lung base is mildly
improved. Small esophageal hiatal hernia.

Hepatobiliary: Mild fatty infiltration in the liver. No focal liver
abnormality is seen. No gallstones, gallbladder wall thickening, or
biliary dilatation.

Pancreas: Unremarkable. No pancreatic ductal dilatation or
surrounding inflammatory changes.

Spleen: Normal in size without focal abnormality.

Adrenals/Urinary Tract: Adrenal glands are unremarkable. Kidneys are
normal, without renal calculi, focal lesion, or hydronephrosis.
Bladder is unremarkable.

Stomach/Bowel: Stomach and small bowel appear normal. Interval
development of edematous wall thickening involving the sigmoid and
descending colon suggesting regional colitis. Mild stranding around
the descending colon. This could represent infectious or
inflammatory etiologies. No findings of diverticulitis. Appendix is
normal.

Vascular/Lymphatic: No significant vascular findings are present. No
enlarged abdominal or pelvic lymph nodes.

Reproductive: Prostate gland is enlarged, measuring 4.9 cm diameter.

Other: No abdominal wall hernia or abnormality. No abdominopelvic
ascites.

Musculoskeletal: No acute or significant osseous findings.
IMPRESSION: Improvement of right basilar consolidation since previous study.
Interval development of edematous wall thickening of the descending
and sigmoid colon suggesting colitis, either infectious or
inflammatory. Prostate gland is enlarged. Mild fatty infiltration of
the liver.

## 2017-02-20 ENCOUNTER — Ambulatory Visit
Admission: RE | Admit: 2017-02-20 | Discharge: 2017-02-20 | Disposition: A | Discharge status: Home / Self Care | Payer: 59 | Source: Ambulatory Visit | Attending: Family | Admitting: Family

## 2017-02-20 DIAGNOSIS — R221 Localized swelling, mass and lump, neck: Secondary | ICD-10-CM

## 2017-03-01 DIAGNOSIS — K219 Gastro-esophageal reflux disease without esophagitis: Secondary | ICD-10-CM | POA: Insufficient documentation

## 2017-03-01 DIAGNOSIS — R070 Pain in throat: Secondary | ICD-10-CM | POA: Insufficient documentation

## 2017-03-01 DIAGNOSIS — J383 Other diseases of vocal cords: Secondary | ICD-10-CM | POA: Insufficient documentation

## 2017-03-13 ENCOUNTER — Emergency Department (HOSPITAL_COMMUNITY)
Admission: EM | Admit: 2017-03-13 | Discharge: 2017-03-14 | Disposition: A | Discharge status: Home / Self Care | Payer: 59 | Attending: Emergency Medicine | Admitting: Emergency Medicine

## 2017-03-13 ENCOUNTER — Telehealth: Payer: Self-pay | Admitting: Family

## 2017-03-13 ENCOUNTER — Encounter (HOSPITAL_COMMUNITY): Payer: Self-pay

## 2017-03-13 ENCOUNTER — Emergency Department (HOSPITAL_COMMUNITY): Payer: 59

## 2017-03-13 DIAGNOSIS — R253 Fasciculation: Secondary | ICD-10-CM | POA: Diagnosis not present

## 2017-03-13 DIAGNOSIS — R202 Paresthesia of skin: Secondary | ICD-10-CM | POA: Diagnosis not present

## 2017-03-13 DIAGNOSIS — Z79899 Other long term (current) drug therapy: Secondary | ICD-10-CM | POA: Insufficient documentation

## 2017-03-13 DIAGNOSIS — M79601 Pain in right arm: Secondary | ICD-10-CM | POA: Insufficient documentation

## 2017-03-13 DIAGNOSIS — R200 Anesthesia of skin: Secondary | ICD-10-CM

## 2017-03-13 DIAGNOSIS — E119 Type 2 diabetes mellitus without complications: Secondary | ICD-10-CM | POA: Diagnosis not present

## 2017-03-13 MED ORDER — MELOXICAM 7.5 MG PO TABS: 7.5000 mg | ORAL_TABLET | Freq: Every day | ORAL | 0 refills | Status: DC

## 2017-03-13 MED ORDER — CYCLOBENZAPRINE HCL 5 MG PO TABS: 5.0000 mg | ORAL_TABLET | Freq: Two times a day (BID) | ORAL | 0 refills | Status: DC | PRN

## 2017-03-13 MED ORDER — CYCLOBENZAPRINE HCL 10 MG PO TABS
5.0000 mg | ORAL_TABLET | Freq: Once | ORAL | Status: AC
  Administered 2017-03-13: 5 mg via ORAL
  Filled 2017-03-13: qty 1

## 2017-03-13 MED ORDER — KETOROLAC TROMETHAMINE 60 MG/2ML IM SOLN
60.0000 mg | Freq: Once | INTRAMUSCULAR | Status: AC
  Administered 2017-03-13: 60 mg via INTRAMUSCULAR
  Filled 2017-03-13: qty 2

## 2017-03-13 NOTE — ED Triage Notes (Signed)
Patient complains of right sided neck and arm pain x 2 weeks. Patient thinks related to neck and some intermittent with same in finger tips, denies trauma

## 2017-03-13 NOTE — ED Notes (Signed)
Patient transported to X-ray 

## 2017-03-13 NOTE — ED Notes (Signed)
Pt st's he has been having pain in the right side of his neck x's 2 weeks.  St's when he turns his head it causes pain to go down into his upper right back and right arm.  Pt also c/o tingling to right index and middle fingers.  Pt denies any injury

## 2017-03-13 NOTE — Telephone Encounter (Signed)
[facility] - Client TELEPHONE ADVICE RECORD [facility] Patient Name: Edward Mcgee DOB: 26-Nov-1955 Initial Comment Caller states calling for her father. Says he is complaining of pain on the right side of his body. Has already seen a doctor about this. Does has appointment for tomorrow as well. Today complaining of pain, numbness, tingling, and swelling of the hand. Nurse Assessment Nurse: Fransisco Hertz, RN, Elnita Maxwell Date/Time Lamount Cohen Time): 03/13/2017 3:37:46 PM Confirm and document reason for call. If symptomatic, describe symptoms. ---Caller states calling for her father. Says he is complaining of pain on the right side of his body. Has already seen a doctor about this. Does has appointment for tomorrow as well. Today complaining of pain, of right hand numbness, tingling, and swelling of the hand. Does the patient have any new or worsening symptoms? ---Yes Will a triage be completed? ---Yes Related visit to physician within the last 2 weeks? ---Yes Does the PT have any chronic conditions? (i.e. diabetes, asthma, etc.) ---No Is this a behavioral health or substance abuse call? ---No Guidelines Guideline Title Affirmed Question Affirmed Notes Neurologic Deficit [1] Numbness (i.e., loss of sensation) of the face, arm / hand, or leg / foot on one side of the body AND [2] sudden onset AND [3] present now Final Disposition User Call EMS 911 Now Fransisco Hertz, RN, [place] Referrals [facility] - ED Caller Disagree/Comply Comply Caller Understands Yes PreDisposition Go to Urgent Care/Walk-In Clinic  Call Id: [phone]

## 2017-03-13 NOTE — ED Provider Notes (Signed)
[facility]-EMERGENCY DEPT Provider Note   CSN: [id number] Arrival date & time: 03/13/17  1634     History   Chief Complaint No chief complaint on file.   HPI Edward Mcgee is a 61 y.o. male with a h/o of DM Who presents to the emergency department with a chief complaint of right arm pain with associated numbness that began 2 weeks ago.   He complains of constant pain that begins over the right triceps muscle and radiates as sharp pain down the arm. He reports the pain is worse in the morning and gradually improves throughout the day. He also complains of numbness to the first and second digits as well as the tip of the third finger. No recent trauma, injury, fever, chills, or left upper extremity symptoms. No neck pain, stiffness, or headache.   He has treated his symptoms with over-the-counter anti-inflammatories without improvement. He states that he called his primary care provider earlier today and was encouraged to come to the emergency department for evaluation.   He was evaluated about 3 weeks ago for a neck mass by his primary care provider. A left-sided soft tissue ultrasound of the neck demonstrated diffuse lymphadenopathy. He reports that his left-sided neck pain has since resolved.   A review of the patient's medical record indicates that he was evaluated for muscle cramps in December 2016. A BMP and magnesium level was ordered, which was unremarkable.   The history is provided by the patient. No language interpreter was used.    Past Medical History:  Diagnosis Date  . DM (diabetes mellitus), type 2, uncontrolled (HCC) 02/10/2013  . GERD (gastroesophageal reflux disease)     Patient Active Problem List   Diagnosis Date Noted  . Mass of neck 02/13/2017  . Seborrheic dermatitis of scalp 08/16/2016  . Colitis 05/10/2016  . CAP (community acquired pneumonia) 04/30/2016  . Testicular pain 12/05/2015  . Muscle cramps 05/27/2015  . Right knee pain 05/27/2015  . AV block, 1st  degree 02/10/2013  . DM (diabetes mellitus), type 2, uncontrolled (HCC) 02/10/2013  . Chest pain 02/09/2013  . LEG PAIN, RIGHT 04/18/2007  . HYPERLIPIDEMIA 02/25/2007  . DIABETES MELLITUS, BORDERLINE 07/09/2006  . BACK PAIN, LUMBAR, CHRONIC 05/31/2006    Past Surgical History:  Procedure Laterality Date  . NO PAST SURGERIES         Home Medications    Prior to Admission medications   Medication Sig Start Date End Date Taking? Authorizing Provider  Ciclopirox 1 % shampoo Apply 5-10 ml to wet hair; lather, and leave on hair and scalp for ~3 minutes; rinse. Repeat every 3rd day for up 4 weeks. 08/16/16   Veryl Speak, FNP  cyclobenzaprine (FLEXERIL) 5 MG tablet Take 1 tablet (5 mg total) by mouth 2 (two) times daily as needed for muscle spasms. 03/13/17   Coren Crownover A, PA-C  fluconazole (DIFLUCAN) 150 MG tablet Take 1 tablet (150 mg total) by mouth once a week. 08/16/16   Veryl Speak, FNP  meloxicam (MOBIC) 7.5 MG tablet Take 1 tablet (7.5 mg total) by mouth daily. 03/13/17   Zuria Fosdick A, PA-C    Family History Family History  Problem Relation Age of Onset  . Breast cancer Sister   . Breast cancer Sister   . Uterine cancer Sister   . Diabetes Other   . Colon cancer Neg Hx   . Esophageal cancer Neg Hx   . Rectal cancer Neg Hx   . Stomach cancer Neg Hx   .  Liver cancer Neg Hx     Social History Social History  Substance Use Topics  . Smoking status: Never Smoker  . Smokeless tobacco: Never Used  . Alcohol use Yes     Comment: socially     Allergies   Patient has no known allergies.   Review of Systems Review of Systems  Constitutional: Negative for activity change and fever.  Respiratory: Negative for shortness of breath.   Cardiovascular: Negative for chest pain.  Gastrointestinal: Negative for abdominal pain.  Musculoskeletal: Positive for myalgias. Negative for arthralgias, back pain, gait problem, joint swelling, neck pain and neck stiffness.    Skin: Negative for rash.  Allergic/Immunologic: Positive for immunocompromised state.  Neurological: Positive for numbness. Negative for dizziness, weakness, light-headedness and headaches.   Physical Exam Updated Vital Signs BP (!) 150/75 (BP Location: Right Arm)   Pulse 60   Temp 97.8 F (36.6 C) (Oral)   Resp 18   Ht  (1.626 m)   Wt 72.6 kg (160 lb)   SpO2 98%   BMI 27.46 kg/m   Physical Exam  Constitutional: He appears well-developed.  HENT:  Head: Normocephalic.  Eyes: Conjunctivae are normal.  Neck: Neck supple.  Cardiovascular: Normal rate and regular rhythm.   No murmur heard. Pulmonary/Chest: Effort normal.  Abdominal: Soft. He exhibits no distension.  Musculoskeletal:  Tender to palpation over the right triceps muscle and right extensor digitorum. Fasciculations also noted to the right triceps brachii and extensor digitorum. Sensation is intact throughout the right upper extremity hand, and fingers. Full range of motion of the right shoulder, elbow, wrist, and fingers. No increased pain with range of motion. Radial pulses are 2+ and symmetric. No weakness to the large muscle groups of the bilateral upper extremities against resistance.  Neurological: He is alert.  Skin: Skin is warm and dry.  Psychiatric: His behavior is normal.  Nursing note and vitals reviewed.  ED Treatments / Results  Labs (all labs ordered are listed, but only abnormal results are displayed) Labs Reviewed - No data to display  EKG  EKG Interpretation None       Radiology Dg Cervical Spine Complete  Result Date: 03/13/2017 CLINICAL DATA:  Right arm pain and numbness EXAM: CERVICAL SPINE - COMPLETE 4+ VIEW COMPARISON:  None. FINDINGS: Cervical alignment within normal limits. Mild degenerative changes at C5-C6 and C6-C7. Suggested foraminal narrowing of the mid cervical spine. Prevertebral soft tissue thickness is normal. Dens and lateral masses are unremarkable. IMPRESSION: No  acute osseous abnormality. Mild degenerative changes at C5-C6 and C6-C7 Electronically Signed   By: Jasmine Pang M.D.   On: 03/13/2017 22:57    Procedures Procedures (including critical care time)  Medications Ordered in ED Medications  cyclobenzaprine (FLEXERIL) tablet 5 mg (5 mg Oral Given 03/13/17 2221)  ketorolac (TORADOL) injection 60 mg (60 mg Intramuscular Given 03/13/17 2221)     Initial Impression / Assessment and Plan / ED Course  I have reviewed the triage vital signs and the nursing notes.  Pertinent labs & imaging results that were available during my care of the patient were reviewed by me and considered in my medical decision making (see chart for details).     61 year old male presenting with numbness over the radial nerve distribution of the right upper extremity with associated muscle fasciculation and atraumatic pain to the right triceps brachii and extensor digitorum x2 weeks. The patient was discussed with Dr. Criss Alvine, attending physician. On physical exam, no weakness is noted to  the large muscle groups of the right upper extremity against resistance. Vascularly intact. Cervical spine x-ray reveals mild degenerative changes at C5-C6 and C6-C7 with suggested foraminal narrowing of the mid cervical spine. No spondylolisthesis or underlying fracture. The patient's symptoms were treated with Flexeril and anti-inflammatories in the emergency department with moderate improvement of his pain. We will discharge the patient to home with Rx of the same. Avoid prednisone at this time since the patient has DM. Encouraged follow-up to his PCP if his symptoms do not start to improve within the next few days. Referral to neurology also given for possible follow up for EMG. Strict return precautions given. No acute distress. The patient is safe for discharge at this time.  Final Clinical Impressions(s) / ED Diagnoses   Final diagnoses:  Frequent fasciculation of muscle of extremity   Finger numbness    New Prescriptions Discharge Medication List as of 03/13/2017 11:32 PM    START taking these medications   Details  cyclobenzaprine (FLEXERIL) 5 MG tablet Take 1 tablet (5 mg total) by mouth 2 (two) times daily as needed for muscle spasms., Starting Wed 03/13/2017, Print    meloxicam (MOBIC) 7.5 MG tablet Take 1 tablet (7.5 mg total) by mouth daily., Starting Wed 03/13/2017, Print         Myrle Dues A, PA-C 03/14/17 0104    Gearldine Looney A, PA-C 03/14/17 4540    Pricilla Loveless, MD 03/15/17 [phone]

## 2017-03-13 NOTE — Discharge Instructions (Signed)
Flexeril may be taken once every 12 hours for muscle spasms or pain. Please do not drive or work after taking this medication because it can make you sleepy. Take 1 tablet of meloxicam every morning with food. Please do not take ibuprofen or naproxen while taking this medication because it can upset your stomach.  If your symptoms do not start to improve within the next week, you can follow-up with your primary care doctor first or call to make an appointment with neurology.  If you develop worsening symptoms, including weakness in the arm or hand where you are unable to hold objects in your hand or if you keep dropping things, if you develop a severe headache or chest pain, or if the arm becomes cold and you can no longer feel a pulse, please return to the emergency department for reevaluation.

## 2017-03-13 NOTE — ED Notes (Signed)
Pt to xray at this time.

## 2017-03-14 ENCOUNTER — Ambulatory Visit: Payer: 59 | Admitting: Family Medicine

## 2017-05-23 LAB — HM DIABETES EYE EXAM

## 2017-06-18 ENCOUNTER — Encounter: Payer: Self-pay | Admitting: Nurse Practitioner

## 2017-06-18 ENCOUNTER — Ambulatory Visit: Payer: 59 | Admitting: Nurse Practitioner

## 2017-06-18 VITALS — BP 122/76 | HR 71 | Temp 97.3°F | Ht 64.0 in | Wt 147.0 lb

## 2017-06-18 DIAGNOSIS — K2100 Gastro-esophageal reflux disease with esophagitis, without bleeding: Secondary | ICD-10-CM

## 2017-06-18 DIAGNOSIS — R070 Pain in throat: Secondary | ICD-10-CM

## 2017-06-18 MED ORDER — SUCRALFATE 1 G PO TABS: 1.0000 g | ORAL_TABLET | Freq: Two times a day (BID) | ORAL | 0 refills | Status: DC

## 2017-06-18 MED ORDER — OMEPRAZOLE 20 MG PO CPDR: 20.0000 mg | DELAYED_RELEASE_CAPSULE | Freq: Every day | ORAL | 0 refills | Status: DC

## 2017-06-18 NOTE — Patient Instructions (Addendum)
If no improvement in 70month, please f/up with ENT. Sammuel Hines MD (ENT with [facility]) [phone] (Work).   Heartburn Heartburn is a type of pain or discomfort that can happen in the throat or chest. It is often described as a burning pain. It may also cause a bad taste in the mouth. Heartburn may feel worse when you lie down or bend over, and it is often worse at night. Heartburn may be caused by stomach contents that move back up into the esophagus (reflux). Follow these instructions at home: Take these actions to decrease your discomfort and to help avoid complications. Diet  Follow a diet as recommended by your health care provider. This may involve avoiding foods and drinks such as: ? Coffee and tea (with or without caffeine). ? Drinks that contain alcohol. ? Energy drinks and sports drinks. ? Carbonated drinks or sodas. ? Chocolate and cocoa. ? Peppermint and mint flavorings. ? Garlic and onions. ? Horseradish. ? Spicy and acidic foods, including peppers, chili powder, curry powder, vinegar, hot sauces, and barbecue sauce. ? Citrus fruit juices and citrus fruits, such as oranges, lemons, and limes. ? Tomato-based foods, such as red sauce, chili, salsa, and pizza with red sauce. ? Fried and fatty foods, such as donuts, french fries, potato chips, and high-fat dressings. ? High-fat meats, such as hot dogs and fatty cuts of red and white meats, such as rib eye steak, sausage, ham, and bacon. ? High-fat dairy items, such as whole milk, butter, and cream cheese.  Eat small, frequent meals instead of large meals.  Avoid drinking large amounts of liquid with your meals.  Avoid eating meals during the 2-3 hours before bedtime.  Avoid lying down right after you eat.  Do not exercise right after you eat. General instructions  Pay attention to any changes in your symptoms.  Take over-the-counter and prescription medicines only as told by your health care  provider. Do not take aspirin, ibuprofen, or other NSAIDs unless your health care provider told you to do so.  Do not use any tobacco products, including cigarettes, chewing tobacco, and e-cigarettes. If you need help quitting, ask your health care provider.  Wear loose-fitting clothing. Do not wear anything tight around your waist that causes pressure on your abdomen.  Raise (elevate) the head of your bed about 6 inches (15 cm).  Try to reduce your stress, such as with yoga or meditation. If you need help reducing stress, ask your health care provider.  If you are overweight, reduce your weight to an amount that is healthy for you. Ask your health care provider for guidance about a safe weight loss goal.  Keep all follow-up visits as told by your health care provider. This is important. Contact a health care provider if:  You have new symptoms.  You have unexplained weight loss.  You have difficulty swallowing, or it hurts to swallow.  You have wheezing or a persistent cough.  Your symptoms do not improve with treatment.  You have frequent heartburn for more than two weeks. Get help right away if:  You have pain in your arms, neck, jaw, teeth, or back.  You feel sweaty, dizzy, or light-headed.  You have chest pain or shortness of breath.  You vomit and your vomit looks like blood or coffee grounds.  Your stool is bloody or black. This information is not intended to replace advice given to you by your health care provider. Make sure you discuss any questions you  have with your health care provider. Document Released: 10/14/2008 Document Revised: 11/03/2015 Document Reviewed: 09/22/2014 Elsevier Interactive Patient Education  [employer]2018 [employer]

## 2017-06-18 NOTE — Progress Notes (Signed)
Subjective:  Patient ID: Edward Mcgee, male    DOB: 12-04-1955  Age: 62 y.o. MRN: [id number]  CC: Establish Care (est care--throat pain--had endoscopy and heart burn med but not helping--pain comes and goes./flu shot?)   Gastroesophageal Reflux  He complains of globus sensation, heartburn and a sore throat. He reports no abdominal pain, no chest pain, no choking, no coughing, no dysphagia, no early satiety, no nausea, no water brash or no wheezing. This is a chronic problem. The current episode started more than 1 year ago. The problem occurs constantly. The problem has been unchanged. The heartburn is located in the substernum. The heartburn is of mild intensity. The heartburn does not wake him from sleep. The heartburn does not limit his activity. The heartburn doesn't change with position. Pertinent negatives include no anemia, fatigue, melena, muscle weakness, orthopnea or weight loss. He has tried a PPI for the symptoms. The treatment provided no relief. Past procedures include an EGD and H. pylori antibody titer.   Persistent throat pain despite use of omeprazole x 2months. Evaluated by ENT with [facility].  Outpatient Medications Prior to Visit  Medication Sig Dispense Refill  . omeprazole (PRILOSEC) 40 MG capsule Take by mouth.    . Ciclopirox 1 % shampoo Apply 5-10 ml to wet hair; lather, and leave on hair and scalp for ~3 minutes; rinse. Repeat every 3rd day for up 4 weeks. (Patient not taking: Reported on 06/18/2017) 120 mL 0  . cyclobenzaprine (FLEXERIL) 5 MG tablet Take 1 tablet (5 mg total) by mouth 2 (two) times daily as needed for muscle spasms. (Patient not taking: Reported on 06/18/2017) 14 tablet 0  . fluconazole (DIFLUCAN) 150 MG tablet Take 1 tablet (150 mg total) by mouth once a week. (Patient not taking: Reported on 06/18/2017) 2 tablet 0  . meloxicam (MOBIC) 7.5 MG tablet Take 1 tablet (7.5 mg total) by mouth daily. (Patient not taking: Reported on 06/18/2017) 14 tablet 0    Facility-Administered Medications Prior to Visit  Medication Dose Route Frequency Provider Last Rate Last Dose  . 0.9 %  sodium chloride infusion  500 mL Intravenous Continuous Rachael Fee, MD        ROS Review of Systems  Constitutional: Negative.  Negative for fatigue and weight loss.  HENT: Positive for sore throat.   Respiratory: Negative.  Negative for cough, choking and wheezing.   Cardiovascular: Negative for chest pain.  Gastrointestinal: Positive for heartburn. Negative for abdominal pain, dysphagia, melena and nausea.  Musculoskeletal: Negative for muscle weakness.  Skin: Negative.     Objective:  BP 122/76   Pulse 71   Temp (!) 97.3 F (36.3 C) (Oral)   Ht 5\' 4"  (1.626 m)   Wt 147 lb (66.7 kg)   SpO2 95%   BMI 25.23 kg/m   BP Readings from Last 3 Encounters:  06/18/17 122/76  03/14/17 (!) 150/75  02/13/17 134/82    Wt Readings from Last 3 Encounters:  06/18/17 147 lb (66.7 kg)  03/13/17 160 lb (72.6 kg)  02/13/17 160 lb (72.6 kg)    Physical Exam  Constitutional: He is oriented to person, place, and time. No distress.  Neck: Normal range of motion. Neck supple. No thyromegaly present.  Cardiovascular: Normal rate and regular rhythm.  Pulmonary/Chest: Effort normal and breath sounds normal.  Musculoskeletal: He exhibits no edema.  Lymphadenopathy:    He has no cervical adenopathy.  Neurological: He is alert and oriented to person, place, and time.  Vitals  reviewed.   Lab Results  Component Value Date   WBC 6.1 02/13/2017   HGB 14.4 02/13/2017   HCT 44.2 02/13/2017   PLT 280.0 02/13/2017   GLUCOSE 184 (H) 06/18/2016   CHOL 226 (H) 02/09/2013   TRIG 187 (H) 02/09/2013   HDL 66 02/09/2013   LDLCALC 123 (H) 02/09/2013   ALT 30 06/18/2016   AST 24 06/18/2016   NA 136 06/18/2016   K 3.9 06/18/2016   CL 101 06/18/2016   CREATININE 0.88 06/18/2016   BUN 17 06/18/2016   CO2 25 06/18/2016   PSA done 07/09/2006   HGBA1C 7.7 (H) 05/27/2015     Dg Cervical Spine Complete  Result Date: 03/13/2017 CLINICAL DATA:  Right arm pain and numbness EXAM: CERVICAL SPINE - COMPLETE 4+ VIEW COMPARISON:  None. FINDINGS: Cervical alignment within normal limits. Mild degenerative changes at C5-C6 and C6-C7. Suggested foraminal narrowing of the mid cervical spine. Prevertebral soft tissue thickness is normal. Dens and lateral masses are unremarkable. IMPRESSION: No acute osseous abnormality. Mild degenerative changes at C5-C6 and C6-C7 Electronically Signed   By: Jasmine PangKim  Fujinaga M.D.   On: 03/13/2017 22:57    Assessment & Plan:   Bernardino was seen today for establish care.  Diagnoses and all orders for this visit:  Gastroesophageal reflux disease with esophagitis -     omeprazole (PRILOSEC) 20 MG capsule; Take 1 capsule (20 mg total) by mouth daily. -     sucralfate (CARAFATE) 1 g tablet; Take 1 tablet (1 g total) by mouth 2 (two) times daily.  Throat pain -     omeprazole (PRILOSEC) 20 MG capsule; Take 1 capsule (20 mg total) by mouth daily. -     sucralfate (CARAFATE) 1 g tablet; Take 1 tablet (1 g total) by mouth 2 (two) times daily.   I have discontinued Ander Viveros's Ciclopirox, fluconazole, cyclobenzaprine, and meloxicam. I have also changed his omeprazole. Additionally, I am having him start on sucralfate. We will continue to administer sodium chloride.  Meds ordered this encounter  Medications  . omeprazole (PRILOSEC) 20 MG capsule    Sig: Take 1 capsule (20 mg total) by mouth daily.    Dispense:  30 capsule    Refill:  0    Order Specific Question:   Supervising Provider    Answer:   Dianne DunARON, TALIA M [[id number]]  . sucralfate (CARAFATE) 1 g tablet    Sig: Take 1 tablet (1 g total) by mouth 2 (two) times daily.    Dispense:  60 tablet    Refill:  0    Order Specific Question:   Supervising Provider    Answer:   Dianne DunARON, TALIA M [[id number]]    Follow-up: Return in about 4 weeks (around 07/16/2017) for CPE (fasting).  Alysia Pennaharlotte Maegan Buller, NP

## 2017-06-21 ENCOUNTER — Encounter: Payer: Self-pay | Admitting: Nurse Practitioner

## 2017-07-16 ENCOUNTER — Encounter: Payer: Self-pay | Admitting: Nurse Practitioner

## 2017-07-16 ENCOUNTER — Ambulatory Visit (INDEPENDENT_AMBULATORY_CARE_PROVIDER_SITE_OTHER): Payer: 59 | Admitting: Nurse Practitioner

## 2017-07-16 VITALS — BP 140/80 | HR 60 | Temp 97.9°F | Ht 64.0 in | Wt 156.0 lb

## 2017-07-16 DIAGNOSIS — Z23 Encounter for immunization: Secondary | ICD-10-CM

## 2017-07-16 DIAGNOSIS — E782 Mixed hyperlipidemia: Secondary | ICD-10-CM

## 2017-07-16 DIAGNOSIS — Z0001 Encounter for general adult medical examination with abnormal findings: Secondary | ICD-10-CM | POA: Diagnosis not present

## 2017-07-16 DIAGNOSIS — K2100 Gastro-esophageal reflux disease with esophagitis, without bleeding: Secondary | ICD-10-CM

## 2017-07-16 DIAGNOSIS — R070 Pain in throat: Secondary | ICD-10-CM | POA: Diagnosis not present

## 2017-07-16 DIAGNOSIS — E1165 Type 2 diabetes mellitus with hyperglycemia: Secondary | ICD-10-CM | POA: Diagnosis not present

## 2017-07-16 LAB — COMPREHENSIVE METABOLIC PANEL
ALK PHOS: 72 U/L (ref 39–117)
ALT: 18 U/L (ref 0–53)
AST: 14 U/L (ref 0–37)
Albumin: 4.1 g/dL (ref 3.5–5.2)
BUN: 17 mg/dL (ref 6–23)
CO2: 31 mEq/L (ref 19–32)
Calcium: 9.3 mg/dL (ref 8.4–10.5)
Chloride: 102 mEq/L (ref 96–112)
Creatinine, Ser: 0.83 mg/dL (ref 0.40–1.50)
GFR: 99.49 mL/min (ref >60.00)
GLUCOSE: 230 mg/dL — AB (ref 70–99)
POTASSIUM: 4.1 meq/L (ref 3.5–5.1)
Sodium: 140 mEq/L (ref 135–145)
TOTAL PROTEIN: 6.9 g/dL (ref 6.0–8.3)
Total Bilirubin: 0.5 mg/dL (ref 0.2–1.2)

## 2017-07-16 LAB — CBC
HCT: 41.7 % (ref 39.0–52.0)
HEMOGLOBIN: 13.9 g/dL (ref 13.0–17.0)
MCHC: 33.5 g/dL (ref 30.0–36.0)
MCV: 83.4 fl (ref 78.0–100.0)
PLATELETS: 277 10*3/uL (ref 150.0–400.0)
RBC: 4.99 Mil/uL (ref 4.22–5.81)
RDW: 12.8 % (ref 11.5–15.5)
WBC: 5.1 10*3/uL (ref 4.0–10.5)

## 2017-07-16 LAB — LIPID PANEL
Cholesterol: 192 mg/dL (ref 0–200)
HDL: 66.8 mg/dL (ref >39.00)
LDL Cholesterol: 106 mg/dL — ABNORMAL HIGH (ref 0–99)
NONHDL: 125.37
Total CHOL/HDL Ratio: 3
Triglycerides: 96 mg/dL (ref 0.0–149.0)
VLDL: 19.2 mg/dL (ref 0.0–40.0)

## 2017-07-16 LAB — MICROALBUMIN / CREATININE URINE RATIO
Creatinine,U: 162.7 mg/dL
MICROALB/CREAT RATIO: 0.4 mg/g (ref 0.0–30.0)
Microalb, Ur: <0.7 mg/dL (ref 0.0–1.9)

## 2017-07-16 LAB — HEMOGLOBIN A1C: HEMOGLOBIN A1C: 11.1 % — AB (ref 4.6–6.5)

## 2017-07-16 LAB — TSH: TSH: 1.75 u[IU]/mL (ref 0.35–4.50)

## 2017-07-16 MED ORDER — BLOOD GLUCOSE MONITOR KIT: PACK | 0 refills | Status: DC

## 2017-07-16 MED ORDER — SUCRALFATE 1 G PO TABS: 1.0000 g | ORAL_TABLET | Freq: Two times a day (BID) | ORAL | 3 refills | Status: DC | PRN

## 2017-07-16 MED ORDER — OMEPRAZOLE 20 MG PO CPDR: 20.0000 mg | DELAYED_RELEASE_CAPSULE | Freq: Every day | ORAL | 1 refills | Status: DC

## 2017-07-16 MED ORDER — GLIPIZIDE-METFORMIN HCL 2.5-250 MG PO TABS: 1.0000 | ORAL_TABLET | Freq: Two times a day (BID) | ORAL | 3 refills | Status: DC

## 2017-07-16 NOTE — Patient Instructions (Addendum)
Please have ophthalmology and urology report faxed to me.  encourage adequate oral hydration with water mostly (8-10 glasses per day).  Maintain low carb/low sugar diet.  Stable lipid panel. No medication needed at this time. CMP indicates stable renal and hepatic function, but elevated glucose duet to uncontrolled DM. Need to start oral medications (RX sent). Also sent glucometer. Normal urine microalbumin, TSH, and CBC. return to office in 3months.  Diabetes mellitus y nutricin Diabetes Mellitus and Nutrition Si sufre de diabetes (diabetes mellitus), es muy importante tener hbitos alimenticios saludables debido a que sus niveles de Psychologist, counsellingazcar en la sangre (glucosa) se ven afectados en gran medida por lo que come y bebe. [place] alimentos saludables en las cantidades Portiaadecuadas, aproximadamente a la Smith Internationalmisma hora todos los das, [place] ayudar a:  Scientist, physiologicalControlar la glucemia.  Disminuir el riesgo de sufrir una enfermedad cardaca.  Mejorar la presin arterial.  BaristaAlcanzar o mantener un peso saludable.  Todas las personas que sufren de diabetes son diferentes y cada una tiene necesidades diferentes en cuanto a un plan de alimentacin. El mdico puede recomendarle que trabaje con un especialista en dietas y nutricin (nutricionista) para Tax adviserelaborar el mejor plan para usted. Su plan de alimentacin puede variar segn factores como:  Las caloras que necesita.  Los medicamentos que toma.  Su peso.  Sus niveles de glucemia, presin arterial y colesterol.  Su nivel de [place]inesactividad.  Otras afecciones que tenga, como enfermedades cardacas o renales.  Cmo me afectan los carbohidratos? Los carbohidratos afectan el nivel de glucemia ms que cualquier otro tipo de alimento. La ingesta de carbohidratos naturalmente aumenta la cantidad glucosa en la sangre. El recuento de carbohidratos es un mtodo destinado a Midwifellevar un registro de la cantidad de carbohidratos que se ingieren. El recuento de carbohidratos es  importante para Pharmacologistmantener la glucemia a un nivel saludable, en especial si utiliza insulina o toma determinados medicamentos por va oral para la diabetes. Es importante saber la cantidad de carbohidratos que se pueden ingerir en cada comida sin correr Surveyor, mineralsningn riesgo. Esto es Government social research officerdiferente en cada persona. El nutricionista puede ayudarlo a calcular la cantidad de carbohidratos que debe ingerir en cada comida y colacin. Los alimentos que contienen carbohidratos incluyen:  Pan, cereal, arroz, pasta y galletas.  Papas y maz.  Guisantes, frijoles y lentejas.  Leche y Dentistyogur.  Nils PyleFrutas y Sloveniajugo.  Postres, como pasteles, galletitas, helado y caramelos.  Cmo me afecta el alcohol? El alcohol puede provocar disminuciones sbitas de la glucemia (hipoglucemia), en especial si utiliza insulina o toma determinados medicamentos por va oral para la diabetes. La hipoglucemia es una afeccin potencialmente mortal. Los sntomas de la hipoglucemia (somnolencia, mareos y confusin) son similares a los sntomas de haber consumido demasiado alcohol. Si el mdico afirma que el alcohol es seguro para usted, siga estas pautas:  Limite el consumo de alcohol a no ms de 1 medida por da si es mujer y no est Orthoptistembarazada, y a 2 medidas si es hombre. Una medida equivale a 12oz (355ml) de cerveza, 5oz (148ml) de vino o 1oz (44ml) de bebidas de alta graduacin alcohlica.  No beba con el estmago vaco.  Mantngase hidratado con agua, gaseosas dietticas o t helado sin azcar.  Tenga en cuenta que las gaseosas comunes, los jugos y otros refrescos pueden contener mucha azcar y se deben contar como carbohidratos.  Consejos para seguir Consulting civil engineereste plan Leer las etiquetas de los alimentos  Comience por controlar el tamao de la porcin en la etiqueta. La cantidad  de caloras, carbohidratos, grasas y otros nutrientes mencionados en la etiqueta se basan en una porcin del alimento. Muchos alimentos contienen ms de una porcin  por envase.  Verifique la cantidad total de gramos (g) de carbohidratos totales en una porcin. Puede calcular la cantidad de porciones de carbohidratos al dividir el total de carbohidratos por 15. Por ejemplo, si un alimento posee un total de 30g de carbohidratos, equivale a 2 porciones de carbohidratos.  Verifique la cantidad de gramos (g) de grasas saturadas y grasas trans en una porcin. Escoja alimentos que no contengan grasa o que tengan un bajo contenido.  Controle la cantidad de miligramos (mg) de sodio en una porcin. La [employer] de las personas deben limitar la ingesta de sodio total a menos de 2300mg  por Futures trader.  Siempre consulte la informacin nutricional de los alimentos etiquetados como "con bajo contenido de grasa" o "sin grasa". Estos alimentos pueden ser ms altos en azcar agregada o en carbohidratos refinados y deben evitarse.  Hable con el nutricionista para identificar sus objetivos diarios en cuanto a los nutrientes mencionados en la etiqueta. De compras  Evite comprar alimentos procesados, enlatados o prehechos. Estos alimentos tienden a Civil Service fast streamer cantidad de [place], sodio y azcar agregada.  Compre en la zona exterior de la tienda de comestibles. Esta incluye frutas y Medtronic, granos a granel, carnes frescas y productos lcteos frescos. Coccin  Utilice mtodos de coccin a baja temperatura, como hornear, en lugar de mtodos de coccin a alta temperatura, como frer en abundante aceite.  Cocine con aceites saludables, como el aceite de [place], canola o [place].  Evite cocinar con manteca, crema o carnes con alto contenido de grasa. Planificacin de las comidas  [employer] comidas y las colaciones de forma regular, preferentemente a la misma hora todos [place]. Evite pasar largos perodos de tiempo sin comer.  Consuma alimentos ricos en fibra, como frutas frescas, verduras, frijoles y cereales integrales. Consulte al nutricionista sobre cuntas porciones de  carbohidratos puede consumir en cada comida.  Consuma entre 4 y 6 onzas de protenas magras por da, como carnes [place], pollo, pescado, [employer] o tofu. 1 onza equivale a 1 onza de carne, pollo o pescado, 1 huevo, o 1/4 taza de tofu.  Coma algunos alimentos por da que contengan grasas saludables, como aguacates, frutos secos, semillas y pescado. Estilo de vida   Controle su nivel de glucemia con regularidad.  Haga ejercicio al menos , 5das o ms por semana, o como se lo haya indicado el mdico.  Tome los Monsanto Company se lo haya indicado el mdico.  No consuma ningn producto que contenga nicotina o tabaco, como cigarrillos y Administrator, Civil Service. Si necesita ayuda para dejar de fumar, consulte al CIGNA con un asesor o instructor en diabetes para identificar estrategias para controlar el estrs y cualquier desafo emocional y social. Cules son algunas de las preguntas que puedo hacerle a mi mdico?  Es necesario que me rena con IT trainer en diabetes?  Es necesario que me rena con un nutricionista?  A qu nmero puedo llamar si tengo preguntas?  Cules son los mejores momentos para controlar la glucemia? Dnde encontrar ms informacin:  Asociacin Americana de la Diabetes (American Diabetes Association): diabetes.org/food-and-fitness/food  Academia de Nutricin y Pension scheme manager (Academy of Nutrition and Dietetics): https://www.vargas.com/  [employer] de la Diabetes y las Enfermedades Digestivas y Special educational needs teacher [facility] of Diabetes and Digestive and Kidney Diseases) (Institutos Nacionales de [place], NIH): FindJewelers.cz Resumen  Un plan de alimentacin  saludable lo ayudar a controlar la glucemia y Pharmacologist un estilo de vida saludable.  Trabajar con un especialista en dietas y nutricin (nutricionista) puede ayudarlo a  Designer, television/film set de alimentacin para usted.  Tenga en cuenta que los carbohidratos y el alcohol tienen efectos inmediatos en sus niveles de glucemia. Es importante contar los carbohidratos y consumir alcohol con prudencia. Esta informacin no tiene Theme park manager el consejo del mdico. Asegrese de hacerle al mdico cualquier pregunta que tenga. Document Released: 09/04/2007 Document Revised: 09/17/2016 Document Reviewed: 09/17/2016 Elsevier Interactive Patient Education  2018 [employer].

## 2017-07-16 NOTE — Progress Notes (Signed)
Subjective:    Patient ID: Edward Mcgee, male    DOB: [birth year], 62 y.o.   MRN: [id number]  Patient presents today for complete physical  HPI  GERD and throat pain: Improved throat pain with omeprazole and sucralfate. Will like to continue both medications.  DM: Uncontrolled. No medication used at this time due to GI side effects.  Immunizations: (TDAP, Hep C screen, Pneumovax, Influenza, zoster)  Health Maintenance  Topic Date Due  .  Hepatitis C: One time screening is recommended by Center for Disease Control  (CDC) for  adults born from [birth year] through 1966.   07-09-1955  . Pneumococcal vaccine (2) 04/17/2012  . Eye exam for diabetics  06/28/2016  . HIV Screening  07/16/2018*  . Hemoglobin A1C  01/13/2018  . Complete foot exam   07/16/2018  . Urine Protein Check  07/16/2018  . Tetanus Vaccine  06/14/2024  . Colon Cancer Screening  06/25/2026  . Flu Shot  Completed  *Topic was postponed. The date shown is not the original due date.   Diet:regular.  Weight:  Wt Readings from Last 3 Encounters:  07/16/17 156 lb (70.8 kg)  06/18/17 147 lb (66.7 kg)  03/13/17 160 lb (72.6 kg)    Exercise:walking.  Fall Risk: Fall Risk  06/18/2017  Falls in the past year? No   Home Safety:home with wife.  Depression/Suicide: Depression screen PHQ 2/9 06/18/2017  Decreased Interest 0  Down, Depressed, Hopeless 0  PHQ - 2 Score 0   PSA (yearly, >49yr):done by urology last year patient patient and wife.  Vision:done 05/2017, report requested.  Dental:needed, will schedule.  Advanced Directive: Advanced Directives 03/13/2017  Does Patient Have a Medical Advance Directive? No  Would patient like information on creating a medical advance directive? No - Patient declined    Medications and allergies reviewed with patient and updated if appropriate.  Patient Active Problem List   Diagnosis Date Noted  . Vocal process granuloma 03/01/2017  . Laryngopharyngeal reflux (LPR)  03/01/2017  . Seborrheic dermatitis of scalp 08/16/2016  . Muscle cramps 05/27/2015  . AV block, 1st degree 02/10/2013  . DM (diabetes mellitus), type 2, uncontrolled ([facility] 02/10/2013  . Hyperlipidemia 02/25/2007  . BACK PAIN, LUMBAR, CHRONIC 05/31/2006    No current outpatient medications on file prior to visit.   Current Facility-Administered Medications on File Prior to Visit  Medication Dose Route Frequency Provider Last Rate Last Dose  . 0.9 %  sodium chloride infusion  500 mL Intravenous Continuous JMilus Banister MD        Past Medical History:  Diagnosis Date  . DM (diabetes mellitus), type 2, uncontrolled ([facility] 02/10/2013  . GERD (gastroesophageal reflux disease)     Past Surgical History:  Procedure Laterality Date  . NO PAST SURGERIES      Social History   Socioeconomic History  . Marital status: Married    Spouse name: None  . Number of children: 3  . Years of education: 19 . Highest education level: None  Social Needs  . Financial resource strain: None  . Food insecurity - worry: None  . Food insecurity - inability: None  . Transportation needs - medical: None  . Transportation needs - non-medical: None  Occupational History  . Occupation: Maintenance  Tobacco Use  . Smoking status: Never Smoker  . Smokeless tobacco: Never Used  Substance and Sexual Activity  . Alcohol use: Yes    Comment: socially  . Drug use: No  . Sexual activity:  None  Other Topics Concern  . None  Social History Narrative  . None    Family History  Problem Relation Age of Onset  . Breast cancer Sister   . Breast cancer Sister   . Uterine cancer Sister   . Diabetes Other   . Colon cancer Neg Hx   . Esophageal cancer Neg Hx   . Rectal cancer Neg Hx   . Stomach cancer Neg Hx   . Liver cancer Neg Hx         Review of Systems  Constitutional: Negative for fever, malaise/fatigue and weight loss.  HENT: Negative for congestion and sore throat.   Eyes:        Negative for visual changes  Respiratory: Negative for cough and shortness of breath.   Cardiovascular: Negative for chest pain, palpitations and leg swelling.  Gastrointestinal: Negative for blood in stool, constipation, diarrhea and heartburn.  Genitourinary: Negative for dysuria, frequency and urgency.  Musculoskeletal: Negative for falls, joint pain and myalgias.  Skin: Negative for rash.  Neurological: Negative for dizziness, sensory change and headaches.  Endo/Heme/Allergies: Does not bruise/bleed easily.  Psychiatric/Behavioral: Negative for depression, substance abuse and suicidal ideas. The patient is not nervous/anxious.     Objective:   Vitals:   07/16/17 0809  BP: 140/80  Pulse: 60  Temp: 97.9 F (36.6 C)  SpO2: 95%    Body mass index is 26.78 kg/m.   Physical Examination:  Physical Exam  Constitutional: He is oriented to person, place, and time and well-developed, well-nourished, and in no distress. No distress.  HENT:  Right Ear: External ear normal.  Left Ear: External ear normal.  Nose: Nose normal.  Mouth/Throat: Oropharynx is clear and moist. No oropharyngeal exudate.  Eyes: Conjunctivae and EOM are normal. Pupils are equal, round, and reactive to light. No scleral icterus.  Neck: Normal range of motion. Neck supple. No thyromegaly present.  Cardiovascular: Normal rate, normal heart sounds and intact distal pulses.  Pulmonary/Chest: Effort normal and breath sounds normal. He exhibits no tenderness.  Abdominal: Soft. Bowel sounds are normal. He exhibits no distension. There is no tenderness.  Musculoskeletal: Normal range of motion. He exhibits no edema or tenderness.  Lymphadenopathy:    He has no cervical adenopathy.  Neurological: He is alert and oriented to person, place, and time. Gait normal.  Skin: Skin is warm and dry.  Psychiatric: Affect and judgment normal.    ASSESSMENT and PLAN:  Edward Mcgee was seen today for annual exam.  Diagnoses and  all orders for this visit:  Encounter for preventative adult health care exam with abnormal findings -     TSH -     Comprehensive metabolic panel -     CBC  Uncontrolled type 2 diabetes mellitus with hyperglycemia (HCC) -     Hemoglobin A1c -     Microalbumin / creatinine urine ratio -     blood glucose meter kit and supplies KIT; Dispense based on patient and insurance preference. Use before breakfast and at bedtime. ICD E11.9 -     glipizide-metformin (METAGLIP) 2.5-250 MG tablet; Take 1 tablet by mouth 2 (two) times daily after a meal.  Mixed hyperlipidemia -     Lipid panel  Gastroesophageal reflux disease with esophagitis -     sucralfate (CARAFATE) 1 g tablet; Take 1 tablet (1 g total) by mouth 2 (two) times daily between meals as needed. -     omeprazole (PRILOSEC) 20 MG capsule; Take 1 capsule (20 mg  total) by mouth daily.  Throat pain -     sucralfate (CARAFATE) 1 g tablet; Take 1 tablet (1 g total) by mouth 2 (two) times daily between meals as needed. -     omeprazole (PRILOSEC) 20 MG capsule; Take 1 capsule (20 mg total) by mouth daily.  Need for influenza vaccination -     Flu vaccine HIGH DOSE PF  Other orders -     Cancel: Pneumococcal polysaccharide vaccine 23-valent greater than or equal to 2yo subcutaneous/IM    No problem-specific Assessment & Plan notes found for this encounter.     Recent Results (from the past 2160 hour(s))  TSH     Status: None   Collection Time: 07/16/17  9:04 AM  Result Value Ref Range   TSH 1.75 0.35 - 4.50 uIU/mL  Hemoglobin A1c     Status: Abnormal   Collection Time: 07/16/17  9:04 AM  Result Value Ref Range   Hgb A1c MFr Bld 11.1 (H) 4.6 - 6.5 %    Comment: Glycemic Control Guidelines for People with Diabetes:Non Diabetic:  <6%Goal of Therapy: <7%Additional Action Suggested:  >8%   Lipid panel     Status: Abnormal   Collection Time: 07/16/17  9:04 AM  Result Value Ref Range   Cholesterol 192 0 - 200 mg/dL    Comment: ATP  III Classification       Desirable:  < 200 mg/dL               Borderline High:  200 - 239 mg/dL          High:  > = 240 mg/dL   Triglycerides 96.0 0.0 - 149.0 mg/dL    Comment: Normal:  <150 mg/dLBorderline High:  150 - 199 mg/dL   HDL 66.80 >39.00 mg/dL   VLDL 19.2 0.0 - 40.0 mg/dL   LDL Cholesterol 106 (H) 0 - 99 mg/dL   Total CHOL/HDL Ratio 3     Comment:                Men          Women1/2 Average Risk     3.4          3.3Average Risk          5.0          4.42X Average Risk          9.6          7.13X Average Risk          15.0          11.0                       NonHDL 125.37     Comment: NOTE:  Non-HDL goal should be 30 mg/dL higher than patient's LDL goal (i.e. LDL goal of < 70 mg/dL, would have non-HDL goal of < 100 mg/dL)  Comprehensive metabolic panel     Status: Abnormal   Collection Time: 07/16/17  9:04 AM  Result Value Ref Range   Sodium 140 135 - 145 mEq/L   Potassium 4.1 3.5 - 5.1 mEq/L   Chloride 102 96 - 112 mEq/L   CO2 31 19 - 32 mEq/L   Glucose, Bld 230 (H) 70 - 99 mg/dL   BUN 17 6 - 23 mg/dL   Creatinine, Ser 0.83 0.40 - 1.50 mg/dL   Total Bilirubin 0.5 0.2 - 1.2 mg/dL   Alkaline Phosphatase 72 39 -  117 U/L   AST 14 0 - 37 U/L   ALT 18 0 - 53 U/L   Total Protein 6.9 6.0 - 8.3 g/dL   Albumin 4.1 3.5 - 5.2 g/dL   Calcium 9.3 8.4 - 10.5 mg/dL   GFR 99.49 >60.00 mL/min  CBC     Status: None   Collection Time: 07/16/17  9:04 AM  Result Value Ref Range   WBC 5.1 4.0 - 10.5 K/uL   RBC 4.99 4.22 - 5.81 Mil/uL   Platelets 277.0 150.0 - 400.0 K/uL   Hemoglobin 13.9 13.0 - 17.0 g/dL   HCT 41.7 39.0 - 52.0 %   MCV 83.4 78.0 - 100.0 fl   MCHC 33.5 30.0 - 36.0 g/dL   RDW 12.8 11.5 - 15.5 %  Microalbumin / creatinine urine ratio     Status: None   Collection Time: 07/16/17  9:04 AM  Result Value Ref Range   Microalb, Ur <0.7 0.0 - 1.9 mg/dL   Creatinine,U 162.7 mg/dL   Microalb Creat Ratio 0.4 0.0 - 30.0 mg/g   Follow up: Return in about 3 months (around  10/13/2017) for DM.  Wilfred Lacy, NP

## 2017-07-29 ENCOUNTER — Encounter: Payer: Self-pay | Admitting: Nurse Practitioner

## 2017-07-29 NOTE — Progress Notes (Signed)
Abstracted result and sent to scan  

## 2017-09-05 ENCOUNTER — Other Ambulatory Visit: Payer: Self-pay | Admitting: Nurse Practitioner

## 2017-10-23 ENCOUNTER — Encounter: Payer: Self-pay | Admitting: Nurse Practitioner

## 2017-10-23 ENCOUNTER — Ambulatory Visit: Payer: 59 | Admitting: Nurse Practitioner

## 2017-10-23 VITALS — BP 122/90 | HR 82 | Temp 98.1°F | Ht 64.0 in | Wt 151.8 lb

## 2017-10-23 DIAGNOSIS — E1165 Type 2 diabetes mellitus with hyperglycemia: Secondary | ICD-10-CM

## 2017-10-23 DIAGNOSIS — N401 Enlarged prostate with lower urinary tract symptoms: Secondary | ICD-10-CM | POA: Diagnosis not present

## 2017-10-23 DIAGNOSIS — Z1211 Encounter for screening for malignant neoplasm of colon: Secondary | ICD-10-CM | POA: Diagnosis not present

## 2017-10-23 DIAGNOSIS — R3914 Feeling of incomplete bladder emptying: Secondary | ICD-10-CM

## 2017-10-23 DIAGNOSIS — R195 Other fecal abnormalities: Secondary | ICD-10-CM

## 2017-10-23 LAB — IFOBT (OCCULT BLOOD): IMMUNOLOGICAL FECAL OCCULT BLOOD TEST: POSITIVE

## 2017-10-23 MED ORDER — TAMSULOSIN HCL 0.4 MG PO CAPS: 0.4000 mg | ORAL_CAPSULE | Freq: Every day | ORAL | 1 refills | Status: DC

## 2017-10-23 MED ORDER — METFORMIN HCL 500 MG PO TABS: 500.0000 mg | ORAL_TABLET | Freq: Two times a day (BID) | ORAL | 5 refills | Status: DC

## 2017-10-23 MED ORDER — GLIPIZIDE ER 5 MG PO TB24: 5.0000 mg | ORAL_TABLET | Freq: Every day | ORAL | 5 refills | Status: DC

## 2017-10-23 NOTE — Patient Instructions (Addendum)
Resume diabetes medications  normal PSA and urinalysis.  Need records from urology   Diabetes mellitus y nutricin Diabetes Mellitus and Nutrition Si sufre de diabetes (diabetes mellitus), es muy importante tener hbitos alimenticios saludables debido a que sus niveles de Psychologist, counselling sangre (glucosa) se ven afectados en gran medida por lo que come y bebe. [place] alimentos saludables en las cantidades [place], aproximadamente a la [employer], [place] ayudar a:  Scientist, physiological glucemia.  Disminuir el riesgo de sufrir una enfermedad cardaca.  Mejorar la presin arterial.  Barista o mantener un peso saludable.  Todas las personas que sufren de diabetes son diferentes y cada una tiene necesidades diferentes en cuanto a un plan de alimentacin. El mdico puede recomendarle que trabaje con un especialista en dietas y nutricin (nutricionista) para Tax adviser plan para usted. Su plan de alimentacin puede variar segn factores como:  Las caloras que necesita.  Los medicamentos que toma.  Su peso.  Sus niveles de glucemia, presin arterial y colesterol.  Su nivel de [place].  Otras afecciones que tenga, como enfermedades cardacas o renales.  Cmo me afectan los carbohidratos? Los carbohidratos afectan el nivel de glucemia ms que cualquier otro tipo de alimento. La ingesta de carbohidratos naturalmente aumenta la cantidad glucosa en la sangre. El recuento de carbohidratos es un mtodo destinado a Midwife un registro de la cantidad de carbohidratos que se ingieren. El recuento de carbohidratos es importante para Pharmacologist la glucemia a un nivel saludable, en especial si utiliza insulina o toma determinados medicamentos por va oral para la diabetes. Es importante saber la cantidad de carbohidratos que se pueden ingerir en cada comida sin correr Surveyor, minerals. Esto es Government social research officer. El nutricionista puede ayudarlo a calcular la cantidad de carbohidratos que  debe ingerir en cada comida y colacin. Los alimentos que contienen carbohidratos incluyen:  Pan, cereal, arroz, pasta y galletas.  Papas y maz.  Guisantes, frijoles y lentejas.  Leche y Dentist.  Nils Pyle y [place].  Postres, como pasteles, galletitas, helado y caramelos.  Cmo me afecta el alcohol? El alcohol puede provocar disminuciones sbitas de la glucemia (hipoglucemia), en especial si utiliza insulina o toma determinados medicamentos por va oral para la diabetes. La hipoglucemia es una afeccin potencialmente mortal. Los sntomas de la hipoglucemia (somnolencia, mareos y confusin) son similares a los sntomas de haber consumido demasiado alcohol. Si el mdico afirma que el alcohol es seguro para usted, siga estas pautas:  Limite el consumo de alcohol a no ms de 1 medida por da si es mujer y no est Orthoptist, y a 2 medidas si es hombre. Una medida equivale a 12oz ( ) de cerveza, 5oz ( ) de vino o 1oz (44ml) de bebidas de alta graduacin alcohlica.  No beba con el estmago vaco.  Mantngase hidratado con agua, gaseosas dietticas o t helado sin azcar.  Tenga en cuenta que las gaseosas comunes, los jugos y otros refrescos pueden contener mucha azcar y se deben contar como carbohidratos.  Consejos para seguir Consulting civil engineer las etiquetas de los alimentos  Comience por controlar el tamao de la porcin en la etiqueta. La cantidad de caloras, carbohidratos, grasas y otros nutrientes mencionados en la etiqueta se basan en una porcin del alimento. Muchos alimentos contienen ms de una porcin por envase.  Verifique la cantidad total de gramos (g) de carbohidratos totales en una porcin. Puede calcular la cantidad de porciones de carbohidratos al dividir el total de carbohidratos por 15. Por  ejemplo, si un alimento posee un total de 30g de carbohidratos, equivale a 2 porciones de carbohidratos.  Verifique la cantidad de gramos (g) de grasas saturadas y grasas  trans en una porcin. Escoja alimentos que no contengan grasa o que tengan un bajo contenido.  Controle la cantidad de miligramos (mg) de sodio en una porcin. La [employer] de las personas deben limitar la ingesta de sodio total a menos de  por Futures trader.  Siempre consulte la informacin nutricional de los alimentos etiquetados como "con bajo contenido de grasa" o "sin grasa". Estos alimentos pueden ser ms altos en azcar agregada o en carbohidratos refinados y deben evitarse.  Hable con el nutricionista para identificar sus objetivos diarios en cuanto a los nutrientes mencionados en la etiqueta. De compras  Evite comprar alimentos procesados, enlatados o prehechos. Estos alimentos tienden a Civil Service fast streamer cantidad de [place], sodio y azcar agregada.  Compre en la zona exterior de la tienda de comestibles. Esta incluye frutas y [employer], granos a granel, carnes frescas y productos lcteos frescos. Coccin  Utilice mtodos de coccin a baja temperatura, como hornear, en lugar de mtodos de coccin a alta temperatura, como frer en abundante aceite.  Cocine con aceites saludables, como el aceite de [place], [place]ola o [place].  Evite cocinar con manteca, crema o carnes con alto contenido de grasa. Planificacin de las comidas  [employer] comidas y las colaciones de forma regular, preferentemente a la misma hora todos [place]. Evite pasar largos perodos de tiempo sin comer.  Consuma alimentos ricos en fibra, como frutas frescas, verduras, frijoles y cereales integrales. Consulte al nutricionista sobre cuntas porciones de carbohidratos puede consumir en cada comida.  Consuma entre 4 y 6 onzas de protenas magras por da, como carnes [place], pollo, pescado, [employer] o tofu. 1 onza equivale a 1 onza de carne, pollo o pescado, 1 huevo, o 1/4 taza de tofu.  Coma algunos alimentos por da que contengan grasas saludables, como aguacates, frutos secos, semillas y pescado. Estilo de  vida   Controle su nivel de glucemia con regularidad.  Haga ejercicio al menos , 5das o ms por semana, o como se lo haya indicado el mdico.  Tome los [employer] se lo haya indicado el mdico.  No consuma ningn producto que contenga nicotina o tabaco, como cigarrillos y Administrator, Civil Service. Si necesita ayuda para dejar de fumar, consulte al C[employer] con un asesor o instructor en diabetes para identificar estrategias para controlar el estrs y cualquier desafo emocional y social. Cules son algunas de las preguntas que puedo hacerle a mi mdico?  Es necesario que me rena con IT trainer en diabetes?  Es necesario que me rena con un nutricionista?  A qu nmero puedo llamar si tengo preguntas?  Cules son los mejores momentos para controlar la glucemia? Dnde encontrar ms informacin:  Asociacin Americana de la Diabetes (American Diabetes Association): diabetes.org/food-and-fitness/food  Academia de Nutricin y Pension scheme manager (Academy of Nutrition and Dietetics): https://www.vargas.com/  [employer] de la Diabetes y las Enfermedades Digestivas y Special educational needs teacher [facility] Diabetes and Digestive and Kidney Diseases) (Institutos Nacionales de [place], NIH): FindJewelers.cz Resumen  Un plan de alimentacin saludable lo ayudar a Scientist, physiological glucemia y Pharmacologist un estilo de vida saludable.  Trabajar con un especialista en dietas y nutricin (nutricionista) puede ayudarlo a Designer, television/film set de alimentacin para usted.  Tenga en cuenta que los carbohidratos y el alcohol tienen efectos inmediatos en sus niveles de glucemia. Es importante contar los carbohidratos  y consumir alcohol con prudencia. Esta informacin no tiene Theme park manager el consejo del mdico. Asegrese de hacerle al mdico cualquier pregunta que  tenga. Document Released: 09/04/2007 Document Revised: 09/17/2016 Document Reviewed: 09/17/2016 Elsevier Interactive Patient Education  2018 [employer].

## 2017-10-23 NOTE — Assessment & Plan Note (Signed)
Repeat in 3months Last colonoscopy 06/2017: diverticulosis, no colon polyp FHx of colon and breast cancer

## 2017-10-23 NOTE — Progress Notes (Signed)
Subjective:  Patient ID: Edward Mcgee, male    DOB: 05/17/1956  Age: 62 y.o. MRN: [id number]  CC: Follow-up (DM F/U one of the medications was not provided by the pharmacy. his readings have maxed at 300 with min. of 190 more commonly >250. Has been losing weight.) and Urinary Retention (has prostate checked reg. but its becoming more difficult to urinate.)  Accompanied by wife and spanish interpreter.  Urinary Frequency   This is a chronic problem. The current episode started more than 1 year ago. The problem occurs every urination. The problem has been gradually worsening. The quality of the pain is described as burning. There has been no fever. He is sexually active. There is no history of pyelonephritis. Associated symptoms include frequency, hesitancy and urgency. Pertinent negatives include no chills, discharge, flank pain, hematuria, nausea, sweats or vomiting. His past medical history is significant for urinary stasis.  last seen by urology 60month ago. Reports his exam was normal. Unable to remember medication that was prescribed at the time.  DM: Uncontrolled Did not take medication as prescribed. miscommunication with pharmacy: he reports medication was not at pharmacy, pharmacy states medication was never picked up. Reports glucose levels 190-300. Has made changes to diet (small portions, mostly consist of rice, totilla, meat), very little vegetables added.  Outpatient Medications Prior to Visit  Medication Sig Dispense Refill  . blood glucose meter kit and supplies KIT Dispense based on patient and insurance preference. Use before breakfast and at bedtime. ICD E11.9 1 each 0  . omeprazole (PRILOSEC) 20 MG capsule Take 1 capsule (20 mg total) by mouth daily. 90 capsule 1  . ONE TOUCH ULTRA TEST test strip USE 1 STRIP TO CHECK GLUCOSE BEFORE BREAKFAST AND AT BEDTIME 100 each 2  . ONETOUCH DELICA LANCETS FINE MISC     . sucralfate (CARAFATE) 1 g tablet Take 1 tablet (1 g total)  by mouth 2 (two) times daily between meals as needed. (Patient not taking: Reported on 10/23/2017) 60 tablet 3  . glipizide-metformin (METAGLIP) 2.5-250 MG tablet Take 1 tablet by mouth 2 (two) times daily after a meal. (Patient not taking: Reported on 10/23/2017) 60 tablet 3   Facility-Administered Medications Prior to Visit  Medication Dose Route Frequency Provider Last Rate Last Dose  . 0.9 %  sodium chloride infusion  500 mL Intravenous Continuous JMilus Banister MD        ROS See HPI  Objective:  BP 122/90 (BP Location: Right Arm, Patient Position: Sitting, Cuff Size: Normal)   Pulse 82   Temp 98.1 F (36.7 C) (Oral)   Ht 5' 4" (1.626 m)   Wt 151 lb 12.8 oz (68.9 kg)   SpO2 94%   BMI 26.06 kg/m   BP Readings from Last 3 Encounters:  10/23/17 122/90  07/16/17 140/80  06/18/17 122/76    Wt Readings from Last 3 Encounters:  10/23/17 151 lb 12.8 oz (68.9 kg)  07/16/17 156 lb (70.8 kg)  06/18/17 147 lb (66.7 kg)    Physical Exam  Constitutional: He is oriented to person, place, and time. No distress.  Cardiovascular: Normal rate, regular rhythm, normal heart sounds and intact distal pulses.  Pulmonary/Chest: Effort normal and breath sounds normal.  Abdominal: Soft. Bowel sounds are normal.  Genitourinary: Rectum normal. Prostate is enlarged. Prostate is not tender.  Musculoskeletal: Normal range of motion. He exhibits no edema.  Neurological: He is alert and oriented to person, place, and time.  Vitals reviewed.   Lab  Results  Component Value Date   WBC 5.1 07/16/2017   HGB 13.9 07/16/2017   HCT 41.7 07/16/2017   PLT 277.0 07/16/2017   GLUCOSE 230 (H) 07/16/2017   CHOL 192 07/16/2017   TRIG 96.0 07/16/2017   HDL 66.80 07/16/2017   LDLCALC 106 (H) 07/16/2017   ALT 18 07/16/2017   AST 14 07/16/2017   NA 140 07/16/2017   K 4.1 07/16/2017   CL 102 07/16/2017   CREATININE 0.83 07/16/2017   BUN 17 07/16/2017   CO2 31 07/16/2017   TSH 1.75 07/16/2017   PSA  1.89 10/23/2017   HGBA1C 11.1 (H) 07/16/2017   MICROALBUR <0.7 07/16/2017    Dg Cervical Spine Complete  Result Date: 03/13/2017 CLINICAL DATA:  Right arm pain and numbness EXAM: CERVICAL SPINE - COMPLETE 4+ VIEW COMPARISON:  None. FINDINGS: Cervical alignment within normal limits. Mild degenerative changes at C5-C6 and C6-C7. Suggested foraminal narrowing of the mid cervical spine. Prevertebral soft tissue thickness is normal. Dens and lateral masses are unremarkable. IMPRESSION: No acute osseous abnormality. Mild degenerative changes at C5-C6 and C6-C7 Electronically Signed   By: Donavan Foil M.D.   On: 03/13/2017 22:57    Assessment & Plan:   Dameon was seen today for follow-up and urinary retention.  Diagnoses and all orders for this visit:  Benign prostatic hyperplasia with incomplete bladder emptying -     PSA -     Urinalysis w microscopic + reflex cultur -     tamsulosin (FLOMAX) 0.4 MG CAPS capsule; Take 1 capsule (0.4 mg total) by mouth daily after supper.  Uncontrolled type 2 diabetes mellitus with hyperglycemia (HCC) -     metFORMIN (GLUCOPHAGE) 500 MG tablet; Take 1 tablet (500 mg total) by mouth 2 (two) times daily with a meal. -     glipiZIDE (GLUCOTROL XL) 5 MG 24 hr tablet; Take 1 tablet (5 mg total) by mouth daily with breakfast.  Encounter for screening fecal occult blood testing -     IFOBT POC (occult bld, rslt in office)  Positive fecal occult blood test  Other orders -     REFLEXIVE URINE CULTURE   I have discontinued Demond Attar's glipizide-metformin. I am also having him start on metFORMIN, glipiZIDE, and tamsulosin. Additionally, I am having him maintain his sucralfate, omeprazole, blood glucose meter kit and supplies, ONE TOUCH ULTRA TEST, and ONETOUCH DELICA LANCETS FINE. We will continue to administer sodium chloride.  Meds ordered this encounter  Medications  . metFORMIN (GLUCOPHAGE) 500 MG tablet    Sig: Take 1 tablet (500 mg total) by mouth 2  (two) times daily with a meal.    Dispense:  60 tablet    Refill:  5    Order Specific Question:   Supervising Provider    Answer:   Lucille Passy [[id number]]  . glipiZIDE (GLUCOTROL XL) 5 MG 24 hr tablet    Sig: Take 1 tablet (5 mg total) by mouth daily with breakfast.    Dispense:  30 tablet    Refill:  5    Order Specific Question:   Supervising Provider    Answer:   Lucille Passy [[id number]]  . tamsulosin (FLOMAX) 0.4 MG CAPS capsule    Sig: Take 1 capsule (0.4 mg total) by mouth daily after supper.    Dispense:  30 capsule    Refill:  1    Order Specific Question:   Supervising Provider    Answer:   Lucille Passy [[id number]]  Follow-up: Return in about 2 months (around 12/23/2017) for DM and BPH (repeat BPH).  Wilfred Lacy, NP

## 2017-10-24 LAB — URINALYSIS W MICROSCOPIC + REFLEX CULTURE
BILIRUBIN URINE: NEGATIVE
Bacteria, UA: NONE SEEN /HPF
Hgb urine dipstick: NEGATIVE
Hyaline Cast: NONE SEEN /LPF
LEUKOCYTE ESTERASE: NEGATIVE
NITRITES URINE, INITIAL: NEGATIVE
Protein, ur: NEGATIVE
RBC / HPF: NONE SEEN /HPF (ref 0–2)
SPECIFIC GRAVITY, URINE: 1.019 (ref 1.001–1.03)
SQUAMOUS EPITHELIAL / LPF: NONE SEEN /HPF (ref <=5)
WBC, UA: NONE SEEN /HPF (ref 0–5)
pH: <=5 (ref 5.0–8.0)

## 2017-10-24 LAB — PSA: PSA: 1.89 ng/mL (ref 0.10–4.00)

## 2017-10-24 LAB — NO CULTURE INDICATED

## 2017-10-25 ENCOUNTER — Encounter: Payer: Self-pay | Admitting: Nurse Practitioner

## 2017-12-16 ENCOUNTER — Other Ambulatory Visit: Payer: Self-pay | Admitting: Nurse Practitioner

## 2017-12-16 DIAGNOSIS — R3914 Feeling of incomplete bladder emptying: Principal | ICD-10-CM

## 2017-12-16 DIAGNOSIS — N401 Enlarged prostate with lower urinary tract symptoms: Secondary | ICD-10-CM

## 2017-12-18 ENCOUNTER — Other Ambulatory Visit: Payer: Self-pay | Admitting: Nurse Practitioner

## 2017-12-23 ENCOUNTER — Ambulatory Visit: Payer: 59 | Admitting: Nurse Practitioner

## 2017-12-23 ENCOUNTER — Encounter: Payer: Self-pay | Admitting: Nurse Practitioner

## 2017-12-23 ENCOUNTER — Other Ambulatory Visit (HOSPITAL_COMMUNITY)
Admission: RE | Admit: 2017-12-23 | Discharge: 2017-12-23 | Disposition: A | Discharge status: Home / Self Care | Payer: 59 | Source: Ambulatory Visit | Attending: Nurse Practitioner | Admitting: Nurse Practitioner

## 2017-12-23 VITALS — BP 146/86 | HR 74 | Temp 98.3°F | Wt 156.0 lb

## 2017-12-23 DIAGNOSIS — R21 Rash and other nonspecific skin eruption: Secondary | ICD-10-CM

## 2017-12-23 DIAGNOSIS — E119 Type 2 diabetes mellitus without complications: Secondary | ICD-10-CM | POA: Diagnosis not present

## 2017-12-23 DIAGNOSIS — Z1159 Encounter for screening for other viral diseases: Secondary | ICD-10-CM | POA: Diagnosis not present

## 2017-12-23 DIAGNOSIS — K2100 Gastro-esophageal reflux disease with esophagitis, without bleeding: Secondary | ICD-10-CM

## 2017-12-23 DIAGNOSIS — L859 Epidermal thickening, unspecified: Secondary | ICD-10-CM | POA: Diagnosis not present

## 2017-12-23 DIAGNOSIS — Z23 Encounter for immunization: Secondary | ICD-10-CM

## 2017-12-23 DIAGNOSIS — R3914 Feeling of incomplete bladder emptying: Secondary | ICD-10-CM

## 2017-12-23 DIAGNOSIS — N401 Enlarged prostate with lower urinary tract symptoms: Secondary | ICD-10-CM | POA: Diagnosis not present

## 2017-12-23 DIAGNOSIS — R070 Pain in throat: Secondary | ICD-10-CM

## 2017-12-23 LAB — BASIC METABOLIC PANEL
BUN: 16 mg/dL (ref 6–23)
CALCIUM: 9.6 mg/dL (ref 8.4–10.5)
CO2: 30 meq/L (ref 19–32)
CREATININE: 1 mg/dL (ref 0.40–1.50)
Chloride: 102 mEq/L (ref 96–112)
GFR: 80.13 mL/min (ref >60.00)
GLUCOSE: 80 mg/dL (ref 70–99)
Potassium: 3.7 mEq/L (ref 3.5–5.1)
Sodium: 140 mEq/L (ref 135–145)

## 2017-12-23 LAB — HEMOGLOBIN A1C: Hgb A1c MFr Bld: 6.9 % — ABNORMAL HIGH (ref 4.6–6.5)

## 2017-12-23 MED ORDER — KETOCONAZOLE 2 % EX CREA
1.0000 | TOPICAL_CREAM | Freq: Every day | CUTANEOUS | 0 refills | Status: DC
Start: 2017-12-23 — End: 2017-12-25

## 2017-12-23 MED ORDER — TAMSULOSIN HCL 0.4 MG PO CAPS: ORAL_CAPSULE | ORAL | 3 refills | Status: DC

## 2017-12-23 MED ORDER — GLUCOSE BLOOD VI STRP: ORAL_STRIP | 3 refills | Status: DC

## 2017-12-23 MED ORDER — CLOTRIMAZOLE-BETAMETHASONE 1-0.05 % EX CREA: 1.0000 "application " | TOPICAL_CREAM | Freq: Two times a day (BID) | CUTANEOUS | 0 refills | Status: DC

## 2017-12-23 NOTE — Progress Notes (Addendum)
Subjective:  Patient ID: Edward Mcgee, male    DOB: January 18, 1956  Age: 62 y.o. MRN: [id number]  CC: Follow-up (2 mo fu/ pt report blood glucose is runing no more than 140 checking twice daily. pt request refill for test restrips. ) and Rash (rash on lower abd, this has been going on for a while, medication doesnt help from dermatologist. )   accompanied by [place] interpreter and wife.  Rash  This is a chronic problem. The current episode started more than 1 year ago (onset over 68yr ago). The problem has been gradually worsening since onset. The affected locations include the groin (right groin). The rash is characterized by itchiness, redness and scaling. It is unknown if there was an exposure to a precipitant. Pertinent negatives include no anorexia, fatigue, fever, joint pain or nail changes. Past treatments include anti-itch cream and topical steroids (antifungal cream and powder). The treatment provided no relief. There is no history of allergies, eczema or varicella.  reports groin lesion was biopsied by dermatologist in [place] no abnormal finding per patient and wife. Hx of onychomycosis several years ago, resolved with use of Lamisil.  DM: Denies any hypoglycemia or GI side effects with glucotrol and metformin. AM: 81-147 PM: 118-149.  BPH:  improved urinary symptoms with use of flomax. Normal PSA.   reviewed [facility] today.  Outpatient Medications Prior to Visit  Medication Sig Dispense Refill  . blood glucose meter kit and supplies KIT Dispense based on patient and insurance preference. Use before breakfast and at bedtime. ICD E11.9 1 each 0  . ONETOUCH DELICA LANCETS FINE MISC     . glipiZIDE (GLUCOTROL XL) 5 MG 24 hr tablet Take 1 tablet (5 mg total) by mouth daily with breakfast. 30 tablet 5  . metFORMIN (GLUCOPHAGE) 500 MG tablet Take 1 tablet (500 mg total) by mouth 2 (two) times daily with a meal. 60 tablet 5  . omeprazole (PRILOSEC) 20 MG capsule Take 1 capsule (20  mg total) by mouth daily. 90 capsule 1  . ONE TOUCH ULTRA TEST test strip USE 1 STRIP TO CHECK GLUCOSE BEFORE BREAKFAST AND AT BEDTIME 100 each 0  . tamsulosin (FLOMAX) 0.4 MG CAPS capsule TAKE 1 CAPSULE BY MOUTH ONCE DAILY AFTER  SUPPER 30 capsule 1  . sucralfate (CARAFATE) 1 g tablet Take 1 tablet (1 g total) by mouth 2 (two) times daily between meals as needed. (Patient not taking: Reported on 10/23/2017) 60 tablet 3   Facility-Administered Medications Prior to Visit  Medication Dose Route Frequency Provider Last Rate Last Dose  . 0.9 %  sodium chloride infusion  500 mL Intravenous Continuous JMilus Banister MD        ROS See HPI  Objective:  BP (!) 146/86   Pulse 74   Temp 98.3 F (36.8 C) (Oral)   Wt 156 lb (70.8 kg)   SpO2 98%   BMI 26.78 kg/m   BP Readings from Last 3 Encounters:  12/23/17 (!) 146/86  10/23/17 122/90  07/16/17 140/80    Wt Readings from Last 3 Encounters:  12/23/17 156 lb (70.8 kg)  10/23/17 151 lb 12.8 oz (68.9 kg)  07/16/17 156 lb (70.8 kg)    Physical Exam  Constitutional: He is oriented to person, place, and time. No distress.  Cardiovascular: Normal rate.  Pulmonary/Chest: Effort normal.  Musculoskeletal: He exhibits no edema.  Neurological: He is alert and oriented to person, place, and time.  Skin: Skin is warm and dry. Lesion and rash  noted. No cyanosis or erythema. Nails show no clubbing.     Normal toenails.  Vitals reviewed.   Lab Results  Component Value Date   WBC 5.1 07/16/2017   HGB 13.9 07/16/2017   HCT 41.7 07/16/2017   PLT 277.0 07/16/2017   GLUCOSE 80 12/23/2017   CHOL 192 07/16/2017   TRIG 96.0 07/16/2017   HDL 66.80 07/16/2017   LDLCALC 106 (H) 07/16/2017   ALT 18 07/16/2017   AST 14 07/16/2017   NA 140 12/23/2017   K 3.7 12/23/2017   CL 102 12/23/2017   CREATININE 1.00 12/23/2017   BUN 16 12/23/2017   CO2 30 12/23/2017   TSH 1.75 07/16/2017   PSA 1.89 10/23/2017   HGBA1C 6.9 (H) 12/23/2017    MICROALBUR <0.7 07/16/2017    Dg Cervical Spine Complete  Result Date: 03/13/2017 CLINICAL DATA:  Right arm pain and numbness EXAM: CERVICAL SPINE - COMPLETE 4+ VIEW COMPARISON:  None. FINDINGS: Cervical alignment within normal limits. Mild degenerative changes at C5-C6 and C6-C7. Suggested foraminal narrowing of the mid cervical spine. Prevertebral soft tissue thickness is normal. Dens and lateral masses are unremarkable. IMPRESSION: No acute osseous abnormality. Mild degenerative changes at C5-C6 and C6-C7 Electronically Signed   By: Donavan Foil M.D.   On: 03/13/2017 22:57   Shave Biopsy:  Procedure including risks/benefits explained to patient.   Questions were answered.  After verbal consent was obtained and a time out completed. Site was cleansed with betadine and then alcohol. Superficial shave biopsy was performed. No cauterization needed.  Pt tolerated procedure well.  Specimen sent for pathology review.   Patient instructed to keep the area dry for 24 hours and to contact us if he develops redness, drainage or swelling at the site.  Pt may use tylenol as needed for discomfort today.   Assessment & Plan:   Edward Mcgee was seen today for follow-up and rash.  Diagnoses and all orders for this visit:  Type 2 diabetes mellitus without complication, without long-term current use of insulin (HCC) -     Basic metabolic panel -     Hemoglobin A1c -     glucose blood (ONE TOUCH ULTRA TEST) test strip; USE 1 STRIP TO CHECK GLUCOSE BEFORE BREAKFAST AND AT BEDTIME -     glipiZIDE (GLUCOTROL XL) 5 MG 24 hr tablet; Take 1 tablet (5 mg total) by mouth daily with breakfast. -     metFORMIN (GLUCOPHAGE) 500 MG tablet; Take 1 tablet (500 mg total) by mouth 2 (two) times daily with a meal.  Benign prostatic hyperplasia with incomplete bladder emptying -     tamsulosin (FLOMAX) 0.4 MG CAPS capsule; TAKE 1 CAPSULE BY MOUTH ONCE DAILY AFTER  SUPPER  Groin rash -     Discontinue:  clotrimazole-betamethasone (LOTRISONE) cream; Apply 1 application topically 2 (two) times daily. -     Dermatology pathology -     Discontinue: ketoconazole (NIZORAL) 2 % cream; Apply 1 application topically daily. -     pimecrolimus (ELIDEL) 1 % cream; Apply topically 2 (two) times daily.  Encounter for hepatitis C screening test for low risk patient -     Hepatitis C Antibody  Need for pneumococcal vaccination -     Cancel: Pneumococcal polysaccharide vaccine 23-valent greater than or equal to 2yo subcutaneous/IM -     Pneumococcal conjugate vaccine 13-valent IM  Gastroesophageal reflux disease with esophagitis -     omeprazole (PRILOSEC) 20 MG capsule; Take 1 capsule (20 mg total) by  mouth daily.  Throat pain -     omeprazole (PRILOSEC) 20 MG capsule; Take 1 capsule (20 mg total) by mouth daily.   I have discontinued Edward Mcgee's sucralfate, clotrimazole-betamethasone, and ketoconazole. I have also changed his ONE TOUCH ULTRA TEST to glucose blood. Additionally, I am having him start on pimecrolimus. Lastly, I am having him maintain his blood glucose meter kit and supplies, ONETOUCH DELICA LANCETS FINE, tamsulosin, glipiZIDE, metFORMIN, and omeprazole. We will continue to administer sodium chloride.  Meds ordered this encounter  Medications  . DISCONTD: clotrimazole-betamethasone (LOTRISONE) cream    Sig: Apply 1 application topically 2 (two) times daily.    Dispense:  30 g    Refill:  0    Order Specific Question:   Supervising Provider    Answer:   MATTHEWS, CODY [[id number]]  . tamsulosin (FLOMAX) 0.4 MG CAPS capsule    Sig: TAKE 1 CAPSULE BY MOUTH ONCE DAILY AFTER  SUPPER    Dispense:  90 capsule    Refill:  3    Please consider 90 day supplies to promote better adherence    Order Specific Question:   Supervising Provider    Answer:   MATTHEWS, CODY [[id number]]  . glucose blood (ONE TOUCH ULTRA TEST) test strip    Sig: USE 1 STRIP TO CHECK GLUCOSE BEFORE BREAKFAST AND AT BEDTIME      Dispense:  100 each    Refill:  3    Please consider 90 day supplies to promote better adherence    Order Specific Question:   Supervising Provider    Answer:   MATTHEWS, CODY [[id number]]  . DISCONTD: ketoconazole (NIZORAL) 2 % cream    Sig: Apply 1 application topically daily.    Dispense:  15 g    Refill:  0    Order Specific Question:   Supervising Provider    Answer:   MATTHEWS, CODY [[id number]]  . glipiZIDE (GLUCOTROL XL) 5 MG 24 hr tablet    Sig: Take 1 tablet (5 mg total) by mouth daily with breakfast.    Dispense:  90 tablet    Refill:  3    Order Specific Question:   Supervising Provider    Answer:   MATTHEWS, CODY [[id number]]  . metFORMIN (GLUCOPHAGE) 500 MG tablet    Sig: Take 1 tablet (500 mg total) by mouth 2 (two) times daily with a meal.    Dispense:  180 tablet    Refill:  3    Order Specific Question:   Supervising Provider    Answer:   MATTHEWS, CODY [[id number]]  . omeprazole (PRILOSEC) 20 MG capsule    Sig: Take 1 capsule (20 mg total) by mouth daily.    Dispense:  90 capsule    Refill:  3    Order Specific Question:   Supervising Provider    Answer:   MATTHEWS, CODY [[id number]]  . pimecrolimus (ELIDEL) 1 % cream    Sig: Apply topically 2 (two) times daily.    Dispense:  30 g    Refill:  0    Order Specific Question:   Supervising Provider    Answer:   Lucille Passy [[id number]]   Follow-up: Return in about 6 months (around 06/25/2018) for DM and hyperlipidemia (fasting).  Wilfred Lacy, NP

## 2017-12-23 NOTE — Patient Instructions (Addendum)
Significant improvement in hgbA1c 11 to 6.9. Maintain current doses of metformin and glipizide. Normal BMP. Pending hep C  Shave biopsy is negative for fungal infection. Stop antifungal cream. Shows thickening of skin cells. try elidel cream x 3weeks. If no improvement, will need to see dermatology

## 2017-12-24 DIAGNOSIS — K210 Gastro-esophageal reflux disease with esophagitis: Secondary | ICD-10-CM

## 2017-12-24 DIAGNOSIS — R21 Rash and other nonspecific skin eruption: Secondary | ICD-10-CM | POA: Insufficient documentation

## 2017-12-24 DIAGNOSIS — K219 Gastro-esophageal reflux disease without esophagitis: Secondary | ICD-10-CM | POA: Insufficient documentation

## 2017-12-24 LAB — HEPATITIS C ANTIBODY
HEP C AB: NONREACTIVE
SIGNAL TO CUT-OFF: 0.02 (ref <1.00)

## 2017-12-24 MED ORDER — GLIPIZIDE ER 5 MG PO TB24: 5.0000 mg | ORAL_TABLET | Freq: Every day | ORAL | 3 refills | Status: DC

## 2017-12-24 MED ORDER — METFORMIN HCL 500 MG PO TABS: 500.0000 mg | ORAL_TABLET | Freq: Two times a day (BID) | ORAL | 3 refills | Status: DC

## 2017-12-24 MED ORDER — OMEPRAZOLE 20 MG PO CPDR: 20.0000 mg | DELAYED_RELEASE_CAPSULE | Freq: Every day | ORAL | 3 refills | Status: DC

## 2017-12-24 NOTE — Assessment & Plan Note (Addendum)
Onset 5652yrs ago Biopsied in past by dermatology in [place]. He does not remember results nor name of dermatologist. No improvement to topical antifungal or corticosteroid. Shave biopsy sent.  Shave biopsy is negative for fungal infection. Stop antifungal cream. Shows thickening of skin cells. try elidel cream x 3weeks. If no improvement, will need to see dermatology

## 2017-12-24 NOTE — Assessment & Plan Note (Signed)
Improved with metformin and glipizide. HgbA1c 11 to 6.9 in 6months. normal foot exam. Will schedule eye and dental exam.

## 2017-12-24 NOTE — Addendum Note (Signed)
Addended by: Alysia PennaNCHE, Paeton Latouche L on: 12/24/2017 01:00 PM   Modules accepted: Orders

## 2017-12-24 NOTE — Assessment & Plan Note (Signed)
Improved with omeprazole and diet changes

## 2017-12-25 ENCOUNTER — Telehealth: Payer: Self-pay | Admitting: Nurse Practitioner

## 2017-12-25 MED ORDER — PIMECROLIMUS 1 % EX CREA
TOPICAL_CREAM | Freq: Two times a day (BID) | CUTANEOUS | 0 refills | Status: DC
Start: 2017-12-25 — End: 2018-06-25

## 2017-12-25 NOTE — Addendum Note (Signed)
Addended by: Michaela CornerNCHE, Vesper Trant L on: 12/25/2017 02:43 PM   Modules accepted: Orders

## 2017-12-25 NOTE — Telephone Encounter (Signed)
Please advise    Copied from CRM [id number]. Topic: General - Other >> Dec 25, 2017  4:46 PM Gean BirchwoodWilliams-Neal, Sade R wrote: Pt called in and stated the insurance isnt covering pimecrolimus (ELIDEL) 1 % cream can something generic be sent in to cover ?

## 2017-12-25 NOTE — Telephone Encounter (Signed)
Ok to start PA

## 2017-12-26 NOTE — Telephone Encounter (Signed)
PA approved. Pharmacy notified 

## 2017-12-26 NOTE — Telephone Encounter (Signed)
PA for Elidel started. Waiting for result.   Ember Early (Key: [id number])

## 2018-02-17 ENCOUNTER — Other Ambulatory Visit: Payer: Self-pay | Admitting: Nurse Practitioner

## 2018-06-25 ENCOUNTER — Encounter: Payer: Self-pay | Admitting: Nurse Practitioner

## 2018-06-25 ENCOUNTER — Ambulatory Visit: Payer: BLUE CROSS/BLUE SHIELD | Admitting: Nurse Practitioner

## 2018-06-25 VITALS — BP 130/86 | HR 73 | Temp 98.5°F | Ht 64.0 in | Wt 158.4 lb

## 2018-06-25 DIAGNOSIS — Z23 Encounter for immunization: Secondary | ICD-10-CM | POA: Diagnosis not present

## 2018-06-25 DIAGNOSIS — R21 Rash and other nonspecific skin eruption: Secondary | ICD-10-CM

## 2018-06-25 DIAGNOSIS — E119 Type 2 diabetes mellitus without complications: Secondary | ICD-10-CM | POA: Diagnosis not present

## 2018-06-25 LAB — POCT GLYCOSYLATED HEMOGLOBIN (HGB A1C): Hemoglobin A1C: 5.9 % — AB (ref 4.0–5.6)

## 2018-06-25 MED ORDER — PIMECROLIMUS 1 % EX CREA: TOPICAL_CREAM | Freq: Two times a day (BID) | CUTANEOUS | 1 refills | Status: DC

## 2018-06-25 NOTE — Progress Notes (Signed)
Subjective:  Patient ID: Edward Mcgee, male    DOB: 1956-01-13  Age: 63 y.o. MRN: [id number]  CC: Follow-up (f/u 6 mo -- no eye exam )  HPI  DM: Report home glucose 100-130 Use of glipizide and metformin. Needs referral to opthamology. No neuropathy, no change in vision, no nausea or diarrhea. Maintains low carb and low fat diet.  Reviewed past Medical, Social and Family history today.  Outpatient Medications Prior to Visit  Medication Sig Dispense Refill  . blood glucose meter kit and supplies KIT Dispense based on patient and insurance preference. Use before breakfast and at bedtime. ICD E11.9 1 each 0  . metFORMIN (GLUCOPHAGE) 500 MG tablet Take 1 tablet (500 mg total) by mouth 2 (two) times daily with a meal. 180 tablet 3  . omeprazole (PRILOSEC) 20 MG capsule Take 1 capsule (20 mg total) by mouth daily. 90 capsule 3  . ONE TOUCH ULTRA TEST test strip USE TO CHECK GLUCOSE BEFORE BREAKFAST AND AT BEDTIME 100 each 3  . ONETOUCH DELICA LANCETS FINE MISC     . tamsulosin (FLOMAX) 0.4 MG CAPS capsule TAKE 1 CAPSULE BY MOUTH ONCE DAILY AFTER  SUPPER 90 capsule 3  . glipiZIDE (GLUCOTROL XL) 5 MG 24 hr tablet Take 1 tablet (5 mg total) by mouth daily with breakfast. 90 tablet 3  . pimecrolimus (ELIDEL) 1 % cream Apply topically 2 (two) times daily. 30 g 0   Facility-Administered Medications Prior to Visit  Medication Dose Route Frequency Provider Last Rate Last Dose  . 0.9 %  sodium chloride infusion  500 mL Intravenous Continuous Milus Banister, MD        ROS See HPI  Objective:  BP 130/86   Pulse 73   Temp 98.5 F (36.9 C) (Oral)   Ht '5\' 4"'  (1.626 m)   Wt 158 lb 6.4 oz (71.8 kg)   SpO2 96%   BMI 27.19 kg/m   BP Readings from Last 3 Encounters:  06/25/18 130/86  12/23/17 (!) 146/86  10/23/17 122/90    Wt Readings from Last 3 Encounters:  06/25/18 158 lb 6.4 oz (71.8 kg)  12/23/17 156 lb (70.8 kg)  10/23/17 151 lb 12.8 oz (68.9 kg)    Physical Exam Vitals  signs reviewed.  Cardiovascular:     Rate and Rhythm: Normal rate and regular rhythm.     Pulses: Normal pulses.     Heart sounds: Normal heart sounds.  Pulmonary:     Effort: Pulmonary effort is normal.     Breath sounds: Normal breath sounds.  Abdominal:     General: Bowel sounds are normal.     Palpations: Abdomen is soft.  Skin:    General: Skin is warm and dry.     Comments: Completed diabetic foot exam: normal  Neurological:     Mental Status: He is alert and oriented to person, place, and time.     Lab Results  Component Value Date   WBC 5.1 07/16/2017   HGB 13.9 07/16/2017   HCT 41.7 07/16/2017   PLT 277.0 07/16/2017   GLUCOSE 80 12/23/2017   CHOL 192 07/16/2017   TRIG 96.0 07/16/2017   HDL 66.80 07/16/2017   LDLCALC 106 (H) 07/16/2017   ALT 18 07/16/2017   AST 14 07/16/2017   NA 140 12/23/2017   K 3.7 12/23/2017   CL 102 12/23/2017   CREATININE 1.00 12/23/2017   BUN 16 12/23/2017   CO2 30 12/23/2017   TSH 1.75 07/16/2017  PSA 1.89 10/23/2017   HGBA1C 5.9 (A) 06/25/2018   MICROALBUR <0.7 07/16/2017    Assessment & Plan:   Edward Mcgee was seen today for follow-up.  Diagnoses and all orders for this visit:  Type 2 diabetes mellitus without complication, without long-term current use of insulin (HCC) -     POCT glycosylated hemoglobin (Hb A1C) -     Ambulatory referral to Ophthalmology  Groin rash -     pimecrolimus (ELIDEL) 1 % cream; Apply topically 2 (two) times daily.  Need for influenza vaccination -     Flu vaccine HIGH DOSE PF (Fluzone High dose)   I have discontinued Edward Mcgee's glipiZIDE. I am also having him maintain his blood glucose meter kit and supplies, ONETOUCH DELICA LANCETS FINE, tamsulosin, metFORMIN, omeprazole, ONE TOUCH ULTRA TEST, and pimecrolimus. We will continue to administer sodium chloride.  Meds ordered this encounter  Medications  . pimecrolimus (ELIDEL) 1 % cream    Sig: Apply topically 2 (two) times daily.     Dispense:  30 g    Refill:  1    Order Specific Question:   Supervising Provider    Answer:   MATTHEWS, CODY [[id number]]    Problem List Items Addressed This Visit      Endocrine   DM (diabetes mellitus) ([place]) - Primary   Relevant Orders   POCT glycosylated hemoglobin (Hb A1C) (Completed)   Ambulatory referral to Ophthalmology     Musculoskeletal and Integument   Groin rash   Relevant Medications   pimecrolimus (ELIDEL) 1 % cream    Other Visit Diagnoses    Need for influenza vaccination       Relevant Orders   Flu vaccine HIGH DOSE PF (Fluzone High dose) (Completed)       Follow-up: Return in about 3 months (around 09/24/2018) for CPE (fasting).  Wilfred Lacy, NP

## 2018-06-25 NOTE — Patient Instructions (Signed)
HgbA1c is 5.9. Stop glipizide Continue metformin.

## 2018-06-26 ENCOUNTER — Encounter: Payer: Self-pay | Admitting: Nurse Practitioner

## 2018-07-01 ENCOUNTER — Telehealth: Payer: Self-pay | Admitting: Nurse Practitioner

## 2018-07-01 NOTE — Telephone Encounter (Signed)
PA for Pimecrolimus cream 1% cream started, waiting for result.   Gilford Rile Key: [id number] - Rx #: [id number]

## 2018-07-03 NOTE — Telephone Encounter (Signed)
PA approved but the copay is $100. Pt spouse (on [place]) is aware and will transfer this message to the pt. Pharmacy has to order this med and it will be ready tomorrow.

## 2018-07-08 ENCOUNTER — Telehealth: Payer: Self-pay | Admitting: Nurse Practitioner

## 2018-07-08 NOTE — Telephone Encounter (Signed)
Received message from [facility] stating copy for ontouche ultra test strips copay will be $25 now.   Pt is aware, advise the pt to call insurance company to see what brand they will cover. Pt verbalized understand.

## 2018-07-10 ENCOUNTER — Telehealth: Payer: Self-pay | Admitting: Nurse Practitioner

## 2018-07-10 NOTE — Telephone Encounter (Signed)
PA for OneTouch Ultra Blue Strips stared. Waiting on result.   Edward Mcgee (Key: [id number]: [id number]) - [id number]

## 2018-07-15 NOTE — Telephone Encounter (Signed)
PA approved pt aware and picked up from his pharmacy.

## 2018-08-20 ENCOUNTER — Encounter: Payer: Self-pay | Admitting: Nurse Practitioner

## 2018-08-20 ENCOUNTER — Other Ambulatory Visit: Payer: Self-pay

## 2018-08-20 ENCOUNTER — Ambulatory Visit: Payer: BLUE CROSS/BLUE SHIELD | Admitting: Nurse Practitioner

## 2018-08-20 VITALS — BP 136/75 | HR 75 | Temp 98.6°F | Ht 64.0 in | Wt 158.6 lb

## 2018-08-20 DIAGNOSIS — M79604 Pain in right leg: Secondary | ICD-10-CM | POA: Diagnosis not present

## 2018-08-20 DIAGNOSIS — M5417 Radiculopathy, lumbosacral region: Secondary | ICD-10-CM | POA: Diagnosis not present

## 2018-08-20 DIAGNOSIS — M5441 Lumbago with sciatica, right side: Secondary | ICD-10-CM | POA: Diagnosis not present

## 2018-08-20 DIAGNOSIS — G8929 Other chronic pain: Secondary | ICD-10-CM

## 2018-08-20 DIAGNOSIS — M62559 Muscle wasting and atrophy, not elsewhere classified, unspecified thigh: Secondary | ICD-10-CM | POA: Diagnosis not present

## 2018-08-20 MED ORDER — CYCLOBENZAPRINE HCL 5 MG PO TABS: 5.0000 mg | ORAL_TABLET | Freq: Every day | ORAL | 0 refills | Status: DC

## 2018-08-20 MED ORDER — PREDNISONE 10 MG (21) PO TBPK: ORAL_TABLET | ORAL | 0 refills | Status: DC

## 2018-08-20 NOTE — Patient Instructions (Addendum)
Start oral prednisone in AM with food and flexeril at bedtime.  You will be contacted to schedule appt for lumbar spine MRI.  Monitor glucose closely for possible elevation due to prednisone use. Cal office if glucose >200 for more than 3days

## 2018-08-20 NOTE — Progress Notes (Signed)
Subjective:  Patient ID: Edward Mcgee, male    DOB: [birth year]  Age: 63 y.o. MRN: [id number]  CC: Leg Pain (ongoing for several years, worse in last 3weeks, causing loss of sleep)  accompanied by wife.  Leg Pain   Incident onset: ongoing for more than 16yr. no related to an particular injury. There was no injury mechanism. The pain is present in the right leg, right knee, right thigh and right hip. The quality of the pain is described as cramping and aching. The pain is at a severity of 5/10. The pain has been intermittent since onset. Pertinent negatives include no inability to bear weight, loss of motion, loss of sensation, muscle weakness, numbness or tingling. The symptoms are aggravated by movement (climbing ladder at work and laying down at night). He has tried acetaminophen, NSAIDs, heat, ice and rest for the symptoms. The treatment provided mild relief.  reports he had soccer injury in his 225s(over extension of right hip), but denies any fracture as a result per patient and wife. Describes pain extends from lateral thigh to mid lower leg, denies any swelling associated with pain. Also reports chronic back pain not related to any particular injury.  Works as custodian: job entails repetitive lifting, pushing, climbing and bending.  X-ray of tibia/fibula was done 2008: normal X-ray lumbar spine done 2008: DDD at L4-5 with disc space narrowing.  Reviewed past Medical, Social and Family history today.  Outpatient Medications Prior to Visit  Medication Sig Dispense Refill  . blood glucose meter kit and supplies KIT Dispense based on patient and insurance preference. Use before breakfast and at bedtime. ICD E11.9 1 each 0  . metFORMIN (GLUCOPHAGE) 500 MG tablet Take 1 tablet (500 mg total) by mouth 2 (two) times daily with a meal. 180 tablet 3  . omeprazole (PRILOSEC) 20 MG capsule Take 1 capsule (20 mg total) by mouth daily. 90 capsule 3  . ONE TOUCH ULTRA TEST test strip USE TO  CHECK GLUCOSE BEFORE BREAKFAST AND AT BEDTIME 100 each 3  . ONETOUCH DELICA LANCETS FINE MISC     . tamsulosin (FLOMAX) 0.4 MG CAPS capsule TAKE 1 CAPSULE BY MOUTH ONCE DAILY AFTER  SUPPER 90 capsule 3  . pimecrolimus (ELIDEL) 1 % cream Apply topically 2 (two) times daily. (Patient not taking: Reported on 08/20/2018) 30 g 1   Facility-Administered Medications Prior to Visit  Medication Dose Route Frequency Provider Last Rate Last Dose  . 0.9 %  sodium chloride infusion  500 mL Intravenous Continuous JMilus Banister MD        ROS See HPI  Objective:  BP 136/75   Pulse 75   Temp 98.6 F (37 C) (Oral)   Ht _0  (1.626 m)   Wt 158 lb 9.6 oz (71.9 kg)   SpO2 96%   BMI 27.22 kg/m   BP Readings from Last 3 Encounters:  08/20/18 136/75  06/25/18 130/86  12/23/17 (!) 146/86    Wt Readings from Last 3 Encounters:  08/20/18 158 lb 9.6 oz (71.9 kg)  06/25/18 158 lb 6.4 oz (71.8 kg)  12/23/17 156 lb (70.8 kg)    Physical Exam Vitals signs reviewed.  Cardiovascular:     Rate and Rhythm: Normal rate.     Pulses: Normal pulses.  Pulmonary:     Effort: Pulmonary effort is normal.  Musculoskeletal: Normal range of motion.        General: Tenderness present. No swelling.     Right hip: He  exhibits tenderness. He exhibits normal range of motion, normal strength, no bony tenderness, no swelling, no crepitus, no deformity and no laceration.     Left hip: Normal.     Right knee: Normal. No tenderness found.     Right ankle: Normal.     Lumbar back: Normal.     Right upper leg: He exhibits no tenderness, no bony tenderness, no swelling, no edema and no deformity.     Right lower leg: He exhibits tenderness. He exhibits no bony tenderness and no swelling. No edema.     Left lower leg: No edema.     Right foot: Normal.     Comments: Lateral prominence of right fibula head. Normal and equal prepatellar reflex. Less defined muscle mass and tone in right quadricep muscle compared to  left. No gluteal muscle wasting noted Negative straight leg raise. Lateral leg pain with hip flexion, internal and external rotation.  Skin:    Findings: No erythema or rash.  Neurological:     Mental Status: He is alert and oriented to person, place, and time.     Gait: Gait normal.     Lab Results  Component Value Date   WBC 5.1 07/16/2017   HGB 13.9 07/16/2017   HCT 41.7 07/16/2017   PLT 277.0 07/16/2017   GLUCOSE 80 12/23/2017   CHOL 192 07/16/2017   TRIG 96.0 07/16/2017   HDL 66.80 07/16/2017   LDLCALC 106 (H) 07/16/2017   ALT 18 07/16/2017   AST 14 07/16/2017   NA 140 12/23/2017   K 3.7 12/23/2017   CL 102 12/23/2017   CREATININE 1.00 12/23/2017   BUN 16 12/23/2017   CO2 30 12/23/2017   TSH 1.75 07/16/2017   PSA 1.89 10/23/2017   HGBA1C 5.9 (A) 06/25/2018   MICROALBUR <0.7 07/16/2017    Assessment & Plan:   Bane was seen today for leg pain.  Diagnoses and all orders for this visit:  Leg pain, lateral, right -     cyclobenzaprine (FLEXERIL) 5 MG tablet; Take 1 tablet (5 mg total) by mouth at bedtime.  Lumbosacral radiculopathy -     MR Lumbar Spine Wo Contrast; Future -     predniSONE (STERAPRED UNI-PAK 21 TAB) 10 MG (21) TBPK tablet; As directed on package -     cyclobenzaprine (FLEXERIL) 5 MG tablet; Take 1 tablet (5 mg total) by mouth at bedtime.  Atrophy of quadriceps femoris muscle -     MR Lumbar Spine Wo Contrast; Future -     predniSONE (STERAPRED UNI-PAK 21 TAB) 10 MG (21) TBPK tablet; As directed on package -     cyclobenzaprine (FLEXERIL) 5 MG tablet; Take 1 tablet (5 mg total) by mouth at bedtime.  Chronic right-sided low back pain with right-sided sciatica   I am having Eron Cloyd start on predniSONE and cyclobenzaprine. I am also having him maintain his blood glucose meter kit and supplies, OneTouch Delica Lancets Fine, tamsulosin, metFORMIN, omeprazole, ONE TOUCH ULTRA TEST, and pimecrolimus. We will continue to administer sodium  chloride.  Meds ordered this encounter  Medications  . predniSONE (STERAPRED UNI-PAK 21 TAB) 10 MG (21) TBPK tablet    Sig: As directed on package    Dispense:  21 tablet    Refill:  0    Order Specific Question:   Supervising Provider    Answer:   Lucille Passy [[id number]]  . cyclobenzaprine (FLEXERIL) 5 MG tablet    Sig: Take 1 tablet (5 mg total)  by mouth at bedtime.    Dispense:  7 tablet    Refill:  0    Order Specific Question:   Supervising Provider    Answer:   Lucille Passy [[id number]]    Problem List Items Addressed This Visit      Other   BACK PAIN, LUMBAR, CHRONIC    X-ray lumbar spine done 2008: DDD at L4-5 with disc space narrowing.      Relevant Medications   predniSONE (STERAPRED UNI-PAK 21 TAB) 10 MG (21) TBPK tablet   cyclobenzaprine (FLEXERIL) 5 MG tablet    Other Visit Diagnoses    Leg pain, lateral, right    -  Primary   Relevant Medications   cyclobenzaprine (FLEXERIL) 5 MG tablet   Lumbosacral radiculopathy       Relevant Medications   predniSONE (STERAPRED UNI-PAK 21 TAB) 10 MG (21) TBPK tablet   cyclobenzaprine (FLEXERIL) 5 MG tablet   Other Relevant Orders   MR Lumbar Spine Wo Contrast   Atrophy of quadriceps femoris muscle       Relevant Medications   predniSONE (STERAPRED UNI-PAK 21 TAB) 10 MG (21) TBPK tablet   cyclobenzaprine (FLEXERIL) 5 MG tablet   Other Relevant Orders   MR Lumbar Spine Wo Contrast      Follow-up: Return in about 2 weeks (around 09/03/2018) for right leg pain.  Wilfred Lacy, NP

## 2018-08-20 NOTE — Assessment & Plan Note (Signed)
X-ray lumbar spine done 2008: DDD at L4-5 with disc space narrowing.

## 2018-09-01 ENCOUNTER — Inpatient Hospital Stay: Admission: RE | Admit: 2018-09-01 | Payer: BLUE CROSS/BLUE SHIELD | Source: Ambulatory Visit

## 2018-09-03 ENCOUNTER — Ambulatory Visit: Payer: BLUE CROSS/BLUE SHIELD | Admitting: Nurse Practitioner

## 2018-09-24 ENCOUNTER — Encounter: Payer: BLUE CROSS/BLUE SHIELD | Admitting: Nurse Practitioner

## 2018-10-01 ENCOUNTER — Other Ambulatory Visit: Payer: Self-pay | Admitting: Nurse Practitioner

## 2018-12-29 DIAGNOSIS — L309 Dermatitis, unspecified: Secondary | ICD-10-CM | POA: Diagnosis not present

## 2019-01-14 ENCOUNTER — Other Ambulatory Visit: Payer: Self-pay | Admitting: Nurse Practitioner

## 2019-01-14 DIAGNOSIS — R070 Pain in throat: Secondary | ICD-10-CM

## 2019-01-14 DIAGNOSIS — K2100 Gastro-esophageal reflux disease with esophagitis, without bleeding: Secondary | ICD-10-CM

## 2019-01-14 NOTE — Telephone Encounter (Signed)
LVM for the pt to call back, need to schedule CPE (fasting). Rx sent in 30 tab only per PCP.

## 2019-01-21 ENCOUNTER — Other Ambulatory Visit: Payer: Self-pay

## 2019-01-21 ENCOUNTER — Encounter: Payer: Self-pay | Admitting: Nurse Practitioner

## 2019-01-21 ENCOUNTER — Ambulatory Visit: Payer: BC Managed Care – PPO | Admitting: Nurse Practitioner

## 2019-01-21 VITALS — BP 144/88 | HR 98 | Temp 98.6°F | Ht 64.0 in | Wt 147.4 lb

## 2019-01-21 DIAGNOSIS — M5441 Lumbago with sciatica, right side: Secondary | ICD-10-CM | POA: Diagnosis not present

## 2019-01-21 DIAGNOSIS — M62559 Muscle wasting and atrophy, not elsewhere classified, unspecified thigh: Secondary | ICD-10-CM | POA: Diagnosis not present

## 2019-01-21 DIAGNOSIS — E1165 Type 2 diabetes mellitus with hyperglycemia: Secondary | ICD-10-CM

## 2019-01-21 DIAGNOSIS — M5417 Radiculopathy, lumbosacral region: Secondary | ICD-10-CM

## 2019-01-21 DIAGNOSIS — G8929 Other chronic pain: Secondary | ICD-10-CM

## 2019-01-21 DIAGNOSIS — M79604 Pain in right leg: Secondary | ICD-10-CM

## 2019-01-21 MED ORDER — BLOOD GLUCOSE MONITOR KIT: PACK | 0 refills | Status: AC

## 2019-01-21 MED ORDER — NABUMETONE 500 MG PO TABS: 500.0000 mg | ORAL_TABLET | Freq: Two times a day (BID) | ORAL | 1 refills | Status: DC | PRN

## 2019-01-21 MED ORDER — CYCLOBENZAPRINE HCL 5 MG PO TABS: 5.0000 mg | ORAL_TABLET | Freq: Every day | ORAL | 0 refills | Status: DC

## 2019-01-21 NOTE — Progress Notes (Signed)
Subjective:  Patient ID: Edward Mcgee, male    DOB: 30-Mar-1956  Age: 63 y.o. MRN: [id number]  CC: Leg Pain (pt is c/o of right leg pain,swelling/going on 1 mo/ took tylenol. posion ivy on body/1 mo/--[facility] and finish prednison)  HPI  DM: Reports elevated glucose for last 2weeks after completion of oral prednisone for contact dermatitis. Needs new glucometer. Current use of metformin BID.  Chronic right leg pain: Waxing and waning, interferes with sleep and his job, pain is worse in supine position, moderated improvement with use if muscle relaxant and NSAID. Needs refill MRI lumbar was never scheduled.  Reviewed past Medical, Social and Family history today.  Outpatient Medications Prior to Visit  Medication Sig Dispense Refill  . Cholecalciferol (VITAMIN D3 PO) Take by mouth.    . Multiple Vitamin (MULTIVITAMIN) tablet Take 1 tablet by mouth daily.    Marland Kitchen omeprazole (PRILOSEC) 20 MG capsule Take 1 capsule by mouth once daily 30 capsule 0  . ONE TOUCH ULTRA TEST test strip USE 1 STRIP TO CHECK GLUCOSE BEFORE BREAKFAST AND AT BEDTIME 100 each 4  . ONETOUCH DELICA LANCETS FINE MISC     . tamsulosin (FLOMAX) 0.4 MG CAPS capsule TAKE 1 CAPSULE BY MOUTH ONCE DAILY AFTER  SUPPER 90 capsule 3  . metFORMIN (GLUCOPHAGE) 500 MG tablet Take 1 tablet (500 mg total) by mouth 2 (two) times daily with a meal. 180 tablet 3  . blood glucose meter kit and supplies KIT Dispense based on patient and insurance preference. Use before breakfast and at bedtime. ICD E11.9 (Patient not taking: Reported on 01/21/2019) 1 each 0  . cyclobenzaprine (FLEXERIL) 5 MG tablet Take 1 tablet (5 mg total) by mouth at bedtime. (Patient not taking: Reported on 01/21/2019) 7 tablet 0  . pimecrolimus (ELIDEL) 1 % cream Apply topically 2 (two) times daily. (Patient not taking: Reported on 08/20/2018) 30 g 1  . predniSONE (STERAPRED UNI-PAK 21 TAB) 10 MG (21) TBPK tablet As directed on package (Patient not taking:  Reported on 01/21/2019) 21 tablet 0   Facility-Administered Medications Prior to Visit  Medication Dose Route Frequency Provider Last Rate Last Dose  . 0.9 %  sodium chloride infusion  500 mL Intravenous Continuous Milus Banister, MD        ROS See HPI  Objective:  BP (!) 144/88   Pulse 98   Temp 98.6 F (37 C) (Oral)   Ht '5\' 4"'  (1.626 m)   Wt 147 lb 6.4 oz (66.9 kg)   SpO2 97%   BMI 25.30 kg/m   BP Readings from Last 3 Encounters:  01/21/19 (!) 144/88  08/20/18 136/75  06/25/18 130/86    Wt Readings from Last 3 Encounters:  01/21/19 147 lb 6.4 oz (66.9 kg)  08/20/18 158 lb 9.6 oz (71.9 kg)  06/25/18 158 lb 6.4 oz (71.8 kg)    Physical Exam Vitals signs reviewed.  Cardiovascular:     Rate and Rhythm: Normal rate and regular rhythm.     Pulses: Normal pulses.     Heart sounds: Normal heart sounds.  Pulmonary:     Effort: Pulmonary effort is normal.     Breath sounds: Normal breath sounds.  Abdominal:     General: Bowel sounds are normal.     Palpations: Abdomen is soft.     Tenderness: There is no right CVA tenderness or left CVA tenderness.  Musculoskeletal:        General: Tenderness present. No swelling.  Right hip: Normal.     Left hip: Normal.     Right knee: He exhibits decreased range of motion and deformity. He exhibits no swelling, no effusion and no erythema. Tenderness found. Lateral joint line tenderness noted. No medial joint line and no patellar tendon tenderness noted.     Left knee: Normal.     Right ankle: Normal.     Left ankle: Normal.     Lumbar back: He exhibits tenderness. He exhibits normal range of motion.     Right upper leg: He exhibits no tenderness, no bony tenderness and no edema.     Left upper leg: Normal.     Right lower leg: Normal. No edema.     Left lower leg: No edema.     Right foot: Normal.     Left foot: Normal.     Comments: Right quadricep muscle atrophy  Neurological:     Mental Status: He is alert and  oriented to person, place, and time.  Psychiatric:        Mood and Affect: Mood normal.     Lab Results  Component Value Date   WBC 5.1 07/16/2017   HGB 13.9 07/16/2017   HCT 41.7 07/16/2017   PLT 277.0 07/16/2017   GLUCOSE 151 (H) 01/21/2019   CHOL 192 07/16/2017   TRIG 96.0 07/16/2017   HDL 66.80 07/16/2017   LDLCALC 106 (H) 07/16/2017   ALT 18 07/16/2017   AST 14 07/16/2017   NA 138 01/21/2019   K 4.1 01/21/2019   CL 101 01/21/2019   CREATININE 1.02 01/21/2019   BUN 21 01/21/2019   CO2 30 01/21/2019   TSH 1.75 07/16/2017   PSA 1.89 10/23/2017   HGBA1C 9.2 (H) 01/21/2019   MICROALBUR <0.7 01/21/2019    Assessment & Plan:   Edward Mcgee was seen today for leg pain.  Diagnoses and all orders for this visit:  Chronic right-sided low back pain with right-sided sciatica -     MR Lumbar Spine W Wo Contrast; Future -     cyclobenzaprine (FLEXERIL) 5 MG tablet; Take 1 tablet (5 mg total) by mouth at bedtime. -     nabumetone (RELAFEN) 500 MG tablet; Take 1 tablet (500 mg total) by mouth 2 (two) times daily as needed (with food).  Atrophy of quadriceps femoris muscle  Type 2 diabetes mellitus with hyperglycemia, without long-term current use of insulin (HCC) -     Cancel: Hemoglobin A1c -     Cancel: Basic metabolic panel -     Cancel: Microalbumin / creatinine urine ratio -     blood glucose meter kit and supplies KIT; Dispense based on patient and insurance preference. Use before breakfast and at bedtime. ICD E11.9 -     Basic metabolic panel -     Hemoglobin A1c -     Microalbumin / creatinine urine ratio -     metFORMIN (GLUCOPHAGE) 500 MG tablet; Take 1 tablet (500 mg total) by mouth 2 (two) times daily with a meal. -     glipiZIDE (GLUCOTROL XL) 5 MG 24 hr tablet; Take 1 tablet (5 mg total) by mouth daily with breakfast.  Lumbosacral radiculopathy  Leg pain, lateral, right -     cyclobenzaprine (FLEXERIL) 5 MG tablet; Take 1 tablet (5 mg total) by mouth at  bedtime. -     nabumetone (RELAFEN) 500 MG tablet; Take 1 tablet (500 mg total) by mouth 2 (two) times daily as needed (with food).  I have discontinued Edward Mcgee's pimecrolimus and predniSONE. I am also having him start on nabumetone and glipiZIDE. Additionally, I am having him maintain his OneTouch Delica Lancets Fine, tamsulosin, ONE TOUCH ULTRA TEST, omeprazole, multivitamin, Cholecalciferol (VITAMIN D3 PO), blood glucose meter kit and supplies, cyclobenzaprine, and metFORMIN. We will continue to administer sodium chloride.  Meds ordered this encounter  Medications  . blood glucose meter kit and supplies KIT    Sig: Dispense based on patient and insurance preference. Use before breakfast and at bedtime. ICD E11.9    Dispense:  1 each    Refill:  0    Order Specific Question:   Supervising Provider    Answer:   Lucille Passy [[id number]]    Order Specific Question:   Number of strips    Answer:   100    Order Specific Question:   Number of lancets    Answer:   100  . cyclobenzaprine (FLEXERIL) 5 MG tablet    Sig: Take 1 tablet (5 mg total) by mouth at bedtime.    Dispense:  30 tablet    Refill:  0    Order Specific Question:   Supervising Provider    Answer:   Lucille Passy [[id number]]  . nabumetone (RELAFEN) 500 MG tablet    Sig: Take 1 tablet (500 mg total) by mouth 2 (two) times daily as needed (with food).    Dispense:  60 tablet    Refill:  1    Order Specific Question:   Supervising Provider    Answer:   Lucille Passy [[id number]]  . metFORMIN (GLUCOPHAGE) 500 MG tablet    Sig: Take 1 tablet (500 mg total) by mouth 2 (two) times daily with a meal.    Dispense:  180 tablet    Refill:  3    Order Specific Question:   Supervising Provider    Answer:   MATTHEWS, CODY [[id number]]  . glipiZIDE (GLUCOTROL XL) 5 MG 24 hr tablet    Sig: Take 1 tablet (5 mg total) by mouth daily with breakfast.    Dispense:  90 tablet    Refill:  1    Order Specific Question:   Supervising Provider    Answer:    MATTHEWS, CODY [[id number]]    Problem List Items Addressed This Visit      Endocrine   DM (diabetes mellitus) ([place])    hgba1c 5.9 to 9 Add glipizide and continue metformin      Relevant Medications   blood glucose meter kit and supplies KIT   metFORMIN (GLUCOPHAGE) 500 MG tablet   glipiZIDE (GLUCOTROL XL) 5 MG 24 hr tablet   Other Relevant Orders   Basic metabolic panel (Completed)   Hemoglobin A1c (Completed)   Microalbumin / creatinine urine ratio (Completed)     Other   BACK PAIN, LUMBAR, CHRONIC - Primary   Relevant Medications   cyclobenzaprine (FLEXERIL) 5 MG tablet   nabumetone (RELAFEN) 500 MG tablet   Other Relevant Orders   MR Lumbar Spine W Wo Contrast    Other Visit Diagnoses    Atrophy of quadriceps femoris muscle       Lumbosacral radiculopathy       Relevant Medications   cyclobenzaprine (FLEXERIL) 5 MG tablet   Leg pain, lateral, right       Relevant Medications   cyclobenzaprine (FLEXERIL) 5 MG tablet   nabumetone (RELAFEN) 500 MG tablet       Follow-up: Return in about 3  months (around 04/23/2019) for DM and HTN.  Wilfred Lacy, NP

## 2019-01-21 NOTE — Patient Instructions (Addendum)
You will be contacted to schedule MRI lumbar spine.  Normal urine microalbumin and renal function.  Worsening DM control with increase in HgbA1c 5.9 to 9.2.  Maintain metformin  Added glipizide. New rx sent.  F/up in 33months  Use flexeril and relafen for leg pain.

## 2019-01-22 LAB — MICROALBUMIN / CREATININE URINE RATIO
Creatinine,U: 156.8 mg/dL
Microalb Creat Ratio: 0.4 mg/g (ref 0.0–30.0)
Microalb, Ur: <0.7 mg/dL (ref 0.0–1.9)

## 2019-01-22 LAB — BASIC METABOLIC PANEL
BUN: 21 mg/dL (ref 6–23)
CO2: 30 mEq/L (ref 19–32)
Calcium: 9.9 mg/dL (ref 8.4–10.5)
Chloride: 101 mEq/L (ref 96–112)
Creatinine, Ser: 1.02 mg/dL (ref 0.40–1.50)
GFR: 73.44 mL/min (ref >60.00)
Glucose, Bld: 151 mg/dL — ABNORMAL HIGH (ref 70–99)
Potassium: 4.1 mEq/L (ref 3.5–5.1)
Sodium: 138 mEq/L (ref 135–145)

## 2019-01-22 LAB — HEMOGLOBIN A1C: Hgb A1c MFr Bld: 9.2 % — ABNORMAL HIGH (ref 4.6–6.5)

## 2019-01-24 ENCOUNTER — Encounter: Payer: Self-pay | Admitting: Nurse Practitioner

## 2019-01-24 MED ORDER — METFORMIN HCL 500 MG PO TABS: 500.0000 mg | ORAL_TABLET | Freq: Two times a day (BID) | ORAL | 3 refills | Status: DC

## 2019-01-24 MED ORDER — GLIPIZIDE ER 5 MG PO TB24: 5.0000 mg | ORAL_TABLET | Freq: Every day | ORAL | 1 refills | Status: DC

## 2019-01-24 NOTE — Assessment & Plan Note (Signed)
hgba1c 5.9 to 9 Add glipizide and continue metformin

## 2019-01-28 ENCOUNTER — Encounter: Payer: BLUE CROSS/BLUE SHIELD | Admitting: Nurse Practitioner

## 2019-01-30 ENCOUNTER — Ambulatory Visit (INDEPENDENT_AMBULATORY_CARE_PROVIDER_SITE_OTHER): Payer: BC Managed Care – PPO | Admitting: Nurse Practitioner

## 2019-01-30 ENCOUNTER — Encounter: Payer: Self-pay | Admitting: Nurse Practitioner

## 2019-01-30 ENCOUNTER — Other Ambulatory Visit: Payer: Self-pay

## 2019-01-30 VITALS — Ht 64.0 in | Wt 147.7 lb

## 2019-01-30 DIAGNOSIS — M62559 Muscle wasting and atrophy, not elsewhere classified, unspecified thigh: Secondary | ICD-10-CM | POA: Diagnosis not present

## 2019-01-30 DIAGNOSIS — M5441 Lumbago with sciatica, right side: Secondary | ICD-10-CM

## 2019-01-30 DIAGNOSIS — G8929 Other chronic pain: Secondary | ICD-10-CM

## 2019-01-30 DIAGNOSIS — M79604 Pain in right leg: Secondary | ICD-10-CM | POA: Diagnosis not present

## 2019-01-30 MED ORDER — NABUMETONE 750 MG PO TABS: 750.0000 mg | ORAL_TABLET | Freq: Two times a day (BID) | ORAL | 5 refills | Status: DC | PRN

## 2019-01-30 MED ORDER — GABAPENTIN 100 MG PO CAPS: ORAL_CAPSULE | ORAL | 2 refills | Status: DC

## 2019-01-30 NOTE — Assessment & Plan Note (Addendum)
Chronic pain x 10yrs, associated with right lateral leg pain and quadriceps muscle atrophy,  no improvement with tylenol, NSAIDs, muscle relaxant and home excercises.  mild improvement with oral prednisone home therapy. Insurance declined MRI lumbar spine.  Entered referral to ortho Start gabapentin. Continue relafen  

## 2019-01-30 NOTE — Progress Notes (Signed)
Virtual Visit via Video Note  I connected with Edward Mcgee on 01/30/19 at 11:00 AM EDT by a video enabled telemedicine application and verified that I am speaking with the correct person using two identifiers.  Location: Patient:Home with wife Provider:office   I discussed the limitations of evaluation and management by telemedicine and the availability of in person appointments. The patient expressed understanding and agreed to proceed.  History of Present Illness: Chronic back and leg pain: Worse in supine position, no change in symptoms since last OV. no improvement with flexeril at HS and relafen BID with food.  Ms. Kleinsasser is concerned about weight loss in last 36months 11lbs weight loss since 08/2018 No change in appetite, no change in GI/GU function, no fatigue, no blood in urine/stool. Reports today's glucose of 103 (fasting) Wt Readings from Last 3 Encounters:  01/30/19 147 lb 11.2 oz (67 kg)  01/21/19 147 lb 6.4 oz (66.9 kg)  08/20/18 158 lb 9.6 oz (71.9 kg)   Observations/Objective: Physical Exam  Constitutional: He is oriented to person, place, and time. He appears well-developed. No distress.  Pulmonary/Chest: Effort normal.  Musculoskeletal: Normal range of motion.        General: Tenderness present. No edema.     Comments: Limping gait  Neurological: He is alert and oriented to person, place, and time.  Skin: Skin is warm and dry. No rash noted.   Assessment and Plan: [name] was seen today for follow-up.  Diagnoses and all orders for this visit:  Chronic right-sided low back pain with right-sided sciatica -     gabapentin (NEURONTIN) 100 MG capsule; Take 1 capsule (100 mg total) by mouth at bedtime for 3 days, THEN 1 capsule (100 mg total) 2 (two) times daily for 3 days, THEN 1 capsule (100 mg total) 3 (three) times daily for 24 days. -     AMB referral to orthopedics -     nabumetone (RELAFEN) 750 MG tablet; Take 1 tablet (750 mg total) by mouth 2 (two) times  daily as needed (with food).  Leg pain, lateral, right -     gabapentin (NEURONTIN) 100 MG capsule; Take 1 capsule (100 mg total) by mouth at bedtime for 3 days, THEN 1 capsule (100 mg total) 2 (two) times daily for 3 days, THEN 1 capsule (100 mg total) 3 (three) times daily for 24 days. -     AMB referral to orthopedics -     nabumetone (RELAFEN) 750 MG tablet; Take 1 tablet (750 mg total) by mouth 2 (two) times daily as needed (with food).  Atrophy of quadriceps femoris muscle -     gabapentin (NEURONTIN) 100 MG capsule; Take 1 capsule (100 mg total) by mouth at bedtime for 3 days, THEN 1 capsule (100 mg total) 2 (two) times daily for 3 days, THEN 1 capsule (100 mg total) 3 (three) times daily for 24 days. -     AMB referral to orthopedics   Follow Up Instructions: See avs   I discussed the assessment and treatment plan with the patient. The patient was provided an opportunity to ask questions and all were answered. The patient agreed with the plan and demonstrated an understanding of the instructions.   The patient was advised to call back or seek an in-person evaluation if the symptoms worsen or if the condition fails to improve as anticipated.   Edward Lacy, NP

## 2019-01-30 NOTE — Assessment & Plan Note (Signed)
Chronic pain x 65yrs, associated with right lateral leg pain and quadriceps muscle atrophy,  no improvement with tylenol, NSAIDs, muscle relaxant and home excercises.  mild improvement with oral prednisone home therapy. Insurance declined MRI lumbar spine.  Entered referral to ortho Start gabapentin. Continue relafen

## 2019-01-30 NOTE — Patient Instructions (Addendum)
Stop fexeril Start gabapentin You will be contacted to schedule appt with ortho. Continue to monitor weight 2-3x/week. Call office if continous weight loss. Maintain upcoming appt.

## 2019-02-02 ENCOUNTER — Ambulatory Visit: Payer: Self-pay

## 2019-02-02 ENCOUNTER — Ambulatory Visit (INDEPENDENT_AMBULATORY_CARE_PROVIDER_SITE_OTHER): Payer: BC Managed Care – PPO | Admitting: Family Medicine

## 2019-02-02 ENCOUNTER — Encounter: Payer: Self-pay | Admitting: Family Medicine

## 2019-02-02 DIAGNOSIS — M5441 Lumbago with sciatica, right side: Secondary | ICD-10-CM

## 2019-02-02 DIAGNOSIS — G8929 Other chronic pain: Secondary | ICD-10-CM

## 2019-02-02 DIAGNOSIS — M25561 Pain in right knee: Secondary | ICD-10-CM | POA: Diagnosis not present

## 2019-02-02 MED ORDER — TRAMADOL HCL 50 MG PO TABS: 50.0000 mg | ORAL_TABLET | Freq: Four times a day (QID) | ORAL | 0 refills | Status: DC | PRN

## 2019-02-02 MED ORDER — BACLOFEN 10 MG PO TABS: 5.0000 mg | ORAL_TABLET | Freq: Three times a day (TID) | ORAL | 1 refills | Status: DC | PRN

## 2019-02-02 NOTE — Progress Notes (Signed)
Office Visit Note   Patient: Edward Mcgee           Date of Birth: 1956-04-16           MRN: [id number] Visit Date: 02/02/2019 Requested by: Flossie Buffy, NP [place],  [place] [address] PCP: Flossie Buffy, NP  Subjective: Chief Complaint  Patient presents with  . Lower Back - Pain    Chronic pain x 2-3 years, but worse over the past 6 months. Hurts in right lower back and then from right lateral knee down to the ankle. Stabbing pain.    HPI: He is here with low back and right knee pain.  Interpreter was present today.  He has a chronic history of low back pain, for at least 10 years.  Midline pain without radicular symptoms.  In the past 6 months his symptoms have changed, now he has pain radiating down the right leg and in particular, he has pain on the lateral aspect of his right knee that keeps him from sleeping at night.  He has noticed some swelling on the side of his knee that was not present in the past.  He has tried several different medicines prescribed by his PCP but although they helped initially, they do not seem to be helping now.  He denies any numbness or weakness in his leg, denies any bowel or bladder dysfunction.              ROS: No fevers or chills.  All other systems were reviewed and are negative.  Objective: Vital Signs: There were no vitals taken for this visit.  Physical Exam:  General:  Alert and oriented, in no acute distress. Pulm:  Breathing unlabored. Psy:  Normal mood, congruent affect. Skin: No rash on the skin. Low back: He has tenderness directly over the L5-S1 level in the midline.  No pain in the sciatic notch, straight leg raise negative, lower extremity strength and reflexes are normal. Right knee: No effusion, ligaments are stable.  No patellofemoral crepitus.  Seems to be localized tissue prominence/swelling on the posterior lateral joint line but no distinct nodule felt, minimal joint line tenderness and no  palpable click with McMurray's.  No tenderness to palpation near the fibular head.   Imaging: X-rays lumbar spine: Moderate L5-S1 degenerative disc disease and facet arthropathy.  X-rays right knee: Mild tricompartmental degenerative change, no sign of loose body.  Limited diagnostic ultrasound right knee: Hypoechoic left in the lateral meniscus with a para meniscal cyst.    Assessment & Plan: 1.  Chronic low back pain with L5-S1 degenerative disc disease, question disc protrusion with radiculopathy. -Trial of physical therapy along with medications.  MRI scan if fails to improve.  2.  Chronic right knee pain, could be referred pain from a lumbar radiculopathy but cannot rule out pain from lateral meniscus tear or para meniscal cyst. -Could contemplate injection if treatment of spine problems does not eliminate pain.     Procedures: No procedures performed  No notes on file     PMFS History: Patient Active Problem List   Diagnosis Date Noted  . Atrophy of quadriceps femoris muscle 01/30/2019  . Gastroesophageal reflux disease with esophagitis 12/24/2017  . Groin rash 12/24/2017  . Benign prostatic hyperplasia with incomplete bladder emptying 10/23/2017  . Positive fecal occult blood test 10/23/2017  . Vocal process granuloma 03/01/2017  . Laryngopharyngeal reflux (LPR) 03/01/2017  . Seborrheic dermatitis of scalp 08/16/2016  . Muscle cramps  05/27/2015  . Leg pain, lateral, right 05/27/2015  . AV block, 1st degree 02/10/2013  . DM (diabetes mellitus) (HCC) 02/10/2013  . Hyperlipidemia 02/25/2007  . BACK PAIN, LUMBAR, CHRONIC 05/31/2006   Past Medical History:  Diagnosis Date  . DM (diabetes mellitus), type 2, uncontrolled (HCC) 02/10/2013  . GERD (gastroesophageal reflux disease)     Family History  Problem Relation Age of Onset  . Breast cancer Sister   . Breast cancer Sister   . Uterine cancer Sister   . Diabetes Other   . Colon cancer Neg Hx   . Esophageal  cancer Neg Hx   . Rectal cancer Neg Hx   . Stomach cancer Neg Hx   . Liver cancer Neg Hx     Past Surgical History:  Procedure Laterality Date  . NO PAST SURGERIES     Social History   Occupational History  . Occupation: Maintenance  Tobacco Use  . Smoking status: Never Smoker  . Smokeless tobacco: Never Used  Substance and Sexual Activity  . Alcohol use: Yes    Comment: socially  . Drug use: No  . Sexual activity: Not on file

## 2019-02-11 ENCOUNTER — Other Ambulatory Visit: Payer: Self-pay

## 2019-02-11 ENCOUNTER — Ambulatory Visit (HOSPITAL_COMMUNITY): Payer: BC Managed Care – PPO | Attending: Family Medicine | Admitting: Physical Therapy

## 2019-02-11 ENCOUNTER — Encounter (HOSPITAL_COMMUNITY): Payer: Self-pay | Admitting: Physical Therapy

## 2019-02-11 DIAGNOSIS — M5416 Radiculopathy, lumbar region: Secondary | ICD-10-CM | POA: Diagnosis not present

## 2019-02-11 DIAGNOSIS — M25671 Stiffness of right ankle, not elsewhere classified: Secondary | ICD-10-CM | POA: Diagnosis not present

## 2019-02-11 DIAGNOSIS — R29898 Other symptoms and signs involving the musculoskeletal system: Secondary | ICD-10-CM | POA: Diagnosis not present

## 2019-02-11 NOTE — Patient Instructions (Addendum)
Gastroc Stretch    Stand with right foot back, leg straight, forward leg bent. Keeping heel on floor, turned slightly out, lean into wall until stretch is felt in calf. Hold __30__ seconds. Repeat ___3_ times per set. Do _1___ sets per session. Do __2__ sessions per day.  http://orth.exer.us/26   Copyright  VHI. All rights reserved.  Backward Bend (Standing)    Arch backward to make hollow of back deeper. Hold _3___ seconds. Repeat 10____ times per set. Do ___1_ sets per session. Do _2___ sessions per day.1 http://orth.exer.us/178   Copyright  VHI. All rights reserved.  Press-Up    Press upper body upward, keeping hips in contact with floor. Keep lower back and buttocks relaxed. Hold _3___ seconds. Repeat __5__ times per set. Do _1__ sets per session. Do ___2_ sessions per day.  http://orth.exer.us/94   Copyright  VHI. All rights reserved.  Straight Leg Raise (Prone)    Abdomen and head supported, keep left knee locked and raise leg at hip. Avoid arching low back. Repeat _10___ times per set. Do __1__ sets per session. Do ___2_ sessions per day.  http://orth.exer.us/1112

## 2019-02-11 NOTE — Therapy (Signed)
[facility] [address] [place], [place], [address] Phone: [phone]   Fax:  [phone]  Physical Therapy Evaluation  Patient Details  Name: Edward RileRogelio Mcgee MRN: [id number] Date of Birth: June 02, 1956 Referring Provider (PT): Lavada MesiMichael Hilts    Encounter Date: 02/11/2019  PT End of Session - 02/11/19 1206    Visit Number  1    Number of Visits  6    Date for PT Re-Evaluation  03/04/19    Authorization Type  [employer]    Authorization - Visit Number  1    Authorization - Number of Visits  30    PT Start Time  1000    PT Stop Time  1045    PT Time Calculation (min)  45 min    Activity Tolerance  Patient tolerated treatment well    Behavior During Therapy  [facility] for tasks assessed/performed       Past Medical History:  Diagnosis Date  . DM (diabetes mellitus), type 2, uncontrolled (HCC) 02/10/2013  . GERD (gastroesophageal reflux disease)     Past Surgical History:  Procedure Laterality Date  . NO PAST SURGERIES      There were no vitals filed for this visit.   Subjective Assessment - 02/11/19 0955    Subjective  [name] states that he has had low back pain for years, however, in the past 6 months he began having pain going from his calf to his foot, this is bothering him mort than his back.  .  He has taken medication with minimal improvement.  He is waking up every night due to the pain.  He wakes every 15 minutes the pain will not let him sleep.    Pertinent History  DM    Limitations  Sitting;Lifting;Standing;Walking    How long can you sit comfortably?  After 15 minutes    How long can you stand comfortably?  Depends on what he is doing.    How long can you walk comfortably?  walking if flat is alright but stair climbing is difficult    Diagnostic tests  x ray    Patient Stated Goals  less pain    Currently in Pain?  Yes    Pain Score  2     Pain Location  Leg    Pain Orientation  Right    Pain Descriptors / Indicators   Other (Comment)   feels like it is inside the bone.   Pain Type  Chronic pain    Pain Radiating Towards  Rt calf to foot    Pain Onset  More than a month ago    Pain Frequency  Constant    Aggravating Factors   lying down    Pain Relieving Factors  not sure    Effect of Pain on Daily Activities  keeps doing his ADL's he just has the pain.         [facility] PT Assessment - 02/11/19 0001      Assessment   Medical Diagnosis  Rt Lumbar radiculopathy    Referring Provider (PT)  Casimiro NeedleMichael Hilts     Onset Date/Surgical Date  08/10/18    Next MD Visit  not scheduled     Prior Therapy  none      Precautions   Precautions  None      Restrictions   Weight Bearing Restrictions  No      Balance Screen   Has the patient fallen in the past 6  months  No    Has the patient had a decrease in activity level because of a fear of falling?   Yes    Is the patient reluctant to leave their home because of a fear of falling?   No      Home Public house managernvironment   Living Environment  Private residence      Prior Function   Level of Independence  Independent      Cognition   Overall Cognitive Status  Within Functional Limits for tasks assessed      Observation/Other Assessments   Focus on Therapeutic Outcomes (FOTO)   51      Functional Tests   Functional tests  Single leg stance;Sit to Stand      Single Leg Stance   Comments  LT:  60" ;  RT:  60"        Sit to Stand   Comments  5x 15.92      Posture/Postural Control   Posture/Postural Control  No significant limitations      ROM / Strength   AROM / PROM / Strength  AROM;Strength      AROM   AROM Assessment Site  Ankle;Lumbar    Right/Left Ankle  Right;Left    Right Ankle Dorsiflexion  8    Left Ankle Dorsiflexion  15    Lumbar Flexion  fingers 3 " from floor    no change.    Lumbar Extension  26   reps no change      Strength   Strength Assessment Site  Hip;Knee;Ankle    Right/Left Hip  Right;Left    Right Hip Flexion  5/5    Right  Hip Extension  4-/5    Right Hip ABduction  5/5    Left Hip Flexion  5/5    Left Hip Extension  5/5    Left Hip ABduction  5/5    Right/Left Knee  Right;Left    Right Knee Extension  5/5    Left Knee Extension  5/5    Right/Left Ankle  Right;Left    Right Ankle Dorsiflexion  5/5    Right Ankle Plantar Flexion  4/5    Left Ankle Dorsiflexion  5/5    Left Ankle Plantar Flexion  5/5                Objective measurements completed on examination: See above findings.      [facility] Adult PT Treatment/Exercise - 02/11/19 0001      Exercises   Exercises  Lumbar      Lumbar Exercises: Stretches   Standing Extension  10 reps    Press Ups  5 reps    Gastroc Stretch  Right;3 reps;30 seconds      Lumbar Exercises: Prone   Straight Leg Raise  10 reps             PT Education - 02/11/19 1204    Education Details  HEP, to use a lumbar roll while sitting    Person(s) Educated  Patient    Methods  Explanation    Comprehension  Verbalized understanding       PT Short Term Goals - 02/11/19 1254      PT SHORT TERM GOAL #1   Title  PT to be I in a HEP to decrease pain so pt is waking 4x or less throughout the night.    Time  10    Period  Days    Status  New  Target Date  02/23/19      PT SHORT TERM GOAL #2   Title  PT back and LE flexibilty increase to allow pt to tie his Rt shoe while standing up.    Time  10    Period  Days    Status  New        PT Long Term Goals - 02/11/19 1256      PT LONG TERM GOAL #1   Title  PT strength of LE to be at least 4+/5 to allow pt to go up  two flights of  steps without having to rest.    Time  3    Period  Weeks    Status  New    Target Date  03/06/19      PT LONG TERM GOAL #2   Title  PT to be I in an advance HEP to allow his pain to decrease to no greater than a 3/10 to allow foto to increace 20 pts to demonstrate improved functional ability.    Time  3    Period  Weeks    Status  New      PT LONG TERM GOAL #3    Title  PT to only be waking 2x a week to allow pt to obtain a good night sleep for improved health.    Time  3    Period  Weeks    Status  New             Plan - 02/11/19 1210    Clinical Impression Statement  Mr. Biehn has a hx of chronic back pain.  Approximately six months ago he began having pain from his Rt knee down his calf into his foot every 15 minutes.  The pain is severe enough when it occurs that is wakes him from sleeping, but it only last a short duration, occasionally he needs to get up and walk around to get rid of the pain.  His MD states that he feels the pain is coming from his back and has referred him to skilled PT.  Evaluation demonstrates decreased strength, decreased ROM and increased Pain.  Mr. Mciver will benefit from skilled PT to address these issues and decrease his pain.    Examination-Activity Limitations  Sleep;Squat   unable to squat down while standing to tie Rt shoe, he is able to do this on the lt.   Examination-Participation Restrictions  Other   sleep.  Pt states he does his normal activity he just has pain while doing so.   Stability/Clinical Decision Making  Evolving/Moderate complexity    Clinical Decision Making  Low    Rehab Potential  Good    PT Frequency  2x / week    PT Duration  3 weeks    PT Treatment/Interventions  ADLs/Self Care Home Management;Manual techniques;Patient/family education;Functional mobility training;Therapeutic activities;Therapeutic exercise    PT Next Visit Plan  begin hamstring stretch with flossing, soleus and plantar stretch, heel raises , functional squat progess stability exercises as appropriate, manual to impove mobility.       Patient will benefit from skilled therapeutic intervention in order to improve the following deficits and impairments:  Difficulty walking, Pain, Decreased range of motion, Improper body mechanics, Decreased strength  Visit Diagnosis: Radiculopathy, lumbar region - Plan: PT plan of  care cert/re-cert  Other symptoms and signs involving the musculoskeletal system - Plan: PT plan of care cert/re-cert  Stiffness of right ankle, not elsewhere classified - Plan:  PT plan of care cert/re-cert     Problem List Patient Active Problem List   Diagnosis Date Noted  . Atrophy of quadriceps femoris muscle 01/30/2019  . Gastroesophageal reflux disease with esophagitis 12/24/2017  . Groin rash 12/24/2017  . Benign prostatic hyperplasia with incomplete bladder emptying 10/23/2017  . Positive fecal occult blood test 10/23/2017  . Vocal process granuloma 03/01/2017  . Laryngopharyngeal reflux (LPR) 03/01/2017  . Seborrheic dermatitis of scalp 08/16/2016  . Muscle cramps 05/27/2015  . Leg pain, lateral, right 05/27/2015  . AV block, 1st degree 02/10/2013  . DM (diabetes mellitus) (HCC) 02/10/2013  . Hyperlipidemia 02/25/2007  . BACK PAIN, LUMBAR, CHRONIC 05/31/2006    Virgina Organ, PT CLT [id number]  02/11/2019, 1:04 PM  [facility] [address] [place], [place], [address] Phone: [phone]   Fax:  [phone]  Name: Anass Carnegie MRN: [id number] Date of Birth: [birth year]

## 2019-02-13 ENCOUNTER — Telehealth: Payer: Self-pay | Admitting: Family Medicine

## 2019-02-13 ENCOUNTER — Encounter (HOSPITAL_COMMUNITY): Payer: Self-pay

## 2019-02-13 ENCOUNTER — Ambulatory Visit (HOSPITAL_COMMUNITY): Payer: BC Managed Care – PPO

## 2019-02-13 ENCOUNTER — Other Ambulatory Visit: Payer: Self-pay

## 2019-02-13 ENCOUNTER — Other Ambulatory Visit: Payer: Self-pay | Admitting: Family Medicine

## 2019-02-13 DIAGNOSIS — G8929 Other chronic pain: Secondary | ICD-10-CM

## 2019-02-13 DIAGNOSIS — R29898 Other symptoms and signs involving the musculoskeletal system: Secondary | ICD-10-CM | POA: Diagnosis not present

## 2019-02-13 DIAGNOSIS — M5416 Radiculopathy, lumbar region: Secondary | ICD-10-CM

## 2019-02-13 DIAGNOSIS — M25671 Stiffness of right ankle, not elsewhere classified: Secondary | ICD-10-CM | POA: Diagnosis not present

## 2019-02-13 MED ORDER — HYDROCODONE-ACETAMINOPHEN 5-325 MG PO TABS: 1.0000 | ORAL_TABLET | Freq: Every evening | ORAL | 0 refills | Status: DC | PRN

## 2019-02-13 NOTE — Telephone Encounter (Signed)
I called and spoke with the daughter, Edward Mcgee. Her father (patient) is out of the Tramadol and Baclofen now. He has continued to work, despite the pain in his back this past 2 weeks. She says he never complains, but is complaining now about the pain. He is asking for something stronger for the pain. The tramadol helps some, but not a lot. She also wanted to know if he could get some kind of injection today - I advised we do not have injectable pain medication in the office. He has PT again today at 5:45 pm. Please advise.

## 2019-02-13 NOTE — Telephone Encounter (Signed)
MRI ordered, Norco Rx sent for bedtime use.

## 2019-02-13 NOTE — Therapy (Signed)
[facility] [address] [place], [place], [address] Phone: [phone]   Fax:  [phone]  Physical Therapy Treatment  Patient Details  Name: Edward Mcgee MRN: [id number] Date of Birth: [birth year] Referring Provider (PT): Lavada MesiMichael Hilts    Encounter Date: 02/13/2019  PT End of Session - 02/13/19 1801    Visit Number  2    Number of Visits  6    Date for PT Re-Evaluation  03/04/19    Authorization Type  BCBS    Authorization - Visit Number  2    Authorization - Number of Visits  30    PT Start Time  1751    PT Stop Time  1835    PT Time Calculation (min)  44 min    Activity Tolerance  Patient tolerated treatment well    Behavior During Therapy  [facility] for tasks assessed/performed       Past Medical History:  Diagnosis Date  . DM (diabetes mellitus), type 2, uncontrolled (HCC) 02/10/2013  . GERD (gastroesophageal reflux disease)     Past Surgical History:  Procedure Laterality Date  . NO PAST SURGERIES      There were no vitals filed for this visit.  Subjective Assessment - 02/13/19 1756    Subjective  Pt reports he was able to sleep for 10 minutes due to radicular pain on Rt lateral knee to ankle bone, can pain 10/10 while sitting and at night.  CUrrent pain scale 3-4/10.    Patient Stated Goals  less pain    Currently in Pain?  Yes    Pain Score  4     Pain Location  Leg    Pain Orientation  Right;Lateral    Pain Type  Chronic pain    Pain Onset  More than a month ago    Pain Frequency  Constant    Aggravating Factors   lying down    Pain Relieving Factors  note sure    Effect of Pain on Daily Activities  keep doing his ADL's he just has the pain.  Wife has to drive around due to pain         [facility] 02/13/19 0001      Assessment   Medical Diagnosis  Rt Lumbar radiculopathy    Referring Provider (PT)  Casimiro NeedleMichael Hilts     Onset Date/Surgical Date  08/10/18    Next MD Visit  not scheduled     Prior  Therapy  none                   [facility] Adult PT Treatment/Exercise - 02/13/19 0001      Exercises   Exercises  Lumbar      Lumbar Exercises: Stretches   Active Hamstring Stretch  Right    Active Hamstring Stretch Limitations  Supine hamstring flossing 2 sets; 10 reps    Standing Extension  10 reps    Press Ups  5 reps;10 seconds    Gastroc Stretch  Right;3 reps;30 seconds    Other Lumbar Stretch Exercise  Soleus stretch 3x 30"    Other Lumbar Stretch Exercise  Plantar fascia on step 3x 30"      Lumbar Exercises: Standing   Heel Raises  10 reps;3 seconds    Functional Squats  10 reps    Functional Squats Limitations  front of chair for mechanics      Lumbar Exercises: Prone   Straight Leg Raise  10  reps             PT Education - 02/13/19 1809    Education Details  Reviewed goals, assured compliance wiht HEP.  Pt able to recall and demonstrate appropriate mechanics with all HEP exercises without cueing required.    Person(s) Educated  Patient;Other (comment)   Interpretor   Methods  Explanation;Demonstration    Comprehension  Verbalized understanding;Returned demonstration       PT Short Term Goals - 02/11/19 1254      PT SHORT TERM GOAL #1   Title  PT to be I in a HEP to decrease pain so pt is waking 4x or less throughout the night.    Time  10    Period  Days    Status  New    Target Date  02/23/19      PT SHORT TERM GOAL #2   Title  PT back and LE flexibilty increase to allow pt to tie his Rt shoe while standing up.    Time  10    Period  Days    Status  New        PT Long Term Goals - 02/11/19 1256      PT LONG TERM GOAL #1   Title  PT strength of LE to be at least 4+/5 to allow pt to go up  two flights of  steps without having to rest.    Time  3    Period  Weeks    Status  New    Target Date  03/06/19      PT LONG TERM GOAL #2   Title  PT to be I in an advance HEP to allow his pain to decrease to no greater than a 3/10 to allow  foto to increace 20 pts to demonstrate improved functional ability.    Time  3    Period  Weeks    Status  New      PT LONG TERM GOAL #3   Title  PT to only be waking 2x a week to allow pt to obtain a good night sleep for improved health.    Time  3    Period  Weeks    Status  New            Plan - 02/13/19 1842    Clinical Impression Statement  Began session reviewing goals for therapy with pt and interpretor present.  Educated importance of compliance with HEP.  Pt able to recall and demonstrate appropriate form/mechanics iwht HEP exercises.  Session focus on LE mobility with stretches as well as gluteal strength.  Added stretches and LE strengthening exercises with min cueing to improve form.  Pt educated on benefits/purpose with hamstring flossing to assist with radicular symptoms.  No reports of increased pain thorugh session.    Examination-Activity Limitations  Sleep;Squat   unable to squat down while standing to tie Rt shoe, he is able to do this on the lt   Examination-Participation Restrictions  Other   sleep.  Pt states he does his normal activity he just has pain while doing so.   Stability/Clinical Decision Making  Evolving/Moderate complexity    Clinical Decision Making  Low    Rehab Potential  Good    PT Frequency  2x / week    PT Duration  3 weeks    PT Treatment/Interventions  ADLs/Self Care Home Management;Manual techniques;Patient/family education;Functional mobility training;Therapeutic activities;Therapeutic exercise    PT Next Visit Plan  Continue hamstring stretch with flossing, soleus and plantar stretch, heel raises , functional squat progess stability exercises as appropriate, manual to impove mobility.    PT Home Exercise Plan  eval: gastroc st, POE, prone SLR, standing extension; 02/13/19: heel raise, soleus and plantar stretches       Patient will benefit from skilled therapeutic intervention in order to improve the following deficits and impairments:   Difficulty walking, Pain, Decreased range of motion, Improper body mechanics, Decreased strength  Visit Diagnosis: Radiculopathy, lumbar region  Other symptoms and signs involving the musculoskeletal system  Stiffness of right ankle, not elsewhere classified     Problem List Patient Active Problem List   Diagnosis Date Noted  . Atrophy of quadriceps femoris muscle 01/30/2019  . Gastroesophageal reflux disease with esophagitis 12/24/2017  . Groin rash 12/24/2017  . Benign prostatic hyperplasia with incomplete bladder emptying 10/23/2017  . Positive fecal occult blood test 10/23/2017  . Vocal process granuloma 03/01/2017  . Laryngopharyngeal reflux (LPR) 03/01/2017  . Seborrheic dermatitis of scalp 08/16/2016  . Muscle cramps 05/27/2015  . Leg pain, lateral, right 05/27/2015  . AV block, 1st degree 02/10/2013  . DM (diabetes mellitus) (HCC) 02/10/2013  . Hyperlipidemia 02/25/2007  . BACK PAIN, LUMBAR, CHRONIC 05/31/2006   Becky Sax, LPTA; CBIS [phone]  Juel Burrow 02/13/2019, 6:49 PM  [facility] [address] [place], [place], [address] Phone: [phone]   Fax:  [phone]  Name: Edward Mcgee MRN: [id number] Date of Birth: 1956/01/16

## 2019-02-13 NOTE — Telephone Encounter (Signed)
Patient's daughter called advised her father is in so much pain. Edward Mcgee asked if he can get an injection today. Edward Mcgee said the medication he is taking is not helping with the pain. She advised he father look like he is going to cry due to being in so much pain. The number to contact Edward Mcgee is [phone]

## 2019-02-13 NOTE — Telephone Encounter (Signed)
I advised Edward Mcgee of the [employer] Rx to take at bedtime only. He should continue the nabumetome bid and the baclofen as needed - he has a refill left on this one. I advised her that someone will be calling her or the patient to schedule the MRI in the near future. He should keep his appointment with PT later today.

## 2019-02-13 NOTE — Patient Instructions (Signed)
Plantar Fascia Stretch    Apoyado nicamente sobre la punta del pie izquierdo sobre un escaln, deje caer el taln hasta sentir un estiramiento en el arco del pie. Sostenga 30 segundos. Descanse. Repita 3 veces por rutina. Realice 2 sesiones por da.  http://orth.exer.us/22   Copyright  VHI. All rights reserved.   Soleus Stretch    De pie, con el pie derecho atrs, y ambas rodillas flexionadas inclnese suavemente hacia adelante hasta sentir un estiramiento en la parte inferior de la pantorrilla. Mantenga el taln sobre el piso y apuntando ligeramente hacia fuera. Sostenga 30 segundos. Descanse. Repita 3 veces por rutina. Realice 2 sesiones por da.  http://orth.exer.us/24   Copyright  VHI. All rights reserved.   Heel Raise: Bilateral (Standing)    Parece sobre las puntas de los pies. Repita 10-15 veces por rutina. Realice 1-2 sesiones por da.  http://orth.exer.us/38   Copyright  VHI. All rights reserved.

## 2019-02-17 ENCOUNTER — Other Ambulatory Visit: Payer: Self-pay | Admitting: Nurse Practitioner

## 2019-02-17 DIAGNOSIS — N401 Enlarged prostate with lower urinary tract symptoms: Secondary | ICD-10-CM

## 2019-02-17 DIAGNOSIS — K2100 Gastro-esophageal reflux disease with esophagitis, without bleeding: Secondary | ICD-10-CM

## 2019-02-17 DIAGNOSIS — R070 Pain in throat: Secondary | ICD-10-CM

## 2019-02-20 ENCOUNTER — Telehealth: Payer: Self-pay | Admitting: Family Medicine

## 2019-02-20 NOTE — Telephone Encounter (Signed)
pls advise

## 2019-02-20 NOTE — Telephone Encounter (Signed)
Patient's daughter called. Would like a refill on his hydrocodone. He only has 4 left. Her name is Benjamine Mola and her call back number is [phone]. Thanks

## 2019-02-23 MED ORDER — HYDROCODONE-ACETAMINOPHEN 5-325 MG PO TABS: 1.0000 | ORAL_TABLET | Freq: Every evening | ORAL | 0 refills | Status: DC | PRN

## 2019-02-23 NOTE — Telephone Encounter (Signed)
Sent!

## 2019-02-23 NOTE — Addendum Note (Signed)
Addended by: Hortencia Pilar on: 02/23/2019 07:47 AM   Modules accepted: Orders

## 2019-02-23 NOTE — Telephone Encounter (Signed)
IC LMVM advising rx submitted.

## 2019-02-26 ENCOUNTER — Other Ambulatory Visit: Payer: Self-pay

## 2019-02-26 ENCOUNTER — Ambulatory Visit (HOSPITAL_COMMUNITY): Payer: BC Managed Care – PPO

## 2019-02-26 ENCOUNTER — Encounter (HOSPITAL_COMMUNITY): Payer: Self-pay

## 2019-02-26 DIAGNOSIS — M25671 Stiffness of right ankle, not elsewhere classified: Secondary | ICD-10-CM | POA: Diagnosis not present

## 2019-02-26 DIAGNOSIS — R29898 Other symptoms and signs involving the musculoskeletal system: Secondary | ICD-10-CM | POA: Diagnosis not present

## 2019-02-26 DIAGNOSIS — M5416 Radiculopathy, lumbar region: Secondary | ICD-10-CM

## 2019-02-26 NOTE — Therapy (Signed)
[place], [place], [address] Phone: [phone]   Fax:  [phone]  Physical Therapy Treatment  Patient Details  Name: Edward Mcgee MRN: [id number] Date of Birth: Sep 13, 1955 Referring Provider (PT): Eunice Blase    Encounter Date: 02/26/2019  PT End of Session - 02/26/19 1758    Visit Number  3    Number of Visits  6    Date for PT Re-Evaluation  03/04/19    Authorization Type  BCBS    Authorization - Visit Number  3    Authorization - Number of Visits  30    PT Start Time  9563    PT Stop Time  1835    PT Time Calculation (min)  48 min    Activity Tolerance  Patient tolerated treatment well    Behavior During Therapy  [facility] for tasks assessed/performed       Past Medical History:  Diagnosis Date  . DM (diabetes mellitus), type 2, uncontrolled ([place]) 02/10/2013  . GERD (gastroesophageal reflux disease)     Past Surgical History:  Procedure Laterality Date  . NO PAST SURGERIES      There were no vitals filed for this visit.  Subjective Assessment - 02/26/19 1747    Subjective  Pt reports some pain scale 2/10 stabbing and burning pain Rt lateral knee to ankle bone.  Reports he has good and bad days.  The pain does wake me up at night    Patient Stated Goals  less pain    Currently in Pain?  Yes    Pain Score  2     Pain Location  Leg    Pain Orientation  Right;Distal;Lateral    Pain Descriptors / Indicators  Burning;Stabbing    Pain Type  Chronic pain    Pain Radiating Towards  Rt calf to foot    Pain Onset  More than a month ago    Pain Frequency  Constant    Aggravating Factors   lying down    Pain Relieving Factors  not sure    Effect of Pain on Daily Activities  keep doing his ADL's he just has the pain.  Wife has to drive around due to pain.         [facility] PT Assessment - 02/26/19 0001      Assessment   Medical Diagnosis  Rt Lumbar radiculopathy    Referring Provider (PT)  Legrand Como Hilts      Onset Date/Surgical Date  08/10/18    Next MD Visit  03/05/19: MRI scheduled    Prior Therapy  none                   [facility] Adult PT Treatment/Exercise - 02/26/19 0001      Exercises   Exercises  Lumbar      Lumbar Exercises: Stretches   Active Hamstring Stretch  Right    Active Hamstring Stretch Limitations  Supine hamstring flossing 2 sets; 10 reps    Lower Trunk Rotation  5 reps;10 seconds    Standing Extension  10 reps    Prone on Elbows Stretch  1 rep   2 minutes   Press Ups  5 reps;10 seconds    Gastroc Stretch  2 reps;30 seconds    Gastroc Stretch Limitations  slant board    Other Lumbar Stretch Exercise  Plantar fascia on step 3x 30"      Lumbar Exercises: Standing   Functional Squats  10 reps    Functional Squats Limitations  front of chair for mechanics      Lumbar Exercises: Supine   Bridge  10 reps;3 seconds    Bridge Limitations  2 sets      Lumbar Exercises: Prone   Straight Leg Raise  10 reps               PT Short Term Goals - 02/11/19 1254      PT SHORT TERM GOAL #1   Title  PT to be I in a HEP to decrease pain so pt is waking 4x or less throughout the night.    Time  10    Period  Days    Status  New    Target Date  02/23/19      PT SHORT TERM GOAL #2   Title  PT back and LE flexibilty increase to allow pt to tie his Rt shoe while standing up.    Time  10    Period  Days    Status  New        PT Long Term Goals - 02/11/19 1256      PT LONG TERM GOAL #1   Title  PT strength of LE to be at least 4+/5 to allow pt to go up  two flights of  steps without having to rest.    Time  3    Period  Weeks    Status  New    Target Date  03/06/19      PT LONG TERM GOAL #2   Title  PT to be I in an advance HEP to allow his pain to decrease to no greater than a 3/10 to allow foto to increace 20 pts to demonstrate improved functional ability.    Time  3    Period  Weeks    Status  New      PT LONG TERM GOAL #3   Title  PT to  only be waking 2x a week to allow pt to obtain a good night sleep for improved health.    Time  3    Period  Weeks    Status  New            Plan - 02/26/19 1842    Clinical Impression Statement  Continued session focus with gluteal strengthening and stretches to assist with pain control.  Pt with positive results following hamstring floss, required increased cueing for proper mechanics.  No reports of pain at EOS, did reports intermittent pain scale with some supine exercises though difficulty to assess with language barrier.  Interruptor present through session to assist.    Examination-Activity Limitations  Sleep;Squat   unable to squat down while standing to tie Rt shoe, he is able to do this on the lt.   Examination-Participation Restrictions  Other   sleep.  Pt states he does his normal activity he just has pain while doing so.   Stability/Clinical Decision Making  Evolving/Moderate complexity    Clinical Decision Making  Low    Rehab Potential  Good    PT Frequency  2x / week    PT Duration  3 weeks    PT Treatment/Interventions  ADLs/Self Care Home Management;Manual techniques;Patient/family education;Functional mobility training;Therapeutic activities;Therapeutic exercise    PT Next Visit Plan  Next session begin step down training.  Continue hamstring stretch with flossing and LE stretches.  Manual to improve mobility.    PT Home Exercise Plan  eval: gastroc st, POE, prone SLR, standing extension; 02/13/19: heel raise, soleus and plantar stretches       Patient will benefit from skilled therapeutic intervention in order to improve the following deficits and impairments:  Difficulty walking, Pain, Decreased range of motion, Improper body mechanics, Decreased strength  Visit Diagnosis: Radiculopathy, lumbar region  Other symptoms and signs involving the musculoskeletal system  Stiffness of right ankle, not elsewhere classified     Problem List Patient Active Problem  List   Diagnosis Date Noted  . Atrophy of quadriceps femoris muscle 01/30/2019  . Gastroesophageal reflux disease with esophagitis 12/24/2017  . Groin rash 12/24/2017  . Benign prostatic hyperplasia with incomplete bladder emptying 10/23/2017  . Positive fecal occult blood test 10/23/2017  . Vocal process granuloma 03/01/2017  . Laryngopharyngeal reflux (LPR) 03/01/2017  . Seborrheic dermatitis of scalp 08/16/2016  . Muscle cramps 05/27/2015  . Leg pain, lateral, right 05/27/2015  . AV block, 1st degree 02/10/2013  . DM (diabetes mellitus) (HCC) 02/10/2013  . Hyperlipidemia 02/25/2007  . BACK PAIN, LUMBAR, CHRONIC 05/31/2006   Becky Saxasey Previn Jian, LPTA; CBIS [phone]  Juel BurrowCockerham, Lyssa Hackley Jo 02/26/2019, 6:48 PM  [facility] [address] [place], [place], [address] Phone: [phone]   Fax:  [phone]  Name: Edward RileRogelio Mcgee MRN: [id number] Date of Birth: 04/16/1956

## 2019-02-27 ENCOUNTER — Ambulatory Visit (HOSPITAL_COMMUNITY): Payer: BC Managed Care – PPO

## 2019-02-27 ENCOUNTER — Encounter (HOSPITAL_COMMUNITY): Payer: Self-pay

## 2019-02-27 DIAGNOSIS — M25671 Stiffness of right ankle, not elsewhere classified: Secondary | ICD-10-CM

## 2019-02-27 DIAGNOSIS — M5416 Radiculopathy, lumbar region: Secondary | ICD-10-CM | POA: Diagnosis not present

## 2019-02-27 DIAGNOSIS — R29898 Other symptoms and signs involving the musculoskeletal system: Secondary | ICD-10-CM | POA: Diagnosis not present

## 2019-02-27 NOTE — Therapy (Signed)
[place], [place], [address] Phone: [phone]   Fax:  [phone]  Physical Therapy Treatment  Patient Details  Name: Edward Mcgee MRN: [id number] Date of Birth: 17-Oct-1955 Referring Provider (PT): Eunice Blase    Encounter Date: 02/27/2019  PT End of Session - 02/27/19 1754    Visit Number  4    Number of Visits  6    Date for PT Re-Evaluation  03/04/19    Authorization Type  BCBS    Authorization - Visit Number  4    Authorization - Number of Visits  30    PT Start Time  9735    PT Stop Time  1832    PT Time Calculation (min)  41 min    Activity Tolerance  Patient tolerated treatment well    Behavior During Therapy  [facility] for tasks assessed/performed       Past Medical History:  Diagnosis Date  . DM (diabetes mellitus), type 2, uncontrolled ([place]) 02/10/2013  . GERD (gastroesophageal reflux disease)     Past Surgical History:  Procedure Laterality Date  . NO PAST SURGERIES      There were no vitals filed for this visit.  Subjective Assessment - 02/27/19 1754    Subjective  Pt stated he is feeling good today, no reoprts of pain today.    Patient Stated Goals  less pain    Currently in Pain?  No/denies                       [facility] Adult PT Treatment/Exercise - 02/27/19 0001      Exercises   Exercises  Lumbar      Lumbar Exercises: Stretches   Active Hamstring Stretch  Right;3 reps;20 seconds    Active Hamstring Stretch Limitations  12" step    Standing Extension  10 reps    Press Ups  5 reps;10 seconds    Gastroc Stretch  2 reps;30 seconds    Gastroc Stretch Limitations  slant board    Other Lumbar Stretch Exercise  Knee drive on 32DJ step for dorsiflexion 10x 10"    Other Lumbar Stretch Exercise  --      Lumbar Exercises: Standing   Heel Raises  15 reps    Heel Raises Limitations  squat then heel raise no HHA    Functional Squats  15 reps    Functional Squats  Limitations  squat then heel raise no HHA    Other Standing Lumbar Exercises  Step down training 4in step 10x      Lumbar Exercises: Supine   Bridge  15 reps    Bridge Limitations  5" holds    Other Supine Lumbar Exercises  Manual Hamstring flossing      Manual Therapy   Manual Therapy  Soft tissue mobilization    Manual therapy comments  Manual complete separate than rest of tx    Soft tissue mobilization  STM to gastroc/soleus complex; PROM DF                PT Short Term Goals - 02/11/19 1254      PT SHORT TERM GOAL #1   Title  PT to be I in a HEP to decrease pain so pt is waking 4x or less throughout the night.    Time  10    Period  Days    Status  New    Target Date  02/23/19  PT SHORT TERM GOAL #2   Title  PT back and LE flexibilty increase to allow pt to tie his Rt shoe while standing up.    Time  10    Period  Days    Status  New        PT Long Term Goals - 02/11/19 1256      PT LONG TERM GOAL #1   Title  PT strength of LE to be at least 4+/5 to allow pt to go up  two flights of  steps without having to rest.    Time  3    Period  Weeks    Status  New    Target Date  03/06/19      PT LONG TERM GOAL #2   Title  PT to be I in an advance HEP to allow his pain to decrease to no greater than a 3/10 to allow foto to increace 20 pts to demonstrate improved functional ability.    Time  3    Period  Weeks    Status  New      PT LONG TERM GOAL #3   Title  PT to only be waking 2x a week to allow pt to obtain a good night sleep for improved health.    Time  3    Period  Weeks    Status  New            Plan - 02/27/19 1838    Clinical Impression Statement  Added knee drives for dorsiflexion, standing hamstring stretch and progress squat and heel raises combo without HHA.  Added manual to address restricitons in gastroc/soleus complex for ankle mobility.  Continues wiht manual hamstring floss with positive results from pt following.  Began step  down training wiht reports of increased ease following stretches and manual.  No reports of pain through session.    Examination-Activity Limitations  Sleep;Squat   unable to squat down while standing to tie Rt shoe, he is able to do this on the lt.   Examination-Participation Restrictions  --   sleep.  Pt states he does his normal activity he just has pain while doing so.   Stability/Clinical Decision Making  Evolving/Moderate complexity    Clinical Decision Making  Low    Rehab Potential  Good    PT Frequency  2x / week    PT Duration  3 weeks    PT Treatment/Interventions  ADLs/Self Care Home Management;Manual techniques;Patient/family education;Functional mobility training;Therapeutic activities;Therapeutic exercise    PT Next Visit Plan  Increase height with step down training next session.  Continues hamstring streches and flossing as well as LE stretches.  Manual to improve mobility.    PT Home Exercise Plan  eval: gastroc st, POE, prone SLR, standing extension; 02/13/19: heel raise, soleus and plantar stretches       Patient will benefit from skilled therapeutic intervention in order to improve the following deficits and impairments:  Difficulty walking, Pain, Decreased range of motion, Improper body mechanics, Decreased strength  Visit Diagnosis: Other symptoms and signs involving the musculoskeletal system  Stiffness of right ankle, not elsewhere classified  Radiculopathy, lumbar region     Problem List Patient Active Problem List   Diagnosis Date Noted  . Atrophy of quadriceps femoris muscle 01/30/2019  . Gastroesophageal reflux disease with esophagitis 12/24/2017  . Groin rash 12/24/2017  . Benign prostatic hyperplasia with incomplete bladder emptying 10/23/2017  . Positive fecal occult blood test 10/23/2017  .  Vocal process granuloma 03/01/2017  . Laryngopharyngeal reflux (LPR) 03/01/2017  . Seborrheic dermatitis of scalp 08/16/2016  . Muscle cramps 05/27/2015  .  Leg pain, lateral, right 05/27/2015  . AV block, 1st degree 02/10/2013  . DM (diabetes mellitus) (HCC) 02/10/2013  . Hyperlipidemia 02/25/2007  . BACK PAIN, LUMBAR, CHRONIC 05/31/2006   Becky Sax, LPTA; CBIS [phone]  Juel Burrow 02/27/2019, 7:01 PM  [facility] [address] [place], [place], [address] Phone: [phone]   Fax:  [phone]  Name: Edward Mcgee MRN: [id number] Date of Birth: 30-Apr-1956

## 2019-03-05 ENCOUNTER — Ambulatory Visit (HOSPITAL_COMMUNITY): Payer: BC Managed Care – PPO

## 2019-03-05 ENCOUNTER — Other Ambulatory Visit: Payer: Self-pay

## 2019-03-05 ENCOUNTER — Encounter (HOSPITAL_COMMUNITY): Payer: Self-pay

## 2019-03-05 ENCOUNTER — Ambulatory Visit
Admission: RE | Admit: 2019-03-05 | Discharge: 2019-03-05 | Disposition: A | Discharge status: Home / Self Care | Payer: BC Managed Care – PPO | Source: Ambulatory Visit | Attending: Family Medicine | Admitting: Family Medicine

## 2019-03-05 DIAGNOSIS — M48061 Spinal stenosis, lumbar region without neurogenic claudication: Secondary | ICD-10-CM | POA: Diagnosis not present

## 2019-03-05 DIAGNOSIS — M25671 Stiffness of right ankle, not elsewhere classified: Secondary | ICD-10-CM | POA: Diagnosis not present

## 2019-03-05 DIAGNOSIS — R29898 Other symptoms and signs involving the musculoskeletal system: Secondary | ICD-10-CM

## 2019-03-05 DIAGNOSIS — M5416 Radiculopathy, lumbar region: Secondary | ICD-10-CM | POA: Diagnosis not present

## 2019-03-05 DIAGNOSIS — G8929 Other chronic pain: Secondary | ICD-10-CM

## 2019-03-05 NOTE — Therapy (Addendum)
[facility] [address] [place], [place], [address] Phone: [phone]   Fax:  [phone]  Physical Therapy Treatment  Patient Details  Name: Edward Mcgee MRN: [id number] Date of Birth: Jun 03, 1956 Referring Provider (PT): Eunice Blase    Encounter Date: 03/05/2019 PHYSICAL THERAPY DISCHARGE SUMMARY  Visits from Start of Care: 5  Current functional level related to goals / functional outcomes: PT sleeping no pain    Remaining deficits: n/a   Education / Equipment: HEP Plan: Patient agrees to discharge.  Patient goals were not met. Patient is being discharged due to meeting the stated rehab goals.  ?????     PT End of Session - 03/05/19 1811    Visit Number  6   Number of Visits  6    Date for PT Re-Evaluation  03/04/19    Authorization Type  BC[employer]    Authorization - Visit Number  5    Authorization - Number of Visits  30    PT Start Time  6967    PT Stop Time  1808    PT Time Calculation (min)  23 min    Activity Tolerance  Patient tolerated treatment well    Behavior During Therapy  WFL for tasks assessed/performed       Past Medical History:  Diagnosis Date  . DM (diabetes mellitus), type 2, uncontrolled ([place]) 02/10/2013  . GERD (gastroesophageal reflux disease)     Past Surgical History:  Procedure Laterality Date  . NO PAST SURGERIES      There were no vitals filed for this visit.      [facility] PT Assessment - 03/05/19 0001      Assessment   Medical Diagnosis  Rt Lumbar radiculopathy    Referring Provider (PT)  Legrand Como Hilts     Onset Date/Surgical Date  08/10/18    Next MD Visit  03/05/19: MRI scheduled    Prior Therapy  none      Observation/Other Assessments   Focus on Therapeutic Outcomes (FOTO)   --   was 71     Sit to Stand   Comments  --   5x 15.92     AROM   AROM Assessment Site  Ankle;Lumbar    Right Ankle Dorsiflexion  18   was 8   Left Ankle Dorsiflexion  16   was 15   Lumbar  Flexion  fingers touch floor, no pain   fingers 3 " from floor    Lumbar Extension  26   was 26     Strength   Right Hip Extension  4+/5   was 4-/5   Right Ankle Plantar Flexion  4+/5   was 4/5                  [facility] Adult PT Treatment/Exercise - 03/05/19 0001      Exercises   Exercises  Lumbar      Lumbar Exercises: Standing   Heel Raises  15 reps    Heel Raises Limitations  squat then heel raise no HHA    Other Standing Lumbar Exercises  5RT reciprocal pattern no HHA no pain (20 steps total)             PT Education - 03/05/19 1820    Education Details  Reviewed exercises to continue following DC.  FOTO    Person(s) Educated  Patient    Methods  Explanation    Comprehension  Verbalized understanding  PT Short Term Goals - 03/05/19 1748      PT SHORT TERM GOAL #1   Title  PT to be I in a HEP to decrease pain so pt is waking 4x or less throughout the night.    Baseline  03/05/19:  Reports compliance with all HEP exercises daily    Status  Achieved      PT SHORT TERM GOAL #2   Title  PT back and LE flexibilty increase to allow pt to tie his Rt shoe while standing up.    Baseline  03/05/19:  Able to touch toes while standing without pain.    Status  Achieved        PT Long Term Goals - 03/05/19 1749      PT LONG TERM GOAL #1   Title  PT strength of LE to be at least 4+/5 to allow pt to go up  two flights of  steps without having to rest.    Baseline  9/24:  Able to complete 5RT (5 steps per trip) without rest of pain (20 steps reciprocal pattern, no HHA), see MMT    Status  Achieved      PT LONG TERM GOAL #2   Title  PT to be I in an advance HEP to allow his pain to decrease to no greater than a 3/10 to allow foto to increace 20 pts to demonstrate improved functional ability.    Baseline  9/24:  Reports pain free, able to sleep through the night and FOTO improved from 49% limited to 6% limitation.    Status  Achieved      PT LONG TERM GOAL  #3   Title  PT to only be waking 2x a week to allow pt to obtain a good night sleep for improved health.    Baseline  03/05/19:  Reports ability to sleep without waking due to pain.    Status  Achieved            Plan - 03/05/19 1815    Clinical Impression Statement  Pt arrived with big grin on face, reports he has slept all week without waking due to pain.  Reviewed goals with the following findings:  improved strength to 4+/5, able to complete 20 steps reciprocal pattern without pain, improved lumbar and ankle mobility and FOTO at 6% limitation.  Reviewed exercises/stretches to continue at home wiht verbalized understanding.    Examination-Activity Limitations  Sleep;Squat   unable to squat down while standing to tie Rt shoe, he is able to do this on the lt.   Examination-Participation Restrictions  --   sleep.  Pt states he does his normal activity he just has pain while doing so.   Stability/Clinical Decision Making  Evolving/Moderate complexity    Clinical Decision Making  Low    Rehab Potential  Good    PT Frequency  2x / week    PT Duration  3 weeks    PT Treatment/Interventions  ADLs/Self Care Home Management;Manual techniques;Patient/family education;Functional mobility training;Therapeutic activities;Therapeutic exercise    PT Next Visit Plan  DC to HEP per all goals met.    PT Home Exercise Plan  eval: gastroc st, POE, prone SLR, standing extension; 02/13/19: heel raise, soleus and plantar stretches       Patient will benefit from skilled therapeutic intervention in order to improve the following deficits and impairments:  Difficulty walking, Pain, Decreased range of motion, Improper body mechanics, Decreased strength  Visit Diagnosis: Other symptoms  and signs involving the musculoskeletal system  Stiffness of right ankle, not elsewhere classified  Radiculopathy, lumbar region     Problem List Patient Active Problem List   Diagnosis Date Noted  . Atrophy of  quadriceps femoris muscle 01/30/2019  . Gastroesophageal reflux disease with esophagitis 12/24/2017  . Groin rash 12/24/2017  . Benign prostatic hyperplasia with incomplete bladder emptying 10/23/2017  . Positive fecal occult blood test 10/23/2017  . Vocal process granuloma 03/01/2017  . Laryngopharyngeal reflux (LPR) 03/01/2017  . Seborrheic dermatitis of scalp 08/16/2016  . Muscle cramps 05/27/2015  . Leg pain, lateral, right 05/27/2015  . AV block, 1st degree 02/10/2013  . DM (diabetes mellitus) ([place]) 02/10/2013  . Hyperlipidemia 02/25/2007  . BACK PAIN, LUMBAR, CHRONIC 05/31/2006   Ihor Austin, [place]; [place]  Rayetta Humphrey, PT CLT [phone] 03/05/2019, 6:22 PM  [facility] [place], [place], [address] Phone: [phone]   Fax:  [phone]  Name: Male Minish MRN: [id number] Date of Birth: 06/27/1955

## 2019-03-06 ENCOUNTER — Ambulatory Visit (HOSPITAL_COMMUNITY): Payer: BC Managed Care – PPO

## 2019-03-08 ENCOUNTER — Telehealth: Payer: Self-pay | Admitting: Family Medicine

## 2019-03-08 DIAGNOSIS — G8929 Other chronic pain: Secondary | ICD-10-CM

## 2019-03-08 NOTE — Telephone Encounter (Signed)
Lumbar MRI shows severe narrowing of the nerve openings at multiple levels due to bulging discs and bone spurs.  The worst level is L5-S1, where the S1 nerves are being contacted.    If not feeling any better, could refer for epidural steroid injection.

## 2019-03-11 NOTE — Telephone Encounter (Signed)
Would you please call this patient at your convenience (Spanish is needed)?

## 2019-03-13 ENCOUNTER — Telehealth: Payer: Self-pay | Admitting: Family Medicine

## 2019-03-13 NOTE — Telephone Encounter (Signed)
I called and left a message on the voice mail to give [place] a call back on Monday (office now closed).

## 2019-03-13 NOTE — Telephone Encounter (Signed)
Pt called in wanting to get the results of his mri over the phone, pt speaks spanish so would like for you to speak to his daughter Benjamine Mola.   [phone]

## 2019-03-13 NOTE — Telephone Encounter (Signed)
Holding for Liz ?

## 2019-03-16 NOTE — Telephone Encounter (Signed)
Tried to call Edward Mcgee again - got her voice mail.

## 2019-03-16 NOTE — Telephone Encounter (Signed)
Orders placed.

## 2019-03-16 NOTE — Telephone Encounter (Signed)
Would like to proceed with ESI.

## 2019-03-16 NOTE — Addendum Note (Signed)
Addended by: Hortencia Pilar on: 03/16/2019 04:53 PM   Modules accepted: Orders

## 2019-03-16 NOTE — Telephone Encounter (Signed)
Called to explain. Would like to go ahead and have injection done.

## 2019-03-16 NOTE — Telephone Encounter (Signed)
Kathlee Nations notified the patient today. See the other message regarding the patient's MRI.

## 2019-03-31 ENCOUNTER — Other Ambulatory Visit: Payer: Self-pay

## 2019-03-31 DIAGNOSIS — Z20822 Contact with and (suspected) exposure to covid-19: Secondary | ICD-10-CM

## 2019-04-02 LAB — NOVEL CORONAVIRUS, NAA: SARS-CoV-2, NAA: NOT DETECTED

## 2019-04-09 ENCOUNTER — Encounter: Payer: Self-pay | Admitting: Physical Medicine and Rehabilitation

## 2019-04-09 ENCOUNTER — Ambulatory Visit (INDEPENDENT_AMBULATORY_CARE_PROVIDER_SITE_OTHER): Payer: BC Managed Care – PPO | Admitting: Physical Medicine and Rehabilitation

## 2019-04-09 ENCOUNTER — Other Ambulatory Visit: Payer: Self-pay

## 2019-04-09 ENCOUNTER — Ambulatory Visit: Payer: Self-pay

## 2019-04-09 VITALS — BP 151/88 | HR 85

## 2019-04-09 DIAGNOSIS — M5416 Radiculopathy, lumbar region: Secondary | ICD-10-CM

## 2019-04-09 MED ORDER — BETAMETHASONE SOD PHOS & ACET 6 (3-3) MG/ML IJ SUSP
12.0000 mg | Freq: Once | INTRAMUSCULAR | Status: AC
  Administered 2019-04-09: 12 mg

## 2019-04-09 NOTE — Procedures (Signed)
Lumbosacral Transforaminal Epidural Steroid Injection - Sub-Pedicular Approach with Fluoroscopic Guidance  Patient: Edward Mcgee      Date of Birth: [birth year] MRN: [id number] PCP: Flossie Buffy, NP      Visit Date: 04/09/2019   Universal Protocol:    Date/Time: 04/09/2019  Consent Given By: the patient  Position: PRONE  Additional Comments: Vital signs were monitored before and after the procedure. Patient was prepped and draped in the usual sterile fashion. The correct patient, procedure, and site was verified.   Injection Procedure Details:  Procedure Site One Meds Administered:  Meds ordered this encounter  Medications  . betamethasone acetate-betamethasone sodium phosphate (CELESTONE) injection 12 mg    Laterality: Right  Location/Site:  L5-S1 S1-2  Needle size: 22 G  Needle type: Spinal  Needle Placement: Transforaminal  Findings:    -Comments: Excellent flow of contrast along the nerve and into the epidural space.  Procedure Details: After squaring off the end-plates to get a true AP view, the C-arm was positioned so that an oblique view of the foramen as noted above was visualized. The target area is just inferior to the "nose of the scotty dog" or sub pedicular. The soft tissues overlying this structure were infiltrated with 2-3 ml. of 1% Lidocaine without Epinephrine.  The spinal needle was inserted toward the target using a "trajectory" view along the fluoroscope beam.  Under AP and lateral visualization, the needle was advanced so it did not puncture dura and was located close the 6 O'Clock position of the pedical in AP tracterory. Biplanar projections were used to confirm position. Aspiration was confirmed to be negative for CSF and/or blood. A 1-2 ml. volume of Isovue-250 was injected and flow of contrast was noted at each level. Radiographs were obtained for documentation purposes.   After attaining the desired flow of contrast documented above, a  0.5 to 1.0 ml test dose of 0.25% Marcaine was injected into each respective transforaminal space.  The patient was observed for 90 seconds post injection.  After no sensory deficits were reported, and normal lower extremity motor function was noted,   the above injectate was administered so that equal amounts of the injectate were placed at each foramen (level) into the transforaminal epidural space.   Additional Comments:  The patient tolerated the procedure well Dressing: 2 x 2 sterile gauze and Band-Aid    Post-procedure details: Patient was observed during the procedure. Post-procedure instructions were reviewed.  Patient left the clinic in stable condition.

## 2019-04-09 NOTE — Progress Notes (Signed)
 .  Numeric Pain Rating Scale and Functional Assessment Average Pain 7   In the last MONTH (on 0-10 scale) has pain interfered with the following?  1. General activity like being  able to carry out your everyday physical activities such as walking, climbing stairs, carrying groceries, or moving a chair?  Rating(6)   +Driver, -BT, -Dye Allergies.  

## 2019-04-09 NOTE — Progress Notes (Signed)
Edward Mcgee - 63 y.o. male MRN [id number]  Date of birth: 1955-10-19  Office Visit Note: Visit Date: 04/09/2019 PCP: Anne NgNche, Charlotte Lum, NP Referred by: Anne NgNche, Charlotte Lum, NP  Subjective: Chief Complaint  Patient presents with  . Lower Back - Pain  . Right Leg - Pain   HPI:  Edward Mcgee is a 63 y.o. male who comes in today For planned right L5 and S1 transforaminal epidural steroid injection diagnostically hopefully therapeutically.  Patient is having low back and right hip and leg pain particularly right lower leg pain below the knee and more of an L5-S1 distribution.  Nothing really foot.  No focal weakness.  He has had physical therapy and medication management without continued relief and this is been ongoing now for quite some time and progressively seems to worsen.  He has had mild overall relief since it started.  He denies any left-sided complaints.  No prior back surgery.  MRI of the lumbar spine reviewed today with central disc protrusion at L5-S1 is 1 but also with lateral recess narrowing at L4-5 while that is causing his symptoms.  ROS Otherwise per HPI.  Assessment & Plan: Visit Diagnoses:  1. Lumbar radiculopathy     Plan: No additional findings.   Meds & Orders:  Meds ordered this encounter  Medications  . betamethasone acetate-betamethasone sodium phosphate (CELESTONE) injection 12 mg    Orders Placed This Encounter  Procedures  . XR C-ARM NO REPORT  . Epidural Steroid injection    Follow-up: Return if symptoms worsen or fail to improve.   Procedures: No procedures performed  Lumbosacral Transforaminal Epidural Steroid Injection - Sub-Pedicular Approach with Fluoroscopic Guidance  Patient: Edward Mcgee      Date of Birth: 1955-10-19 MRN: [id number] PCP: Anne NgNche, Charlotte Lum, NP      Visit Date: 04/09/2019   Universal Protocol:    Date/Time: 04/09/2019  Consent Given By: the patient  Position: PRONE  Additional Comments: Vital signs were  monitored before and after the procedure. Patient was prepped and draped in the usual sterile fashion. The correct patient, procedure, and site was verified.   Injection Procedure Details:  Procedure Site One Meds Administered:  Meds ordered this encounter  Medications  . betamethasone acetate-betamethasone sodium phosphate (CELESTONE) injection 12 mg    Laterality: Right  Location/Site:  L5-S1 S1-2  Needle size: 22 G  Needle type: Spinal  Needle Placement: Transforaminal  Findings:    -Comments: Excellent flow of contrast along the nerve and into the epidural space.  Procedure Details: After squaring off the end-plates to get a true AP view, the C-arm was positioned so that an oblique view of the foramen as noted above was visualized. The target area is just inferior to the "nose of the scotty dog" or sub pedicular. The soft tissues overlying this structure were infiltrated with 2-3 ml. of 1% Lidocaine without Epinephrine.  The spinal needle was inserted toward the target using a "trajectory" view along the fluoroscope beam.  Under AP and lateral visualization, the needle was advanced so it did not puncture dura and was located close the 6 O'Clock position of the pedical in AP tracterory. Biplanar projections were used to confirm position. Aspiration was confirmed to be negative for CSF and/or blood. A 1-2 ml. volume of Isovue-250 was injected and flow of contrast was noted at each level. Radiographs were obtained for documentation purposes.   After attaining the desired flow of contrast documented above, a 0.5 to 1.0 ml test  dose of 0.25% Marcaine was injected into each respective transforaminal space.  The patient was observed for 90 seconds post injection.  After no sensory deficits were reported, and normal lower extremity motor function was noted,   the above injectate was administered so that equal amounts of the injectate were placed at each foramen (level) into the  transforaminal epidural space.   Additional Comments:  The patient tolerated the procedure well Dressing: 2 x 2 sterile gauze and Band-Aid    Post-procedure details: Patient was observed during the procedure. Post-procedure instructions were reviewed.  Patient left the clinic in stable condition.     Clinical History: MRI LUMBAR SPINE WITHOUT CONTRAST  TECHNIQUE: Multiplanar, multisequence MR imaging of the lumbar spine was performed. No intravenous contrast was administered.  COMPARISON:  None.  FINDINGS: Segmentation: There are 5 non-rib bearing lumbar type vertebral bodies with the last intervertebral disc space labeled as L5-S1.  Alignment:  There is straightening of the normal lumbar lordosis.  Vertebrae: The vertebral body heights are well maintained. No fracture, marrow edema,or pathologic marrow infiltration. There is disc height loss with vacuum phenomenon seen at L5-S1.  Conus medullaris and cauda equina: Conus extends to the T12 level. Conus and cauda equina appear normal. 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  Paraspinal and other soft tissues: The paraspinal soft tissues and visualized retroperitoneal structures are unremarkable. The sacroiliac joints are intact.  Disc levels:  T12-L1:  No significant canal or neural foraminal narrowing.  L1-L2: There is a broad-based disc bulge with facet arthrosis which causes moderate bilateral neural foraminal narrowing. There is mild central canal narrowing.  L2-L3: There is a broad-based disc bulge with facet arthrosis and ligamentum flavum hypertrophy. There is moderate bilateral neural foraminal narrowing. There is mild central canal stenosis.  L3-L4: There is a broad-based disc bulge with facet arthrosis and ligamentum flavum hypertrophy. Severe bilateral neural foraminal narrowing is seen. Mild central canal stenosis noted.  L4-L5: There is a  broad-based disc bulge with facet arthrosis and ligamentum flavum hypertrophy which causes severe bilateral neural foraminal narrowing. There is mild effacement anterior thecal sac.  L5-S1: There is a broad-based disc bulge with a central disc protrusion which contacts the bilateral descending S1 nerve roots without impingement. There is severe bilateral neural foraminal narrowing.  IMPRESSION: 1. 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 2. Lumbar spine spondylosis most notable at L5-S1 with a central disc protrusion which contacts the bilateral descending S1 nerve roots with severe bilateral neural foraminal narrowing. Also at L3-L4 and L4-L5 with severe bilateral neural foraminal narrowing and mild central canal stenosis.   Electronically Signed   By: Jonna Clark M.D.   On: 03/05/2019 15:12     Objective:  VS:  HT:    WT:   BMI:     BP:(!) 151/88  HR:85bpm  TEMP: ( )  RESP:  Physical Exam Constitutional:      General: He is not in acute distress.    Appearance: Normal appearance. He is not ill-appearing.  HENT:     Head: Normocephalic and atraumatic.     Right Ear: External ear normal.     Left Ear: External ear normal.  Eyes:     Extraocular Movements: Extraocular movements intact.  Cardiovascular:     Rate and Rhythm: Normal rate.     Pulses: Normal pulses.  Abdominal:     General: There is no distension.  Palpations: Abdomen is soft.  Musculoskeletal:        General: No tenderness or signs of injury.     Right lower leg: No edema.     Left lower leg: No edema.     Comments: Patient has good distal strength without clonus.  Skin:    Findings: No erythema or rash.  Neurological:     General: No focal deficit present.     Mental Status: He is alert and oriented to person, place, and time.     Sensory: No sensory deficit.     Motor: No weakness or abnormal muscle tone.      Coordination: Coordination normal.  Psychiatric:        Mood and Affect: Mood normal.        Behavior: Behavior normal.     Ortho Exam Imaging: No results found.

## 2019-04-24 ENCOUNTER — Other Ambulatory Visit: Payer: Self-pay

## 2019-04-24 ENCOUNTER — Encounter: Payer: Self-pay | Admitting: Nurse Practitioner

## 2019-04-24 ENCOUNTER — Ambulatory Visit (INDEPENDENT_AMBULATORY_CARE_PROVIDER_SITE_OTHER): Payer: BC Managed Care – PPO | Admitting: Nurse Practitioner

## 2019-04-24 VITALS — BP 148/88 | HR 73 | Temp 97.3°F | Ht 64.0 in | Wt 155.4 lb

## 2019-04-24 DIAGNOSIS — Z23 Encounter for immunization: Secondary | ICD-10-CM

## 2019-04-24 DIAGNOSIS — Z0001 Encounter for general adult medical examination with abnormal findings: Secondary | ICD-10-CM | POA: Diagnosis not present

## 2019-04-24 DIAGNOSIS — E782 Mixed hyperlipidemia: Secondary | ICD-10-CM

## 2019-04-24 DIAGNOSIS — Z1211 Encounter for screening for malignant neoplasm of colon: Secondary | ICD-10-CM

## 2019-04-24 DIAGNOSIS — E1141 Type 2 diabetes mellitus with diabetic mononeuropathy: Secondary | ICD-10-CM

## 2019-04-24 DIAGNOSIS — N401 Enlarged prostate with lower urinary tract symptoms: Secondary | ICD-10-CM | POA: Diagnosis not present

## 2019-04-24 DIAGNOSIS — E1165 Type 2 diabetes mellitus with hyperglycemia: Secondary | ICD-10-CM | POA: Diagnosis not present

## 2019-04-24 DIAGNOSIS — R3914 Feeling of incomplete bladder emptying: Secondary | ICD-10-CM | POA: Diagnosis not present

## 2019-04-24 LAB — PSA: PSA: 1.37 ng/mL (ref 0.10–4.00)

## 2019-04-24 LAB — LIPID PANEL
Cholesterol: 226 mg/dL — ABNORMAL HIGH (ref 0–200)
HDL: 67.1 mg/dL (ref >39.00)
LDL Cholesterol: 138 mg/dL — ABNORMAL HIGH (ref 0–99)
NonHDL: 159.08
Total CHOL/HDL Ratio: 3
Triglycerides: 103 mg/dL (ref 0.0–149.0)
VLDL: 20.6 mg/dL (ref 0.0–40.0)

## 2019-04-24 LAB — CBC WITH DIFFERENTIAL/PLATELET
Basophils Absolute: 0 10*3/uL (ref 0.0–0.1)
Basophils Relative: 0.6 % (ref 0.0–3.0)
Eosinophils Absolute: 0.2 10*3/uL (ref 0.0–0.7)
Eosinophils Relative: 2 % (ref 0.0–5.0)
HCT: 40.6 % (ref 39.0–52.0)
Hemoglobin: 13.2 g/dL (ref 13.0–17.0)
Lymphocytes Relative: 28.5 % (ref 12.0–46.0)
Lymphs Abs: 2.2 10*3/uL (ref 0.7–4.0)
MCHC: 32.5 g/dL (ref 30.0–36.0)
MCV: 84.9 fl (ref 78.0–100.0)
Monocytes Absolute: 0.4 10*3/uL (ref 0.1–1.0)
Monocytes Relative: 5.8 % (ref 3.0–12.0)
Neutro Abs: 4.8 10*3/uL (ref 1.4–7.7)
Neutrophils Relative %: 63.1 % (ref 43.0–77.0)
Platelets: 262 10*3/uL (ref 150.0–400.0)
RBC: 4.79 Mil/uL (ref 4.22–5.81)
RDW: 12.8 % (ref 11.5–15.5)
WBC: 7.6 10*3/uL (ref 4.0–10.5)

## 2019-04-24 LAB — COMPREHENSIVE METABOLIC PANEL
ALT: 21 U/L (ref 0–53)
AST: 18 U/L (ref 0–37)
Albumin: 4.3 g/dL (ref 3.5–5.2)
Alkaline Phosphatase: 63 U/L (ref 39–117)
BUN: 14 mg/dL (ref 6–23)
CO2: 29 mEq/L (ref 19–32)
Calcium: 9.4 mg/dL (ref 8.4–10.5)
Chloride: 102 mEq/L (ref 96–112)
Creatinine, Ser: 0.84 mg/dL (ref 0.40–1.50)
GFR: 91.81 mL/min (ref >60.00)
Glucose, Bld: 98 mg/dL (ref 70–99)
Potassium: 3.8 mEq/L (ref 3.5–5.1)
Sodium: 139 mEq/L (ref 135–145)
Total Bilirubin: 0.4 mg/dL (ref 0.2–1.2)
Total Protein: 6.6 g/dL (ref 6.0–8.3)

## 2019-04-24 LAB — HEMOGLOBIN A1C: Hgb A1c MFr Bld: 6.7 % — ABNORMAL HIGH (ref 4.6–6.5)

## 2019-04-24 LAB — IFOBT (OCCULT BLOOD): IFOBT: POSITIVE

## 2019-04-24 NOTE — Progress Notes (Signed)
Subjective:    Patient ID: Edward Mcgee, male    DOB: 12/10/1955, 63 y.o.   MRN: [id number]  Patient presents today for complete physical  Accompanied by his wife.  HPI He denies any acute complains.  Sexual History (orientation,birth control, marital status, STD):married, sexually active.  Depression/Suicide: Depression screen [facility] 2/9 04/24/2019 08/20/2018 06/25/2018 06/18/2017  Decreased Interest 0 0 0 0  Down, Depressed, Hopeless 0 0 0 0  PHQ - 2 Score 0 0 0 0   Vision:upcoming appt 05/2019  Dental:will schedule  Immunizations: (TDAP, Hep C screen, Pneumovax, Influenza, zoster)  Health Maintenance  Topic Date Due  . Eye exam for diabetics  05/23/2018  . HIV Screening  04/23/2020*  . Hemoglobin A1C  10/22/2019  . Urine Protein Check  01/21/2020  . Complete foot exam   04/23/2020  . Tetanus Vaccine  06/14/2024  . Colon Cancer Screening  06/25/2026  . Flu Shot  Completed  .  Hepatitis C: One time screening is recommended by Center for Disease Control  (CDC) for  adults born from [birth year] through 1965.   Completed  *Topic was postponed. The date shown is not the original due date.   Diet:low carb and low fat.  Weight:  Wt Readings from Last 3 Encounters:  04/24/19 155 lb 6.4 oz (70.5 kg)  01/30/19 147 lb 11.2 oz (67 kg)  01/21/19 147 lb 6.4 oz (66.9 kg)   Exercise:walking  Fall Risk: Fall Risk  04/24/2019 08/20/2018 06/25/2018 06/18/2017  Falls in the past year? 1 0 0 No  Number falls in past yr: 0 - - -  Comment 1 mo ago - - -  Injury with Fall? 1 - - -  Comment finger swelling on right hand - - -   Advanced Directive: Advanced Directives 02/11/2019  Does Patient Have a Medical Advance Directive? No  Would patient like information on creating a medical advance directive? No - Patient declined     Medications and allergies reviewed with patient and updated if appropriate.  Patient Active Problem List   Diagnosis Date Noted  . Atrophy of quadriceps femoris muscle  01/30/2019  . Gastroesophageal reflux disease with esophagitis 12/24/2017  . Groin rash 12/24/2017  . Benign prostatic hyperplasia with incomplete bladder emptying 10/23/2017  . Positive fecal occult blood test 10/23/2017  . Vocal process granuloma 03/01/2017  . Laryngopharyngeal reflux (LPR) 03/01/2017  . Seborrheic dermatitis of scalp 08/16/2016  . Muscle cramps 05/27/2015  . Leg pain, lateral, right 05/27/2015  . AV block, 1st degree 02/10/2013  . DM (diabetes mellitus) ([place]) 02/10/2013  . Hyperlipidemia 02/25/2007  . BACK PAIN, LUMBAR, CHRONIC 05/31/2006    Current Outpatient Medications on File Prior to Visit  Medication Sig Dispense Refill  . Cholecalciferol (VITAMIN D3 PO) Take by mouth.    . metFORMIN (GLUCOPHAGE) 500 MG tablet Take 1 tablet (500 mg total) by mouth 2 (two) times daily with a meal. 180 tablet 3  . Multiple Vitamin (MULTIVITAMIN) tablet Take 1 tablet by mouth daily.    Marland Kitchen omeprazole (PRILOSEC) 20 MG capsule Take 1 capsule by mouth once daily 30 capsule 2  . ONE TOUCH ULTRA TEST test strip USE 1 STRIP TO CHECK GLUCOSE BEFORE BREAKFAST AND AT BEDTIME 100 each 4  . ONETOUCH DELICA LANCETS FINE MISC     . blood glucose meter kit and supplies KIT Dispense based on patient and insurance preference. Use before breakfast and at bedtime. ICD E11.9 (Patient not taking: Reported on 01/30/2019) 1 each  0   Current Facility-Administered Medications on File Prior to Visit  Medication Dose Route Frequency Provider Last Rate Last Dose  . 0.9 %  sodium chloride infusion  500 mL Intravenous Continuous Milus Banister, MD        Past Medical History:  Diagnosis Date  . DM (diabetes mellitus), type 2, uncontrolled ([place]) 02/10/2013  . GERD (gastroesophageal reflux disease)     Past Surgical History:  Procedure Laterality Date  . NO PAST SURGERIES      Social History   Socioeconomic History  . Marital status: Married    Spouse name: Not on file  . Number of children: 3   . Years of education: 26  . Highest education level: Not on file  Occupational History  . Occupation: Maintenance  Social Needs  . Financial resource strain: Not on file  . Food insecurity    Worry: Not on file    Inability: Not on file  . Transportation needs    Medical: Not on file    Non-medical: Not on file  Tobacco Use  . Smoking status: Never Smoker  . Smokeless tobacco: Never Used  Substance and Sexual Activity  . Alcohol use: Yes    Comment: socially  . Drug use: No  . Sexual activity: Not on file  Lifestyle  . Physical activity    Days per week: Not on file    Minutes per session: Not on file  . Stress: Not on file  Relationships  . Social Herbalist on phone: Not on file    Gets together: Not on file    Attends religious service: Not on file    Active member of club or organization: Not on file    Attends meetings of clubs or organizations: Not on file    Relationship status: Not on file  Other Topics Concern  . Not on file  Social History Narrative  . Not on file    Family History  Problem Relation Age of Onset  . Breast cancer Sister   . Breast cancer Sister   . Uterine cancer Sister   . Diabetes Other   . Colon cancer Neg Hx   . Esophageal cancer Neg Hx   . Rectal cancer Neg Hx   . Stomach cancer Neg Hx   . Liver cancer Neg Hx         Review of Systems  Constitutional: Negative for fever, malaise/fatigue and weight loss.  HENT: Negative for congestion and sore throat.   Eyes:       Negative for visual changes  Respiratory: Negative for cough and shortness of breath.   Cardiovascular: Negative for chest pain, palpitations and leg swelling.  Gastrointestinal: Negative for blood in stool, constipation, diarrhea and heartburn.  Genitourinary: Negative for dysuria, frequency, hematuria and urgency.  Musculoskeletal: Negative for falls, joint pain and myalgias.  Skin: Negative for rash.  Neurological: Negative for dizziness, sensory  change and headaches.  Endo/Heme/Allergies: Does not bruise/bleed easily.  Psychiatric/Behavioral: Negative for depression, substance abuse and suicidal ideas. The patient is not nervous/anxious.    Objective:   Vitals:   04/24/19 1309  BP: (!) 148/88  Pulse: 73  Temp: (!) 97.3 F (36.3 C)  SpO2: 96%    Body mass index is 26.67 kg/m.   Physical Examination:  Physical Exam Vitals signs reviewed. Exam conducted with a chaperone present.  Constitutional:      General: He is not in acute distress.  Appearance: He is well-developed.  HENT:     Right Ear: Tympanic membrane, ear canal and external ear normal.     Left Ear: Tympanic membrane, ear canal and external ear normal.  Eyes:     Extraocular Movements: Extraocular movements intact.     Conjunctiva/sclera: Conjunctivae normal.  Neck:     Musculoskeletal: Normal range of motion and neck supple. No muscular tenderness.  Cardiovascular:     Rate and Rhythm: Normal rate and regular rhythm.     Pulses: Normal pulses.     Heart sounds: Normal heart sounds.  Pulmonary:     Effort: Pulmonary effort is normal. No respiratory distress.     Breath sounds: Normal breath sounds.  Chest:     Chest wall: No tenderness.  Abdominal:     General: Bowel sounds are normal.     Palpations: Abdomen is soft.  Genitourinary:    Prostate: Enlarged. Not tender and no nodules present.     Rectum: Guaiac result positive. External hemorrhoid present. No tenderness.  Musculoskeletal: Normal range of motion.     Right lower leg: No edema.     Left lower leg: No edema.  Lymphadenopathy:     Cervical: No cervical adenopathy.  Skin:    Findings: No erythema or rash.     Comments: Completed diabetic foot exam  Neurological:     Mental Status: He is alert and oriented to person, place, and time.     Deep Tendon Reflexes: Reflexes are normal and symmetric.  Psychiatric:        Mood and Affect: Mood normal.        Behavior: Behavior normal.         Thought Content: Thought content normal.    ASSESSMENT and PLAN:  Alba was seen today for annual exam.  Diagnoses and all orders for this visit:  Encounter for preventative adult health care exam with abnormal findings -     CBC with Differential/Platelet -     CMP -     IFOBT POC (occult bld, rslt in office)  Type 2 diabetes mellitus with diabetic mononeuropathy, without long-term current use of insulin (HCC) -     DM_1 Hemoglobin A1c  Benign prostatic hyperplasia with incomplete bladder emptying -     PSA -     tamsulosin (FLOMAX) 0.4 MG CAPS capsule; Take 1 capsule (0.4 mg total) by mouth daily.  Encounter for screening fecal occult blood testing -     IFOBT POC (occult bld, rslt in office) -     Fecal occult blood, imunochemical; Future  Mixed hyperlipidemia -     HTN_4 Lipid panel -     atorvastatin (LIPITOR) 20 MG tablet; Take 1 tablet (20 mg total) by mouth daily at 6 PM.  Need for influenza vaccination -     Flu Vaccine QUAD High Dose(Fluad)  Type 2 diabetes mellitus with hyperglycemia, without long-term current use of insulin (HCC) -     glipiZIDE (GLUCOTROL XL) 5 MG 24 hr tablet; Take 1 tablet (5 mg total) by mouth daily with breakfast.    DM (diabetes mellitus) ([place]) Educated about proper foot care. hgbA1c of 6.7 Maintain metformin and glipizide. Add lipitor 41m: LDL or 138, 28% cardiovascular risk. F/up 641monthfasting)  Hyperlipidemia Add lipitor 202mLDL or 138, 28% cardiovascular risk. F/up 56mo74monthsting)       Problem List Items Addressed This Visit      Endocrine   DM (diabetes mellitus) (HCC)[place] Educated  about proper foot care. hgbA1c of 6.7 Maintain metformin and glipizide. Add lipitor 69m: LDL or 138, 28% cardiovascular risk. F/up 692monthfasting)      Relevant Medications   atorvastatin (LIPITOR) 20 MG tablet   glipiZIDE (GLUCOTROL XL) 5 MG 24 hr tablet   Other Relevant Orders   DM_1 Hemoglobin A1c (Completed)      Genitourinary   Benign prostatic hyperplasia with incomplete bladder emptying   Relevant Medications   tamsulosin (FLOMAX) 0.4 MG CAPS capsule   Other Relevant Orders   PSA (Completed)     Other   Hyperlipidemia    Add lipitor 2033mLDL or 138, 28% cardiovascular risk. F/up 69mo5monthsting)       Relevant Medications   atorvastatin (LIPITOR) 20 MG tablet   Other Relevant Orders   HTN_4 Lipid panel (Completed)    Other Visit Diagnoses    Encounter for preventative adult health care exam with abnormal findings    -  Primary   Relevant Orders   CBC with Differential/Platelet (Completed)   CMP (Completed)   IFOBT POC (occult bld, rslt in office) (Completed)   Encounter for screening fecal occult blood testing       Relevant Orders   IFOBT POC (occult bld, rslt in office) (Completed)   Fecal occult blood, imunochemical   Need for influenza vaccination       Relevant Orders   Flu Vaccine QUAD High Dose(Fluad) (Completed)       Follow up: Return in about 6 months (around 10/22/2019) for DM and HTN, hyperlipidemia (fasting).  CharWilfred Lacy

## 2019-04-24 NOTE — Patient Instructions (Addendum)
Normal CMP, PSA, and cbc. Improved hgbA1c: 9.2 to 6.7, maintain metformin and glipizide. Abnormal lipid panel: elevated TC and LDL. This increases your risk for cardiovascular disease over the next 57yrs: 28.7% I sent lipitor rx.  F/up in 104months (fasting)  Collect envelop for stool test. Return envelop to lab as soon as possible.   Health Maintenance, Male Adopting a healthy lifestyle and getting preventive care are important in promoting health and wellness. Ask your health care provider about:  The right schedule for you to have regular tests and exams.  Things you can do on your own to prevent diseases and keep yourself healthy. What should I know about diet, weight, and exercise? Eat a healthy diet   Eat a diet that includes plenty of vegetables, fruits, low-fat dairy products, and lean protein.  Do not eat a lot of foods that are high in solid fats, added sugars, or sodium. Maintain a healthy weight Body mass index (BMI) is a measurement that can be used to identify possible weight problems. It estimates body fat based on height and weight. Your health care provider can help determine your BMI and help you achieve or maintain a healthy weight. Get regular exercise Get regular exercise. This is one of the most important things you can do for your health. Most adults should:  Exercise for at least 150 minutes each week. The exercise should increase your heart rate and make you sweat (moderate-intensity exercise).  Do strengthening exercises at least twice a week. This is in addition to the moderate-intensity exercise.  Spend less time sitting. Even light physical activity can be beneficial. Watch cholesterol and blood lipids Have your blood tested for lipids and cholesterol at 64 years of age, then have this test every 5 years. You may need to have your cholesterol levels checked more often if:  Your lipid or cholesterol levels are high.  You are older than 64 years of age.   You are at high risk for heart disease. What should I know about cancer screening? Many types of cancers can be detected early and may often be prevented. Depending on your health history and family history, you may need to have cancer screening at various ages. This may include screening for:  Colorectal cancer.  Prostate cancer.  Skin cancer.  Lung cancer. What should I know about heart disease, diabetes, and high blood pressure? Blood pressure and heart disease  High blood pressure causes heart disease and increases the risk of stroke. This is more likely to develop in people who have high blood pressure readings, are of African descent, or are overweight.  Talk with your health care provider about your target blood pressure readings.  Have your blood pressure checked: ? Every 3-5 years if you are 69-40 years of age. ? Every year if you are 36 years old or older.  If you are between the ages of 14 and 41 and are a current or former smoker, ask your health care provider if you should have a one-time screening for abdominal aortic aneurysm (AAA). Diabetes Have regular diabetes screenings. This checks your fasting blood sugar level. Have the screening done:  Once every three years after age 85 if you are at a normal weight and have a low risk for diabetes.  More often and at a younger age if you are overweight or have a high risk for diabetes. What should I know about preventing infection? Hepatitis B If you have a higher risk for hepatitis B, you  should be screened for this virus. Talk with your health care provider to find out if you are at risk for hepatitis B infection. Hepatitis C Blood testing is recommended for:  Everyone born from [birth year] through 1965.  Anyone with known risk factors for hepatitis C. Sexually transmitted infections (STIs)  You should be screened each year for STIs, including gonorrhea and chlamydia, if: ? You are sexually active and are younger than  64 years of age. ? You are older than 64 years of age and your health care provider tells you that you are at risk for this type of infection. ? Your sexual activity has changed since you were last screened, and you are at increased risk for chlamydia or gonorrhea. Ask your health care provider if you are at risk.  Ask your health care provider about whether you are at high risk for HIV. Your health care provider may recommend a prescription medicine to help prevent HIV infection. If you choose to take medicine to prevent HIV, you should first get tested for HIV. You should then be tested every 3 months for as long as you are taking the medicine. Follow these instructions at home: Lifestyle  Do not use any products that contain nicotine or tobacco, such as cigarettes, e-cigarettes, and chewing tobacco. If you need help quitting, ask your health care provider.  Do not use street drugs.  Do not share needles.  Ask your health care provider for help if you need support or information about quitting drugs. Alcohol use  Do not drink alcohol if your health care provider tells you not to drink.  If you drink alcohol: ? Limit how much you have to 0-2 drinks a day. ? Be aware of how much alcohol is in your drink. In the [place] one drink equals one 12 oz bottle of beer (355 mL), one 5 oz glass of wine (148 mL), or one 1 oz glass of hard liquor (44 mL). General instructions  Schedule regular health, dental, and eye exams.  Stay current with your vaccines.  Tell your health care provider if: ? You often feel depressed. ? You have ever been abused or do not feel safe at home. Summary  Adopting a healthy lifestyle and getting preventive care are important in promoting health and wellness.  Follow your health care provider's instructions about healthy diet, exercising, and getting tested or screened for diseases.  Follow your health care provider's instructions on monitoring your cholesterol  and blood pressure. This information is not intended to replace advice given to you by your health care provider. Make sure you discuss any questions you have with your health care provider. Document Released: 11/24/2007 Document Revised: 05/21/2018 Document Reviewed: 05/21/2018 Elsevier Patient Education  2020 [employer].

## 2019-04-25 ENCOUNTER — Encounter: Payer: Self-pay | Admitting: Nurse Practitioner

## 2019-04-25 MED ORDER — ATORVASTATIN CALCIUM 20 MG PO TABS: 20.0000 mg | ORAL_TABLET | Freq: Every day | ORAL | 1 refills | Status: DC

## 2019-04-25 MED ORDER — TAMSULOSIN HCL 0.4 MG PO CAPS: 0.4000 mg | ORAL_CAPSULE | Freq: Every day | ORAL | 3 refills | Status: DC

## 2019-04-25 MED ORDER — GLIPIZIDE ER 5 MG PO TB24: 5.0000 mg | ORAL_TABLET | Freq: Every day | ORAL | 1 refills | Status: DC

## 2019-04-25 NOTE — Assessment & Plan Note (Signed)
Add lipitor 20mg : LDL or 138, 28% cardiovascular risk. F/up 62months(fasting)

## 2019-04-25 NOTE — Assessment & Plan Note (Signed)
Educated about proper foot care. hgbA1c of 6.7 Maintain metformin and glipizide. Add lipitor 20mg : LDL or 138, 28% cardiovascular risk. F/up 44months(fasting)

## 2019-05-16 ENCOUNTER — Other Ambulatory Visit: Payer: Self-pay | Admitting: Nurse Practitioner

## 2019-05-16 DIAGNOSIS — K2100 Gastro-esophageal reflux disease with esophagitis, without bleeding: Secondary | ICD-10-CM

## 2019-05-16 DIAGNOSIS — R070 Pain in throat: Secondary | ICD-10-CM

## 2019-05-25 LAB — HM DIABETES EYE EXAM

## 2019-06-04 ENCOUNTER — Ambulatory Visit: Payer: HRSA Program | Attending: Internal Medicine

## 2019-06-04 ENCOUNTER — Other Ambulatory Visit: Payer: Self-pay

## 2019-06-04 DIAGNOSIS — U071 COVID-19: Secondary | ICD-10-CM | POA: Insufficient documentation

## 2019-06-04 DIAGNOSIS — Z20828 Contact with and (suspected) exposure to other viral communicable diseases: Secondary | ICD-10-CM | POA: Diagnosis present

## 2019-06-04 DIAGNOSIS — Z20822 Contact with and (suspected) exposure to covid-19: Secondary | ICD-10-CM

## 2019-06-06 LAB — NOVEL CORONAVIRUS, NAA: SARS-CoV-2, NAA: DETECTED — AB

## 2019-07-10 ENCOUNTER — Other Ambulatory Visit: Payer: Self-pay | Admitting: Nurse Practitioner

## 2019-07-20 IMAGING — US US SOFT TISSUE HEAD/NECK
1 series · 14 of 21 positions shown · non-contrast
Comparison: None.

CLINICAL DATA: Palpable left lateral neck abnormality

EXAM:
ULTRASOUND OF HEAD/NECK SOFT TISSUES
TECHNIQUE: Ultrasound examination of the head and neck soft tissues was
performed in the area of clinical concern. Per technologist, the
area of concern seemed somewhat migratory.

[Series 1: us soft tissue head/neck · 0.05mm/px · 21 acquisitions, 14 frames shown]
[im 1/21]
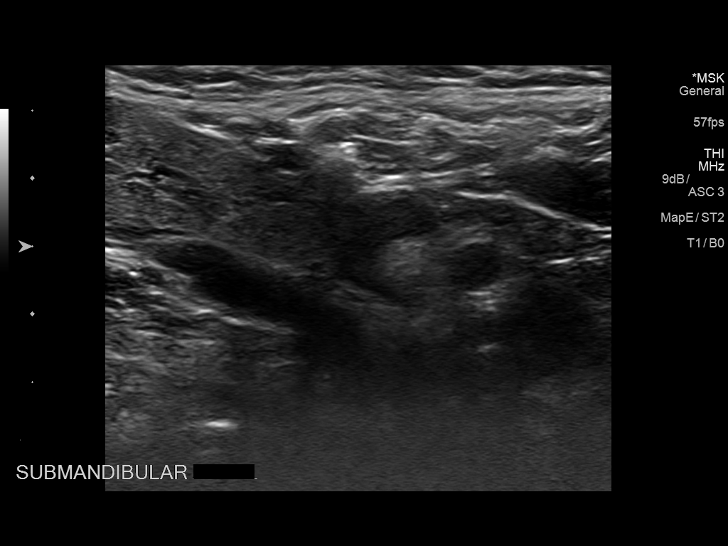
[im 3/21]
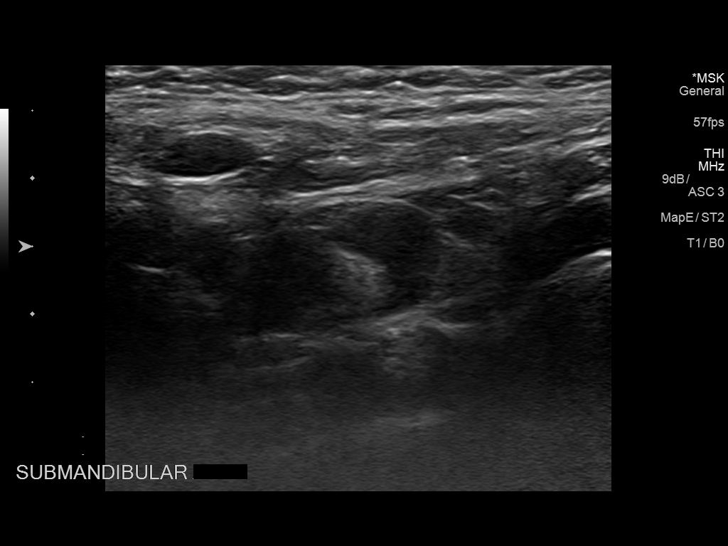
[im 4/21]
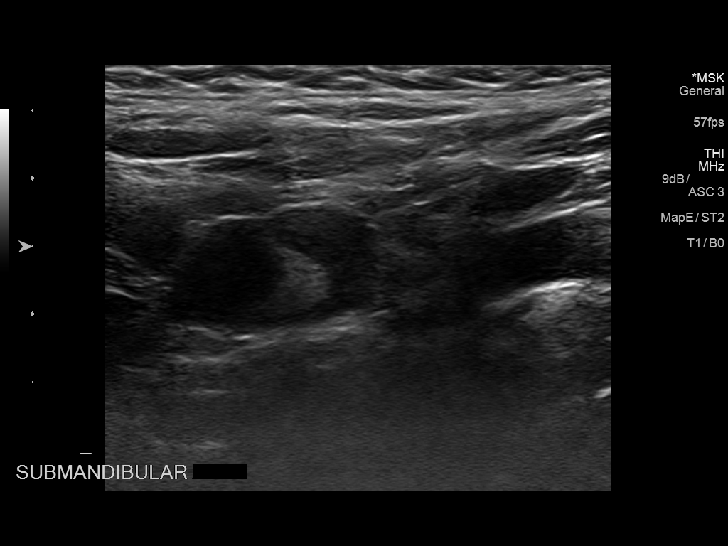
[im 6/21]
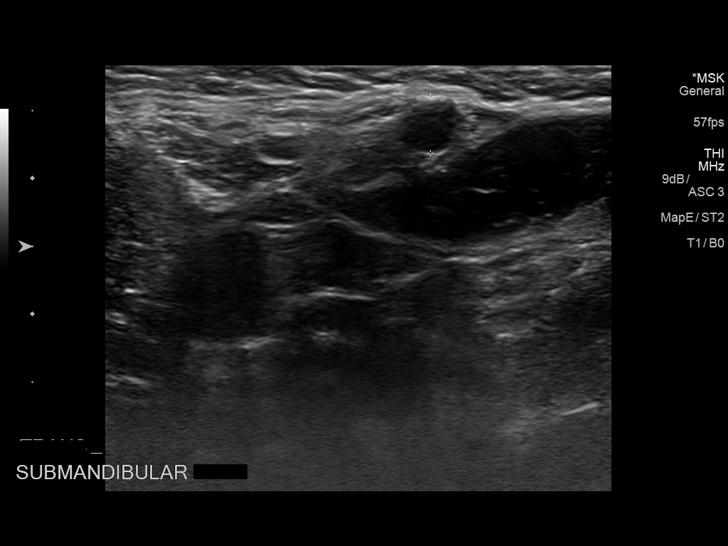
[im 7/21]
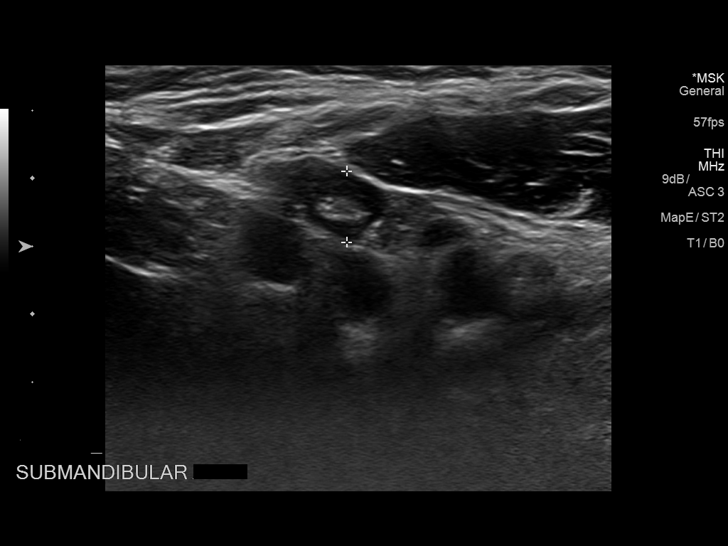
[im 9/21]
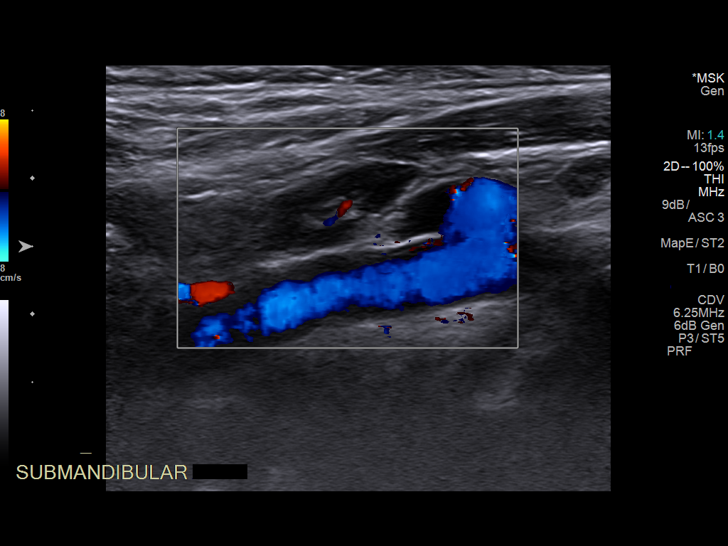
[im 10/21]
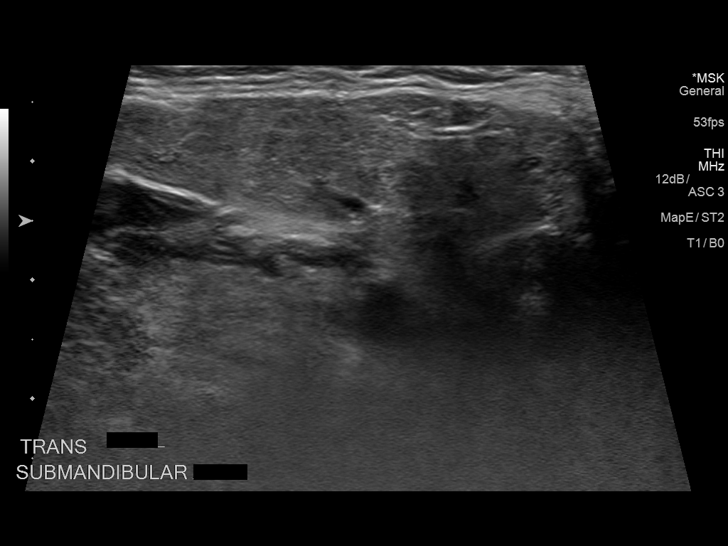
[im 12/21]
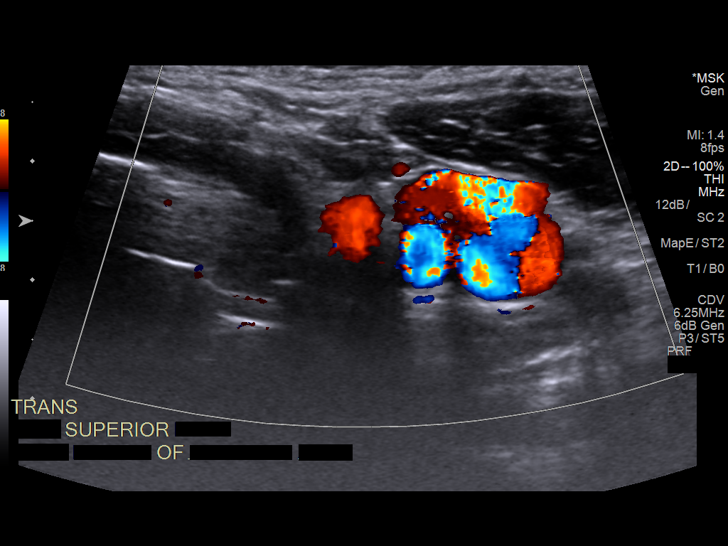
[im 13/21]
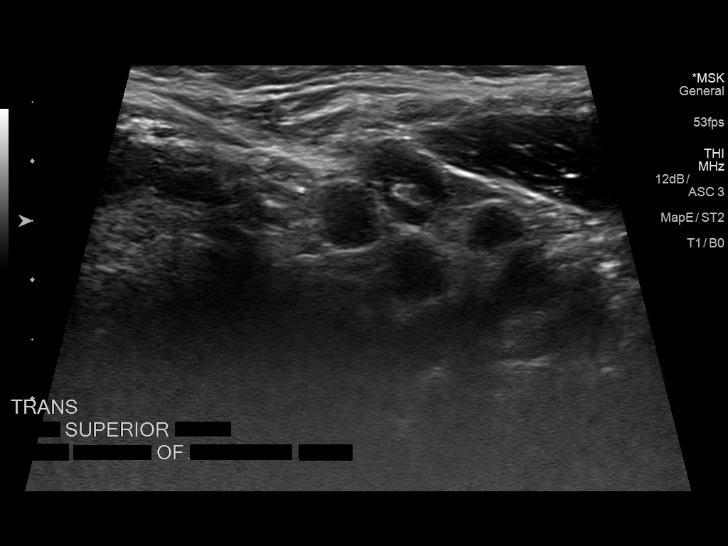
[im 15/21]
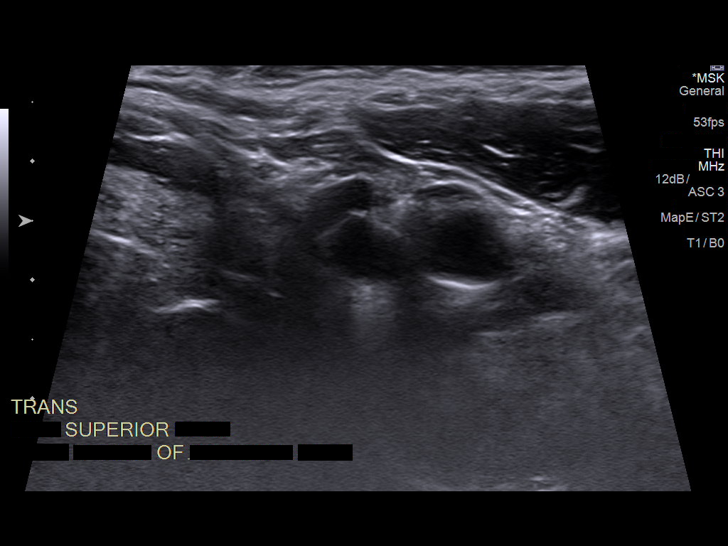
[im 16/21]
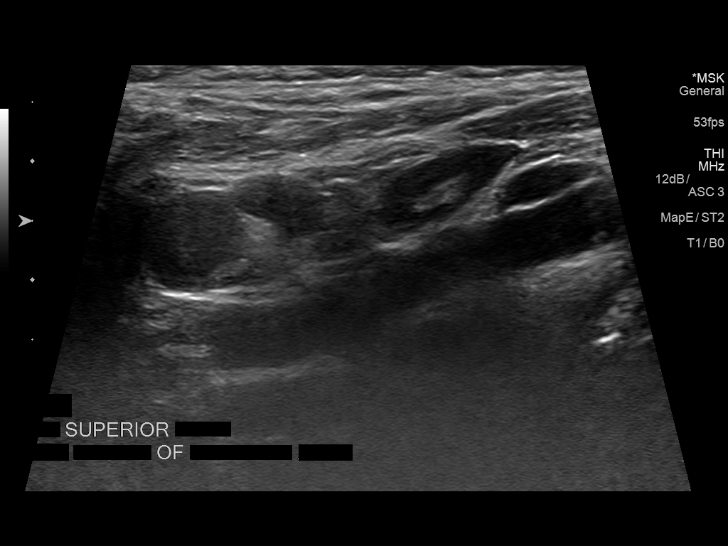
[im 18/21]
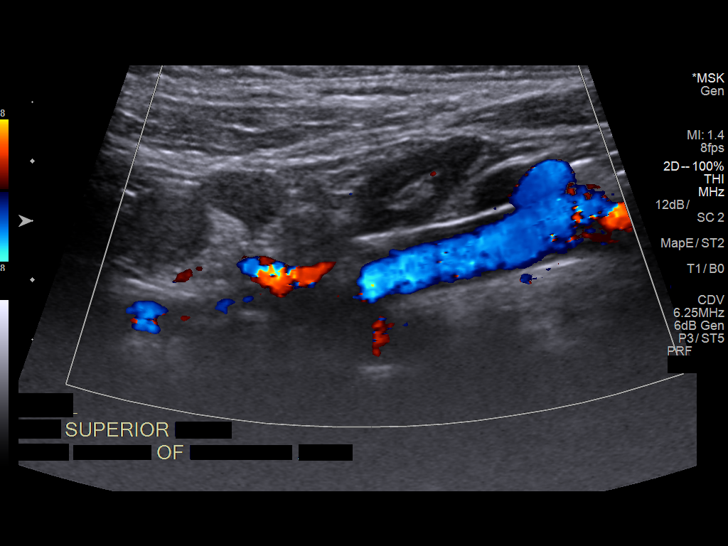
[im 19/21]
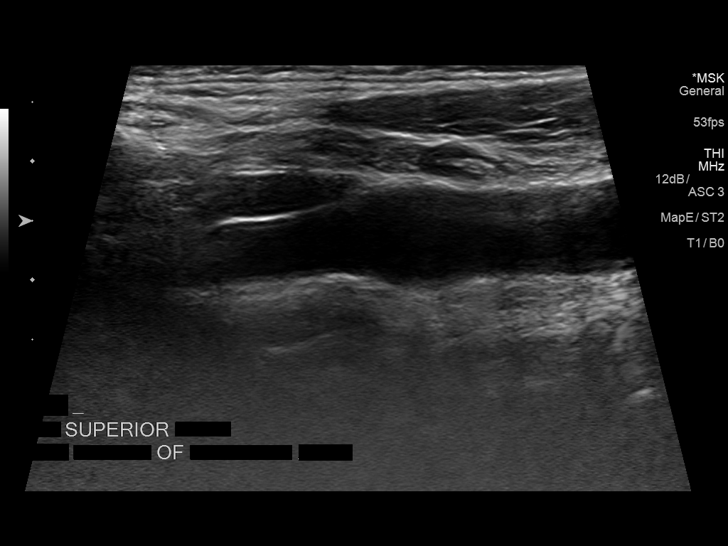
[im 21/21]
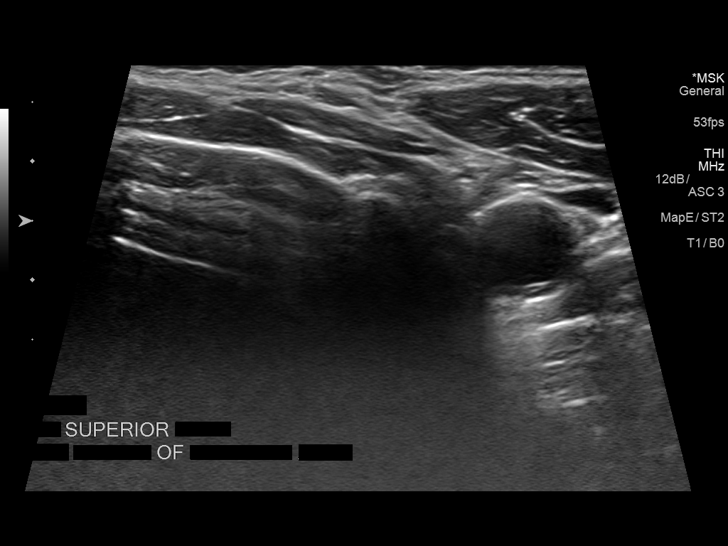

[14 of 21 positions shown; findings below may reference images not displayed]

FINDINGS: A number of morphologically unremarkable submandibular and cervical
lymph nodes are identified on the submitted images. None appear
pathologically enlarged, and all demonstrate normal fatty hilum. No
hyperemia. No evidence of mass, cyst, path adenopathy, abscess, or
aneurysm.
IMPRESSION: 1. No pathologic findings on ultrasound.

## 2019-07-27 ENCOUNTER — Encounter: Payer: Self-pay | Admitting: Nurse Practitioner

## 2019-07-27 ENCOUNTER — Other Ambulatory Visit: Payer: Self-pay

## 2019-07-27 ENCOUNTER — Ambulatory Visit (INDEPENDENT_AMBULATORY_CARE_PROVIDER_SITE_OTHER): Payer: BC Managed Care – PPO | Admitting: Nurse Practitioner

## 2019-07-27 VITALS — BP 132/80 | HR 72 | Temp 96.6°F | Ht 64.0 in | Wt 160.0 lb

## 2019-07-27 DIAGNOSIS — E1141 Type 2 diabetes mellitus with diabetic mononeuropathy: Secondary | ICD-10-CM

## 2019-07-27 DIAGNOSIS — E782 Mixed hyperlipidemia: Secondary | ICD-10-CM | POA: Diagnosis not present

## 2019-07-27 LAB — HEMOGLOBIN A1C: Hgb A1c MFr Bld: 6.6 % — ABNORMAL HIGH (ref 4.6–6.5)

## 2019-07-27 MED ORDER — ATORVASTATIN CALCIUM 20 MG PO TABS: 20.0000 mg | ORAL_TABLET | Freq: Every day | ORAL | 1 refills | Status: DC

## 2019-07-27 NOTE — Progress Notes (Signed)
Subjective:  Patient ID: Edward Mcgee, male    DOB: 1955/08/26  Age: 64 y.o. MRN: [id number]  CC: Follow-up (3 mo follow up--fasting--leg pain is coming back/ BS at home runs about 100s-120s/ BP at home runs 120s/70s. )   HPI HTN: BP at goal BP Readings from Last 3 Encounters:  07/27/19 132/80  04/24/19 (!) 148/88  04/09/19 (!) 151/88   DM: Glucose control with metformin and glipizide.  Hyperlipidemia: No joint or muscle pain with statin  Reviewed past Medical, Social and Family history today.  Outpatient Medications Prior to Visit  Medication Sig Dispense Refill  . Cholecalciferol (VITAMIN D3 PO) Take by mouth.    Marland Kitchen glipiZIDE (GLUCOTROL XL) 5 MG 24 hr tablet Take 1 tablet (5 mg total) by mouth daily with breakfast. 90 tablet 1  . metFORMIN (GLUCOPHAGE) 500 MG tablet Take 1 tablet (500 mg total) by mouth 2 (two) times daily with a meal. 180 tablet 3  . Multiple Vitamin (MULTIVITAMIN) tablet Take 1 tablet by mouth daily.    Marland Kitchen omeprazole (PRILOSEC) 20 MG capsule Take 1 capsule by mouth once daily 90 capsule 0  . ONETOUCH DELICA LANCETS FINE MISC     . ONETOUCH ULTRA test strip USE 1 STRIP TO CHECK GLUCOSE BEFORE BREAKFAST AND AT BEDTIME 100 each 0  . tamsulosin (FLOMAX) 0.4 MG CAPS capsule Take 1 capsule (0.4 mg total) by mouth daily. 90 capsule 3  . ZINC SULFATE OP Apply to eye.    . blood glucose meter kit and supplies KIT Dispense based on patient and insurance preference. Use before breakfast and at bedtime. ICD E11.9 (Patient not taking: Reported on 01/30/2019) 1 each 0  . atorvastatin (LIPITOR) 20 MG tablet Take 1 tablet (20 mg total) by mouth daily at 6 PM. (Patient not taking: Reported on 07/27/2019) 90 tablet 1   Facility-Administered Medications Prior to Visit  Medication Dose Route Frequency Provider Last Rate Last Admin  . 0.9 %  sodium chloride infusion  500 mL Intravenous Continuous Milus Banister, MD        ROS See HPI  Objective:  BP 132/80   Pulse 72    Temp (!) 96.6 F (35.9 C) (Tympanic)   Ht '5\' 4"'  (1.626 m)   Wt 160 lb (72.6 kg)   SpO2 95%   BMI 27.46 kg/m   BP Readings from Last 3 Encounters:  07/27/19 132/80  04/24/19 (!) 148/88  04/09/19 (!) 151/88    Wt Readings from Last 3 Encounters:  07/27/19 160 lb (72.6 kg)  04/24/19 155 lb 6.4 oz (70.5 kg)  01/30/19 147 lb 11.2 oz (67 kg)   Physical Exam Vitals reviewed.  Cardiovascular:     Rate and Rhythm: Normal rate and regular rhythm.     Pulses: Normal pulses.     Heart sounds: Normal heart sounds.  Pulmonary:     Effort: Pulmonary effort is normal.     Breath sounds: Normal breath sounds.  Musculoskeletal:     Right lower leg: No edema.     Left lower leg: No edema.  Neurological:     Mental Status: He is alert and oriented to person, place, and time.    Lab Results  Component Value Date   WBC 7.6 04/24/2019   HGB 13.2 04/24/2019   HCT 40.6 04/24/2019   PLT 262.0 04/24/2019   GLUCOSE 98 04/24/2019   CHOL 226 (H) 04/24/2019   TRIG 103.0 04/24/2019   HDL 67.10 04/24/2019   LDLCALC 138 (  H) 04/24/2019   ALT 21 04/24/2019   AST 18 04/24/2019   NA 139 04/24/2019   K 3.8 04/24/2019   CL 102 04/24/2019   CREATININE 0.84 04/24/2019   BUN 14 04/24/2019   CO2 29 04/24/2019   TSH 1.75 07/16/2017   PSA 1.37 04/24/2019   HGBA1C 6.6 (H) 07/27/2019   MICROALBUR <0.7 01/21/2019    Assessment & Plan:  This visit occurred during the SARS-CoV-2 public health emergency.  Safety protocols were in place, including screening questions prior to the visit, additional usage of staff PPE, and extensive cleaning of exam room while observing appropriate contact time as indicated for disinfecting solutions.   Maxx was seen today for follow-up.  Diagnoses and all orders for this visit:  Type 2 diabetes mellitus with diabetic mononeuropathy, without long-term current use of insulin (HCC) -     Hemoglobin A1c  Mixed hyperlipidemia -     atorvastatin (LIPITOR) 20 MG  tablet; Take 1 tablet (20 mg total) by mouth daily at 6 PM.   I am having Newell Barajas maintain his OneTouch Delica Lancets Fine, multivitamin, Cholecalciferol (VITAMIN D3 PO), blood glucose meter kit and supplies, metFORMIN, glipiZIDE, tamsulosin, omeprazole, OneTouch Ultra, ZINC SULFATE OP, and atorvastatin. We will continue to administer sodium chloride.  Meds ordered this encounter  Medications  . atorvastatin (LIPITOR) 20 MG tablet    Sig: Take 1 tablet (20 mg total) by mouth daily at 6 PM.    Dispense:  90 tablet    Refill:  1    Order Specific Question:   Supervising Provider    Answer:   Ronnald Nian [[id number]]    Problem List Items Addressed This Visit      Endocrine   DM (diabetes mellitus) ([place]) - Primary   Relevant Medications   atorvastatin (LIPITOR) 20 MG tablet   Other Relevant Orders   Hemoglobin A1c (Completed)     Other   Hyperlipidemia   Relevant Medications   atorvastatin (LIPITOR) 20 MG tablet       Follow-up: Return in about 6 months (around 01/24/2020) for DM and HTN, hyperlipidemia (fasting, F2F, 47mns).  CWilfred Lacy NP

## 2019-07-27 NOTE — Patient Instructions (Signed)
Go to lab for blood draw.  Start atorvastatin for elevated cholesterol.   Vrtigo posicional benigno Benign Positional Vertigo El vrtigo es la sensacin de que usted o todo lo que lo rodea se mueve cuando en realidad eso no sucede. El vrtigo posicional benigno es el tipo de vrtigo ms comn. Generalmente se trata de una enfermedad no daina (benigna). Esta afeccin es posicional. Esto significa que los sntomas son desencadenados por ciertos movimientos y posiciones. Esta afeccin puede ser peligrosa si ocurre mientras est haciendo algo que podra suponer un riesgo para usted o para los dems. Incluye actividades como conducir u operar [place]. Cules son las causas? En muchos casos, se desconoce la causa de esta afeccin. Puede deberse a Passenger transport manager zona del odo interno que ayuda al cerebro a percibir el movimiento y a Neurosurgeon equilibrio. Esta alteracin puede deberse a:  Una infeccin viral (laberintitis).  Lesiones en la cabeza.  Un movimiento repetitivo, como saltar, bailar o correr. Qu incrementa el riesgo? Es ms probable que contraiga esta afeccin si:  Es mujer.  Es mayor de 50aos. Cules son los signos o los sntomas? Generalmente, los sntomas de este trastorno se presentan al mover la cabeza o los ojos en diferentes direcciones. Los sntomas pueden aparecer repentinamente y suelen durar menos de un minuto. Incluyen los siguientes:  Prdida del equilibrio y cadas.  Sensacin de estar dando vueltas o movindose.  Sensacin de que el entorno est dando vueltas o movindose.  Nuseas y vmitos.  Visin borrosa.  Mareos.  Movimientos oculares involuntarios (nistagmo). Los sntomas pueden ser leves y solo causar problemas menores, o pueden ser graves e interferir en la vida cotidiana. Los episodios de vrtigo posicional benigno pueden repetirse (volver a Arts administrator) con el transcurso del tiempo. Los sntomas pueden mejorar con Physiological scientist. Cmo  se diagnostica? Esta afeccin se puede diagnosticar en funcin de lo siguiente:  Sus antecedentes mdicos.  Un examen fsico de la cabeza, el cuello y los odos.  Estudios, como, por ejemplo: ? Health visitor (RM). ? Exploracin por tomografa computarizada (TC). ? Estudios de los [employer]. El mdico puede pedirle que cambie rpidamente de posicin mientras observa si se presentan sntomas de vrtigo posicional benigno, por ejemplo, nistagmo. El movimiento ocular se puede estudiar con una variedad de exmenes que estn diseados para evaluar o estimular el vrtigo. ? Electroencefalograma (EEG). Este estudio registra la actividad elctrica del cerebro. ? Pruebas de audicin. Tal vez lo deriven a Land en problemas de la garganta, la nariz y el odo (otorrinolaringlogo), o a uno que se especializa en trastornos del sistema nervioso (neurlogo). Cmo se trata?  Esta afeccin se puede tratar en una sesin en la cual el mdico le pondr la cabeza en posiciones especficas para que el odo interno se normalice. El tratamiento de esta afeccin pueden llevar varias sesiones. [employer] casos son graves, tal vez haya que realizar una ciruga, pero esto no es frecuente. En algunos casos, el vrtigo posicional benigno se resuelve por s solo en el trmino de 2 o 4semanas. Siga estas indicaciones en su casa: Seguridad  Muvase lentamente. Evite algunas posiciones o determinados movimientos repentinos de la cabeza y el cuerpo como se lo haya indicado el mdico.  Evite conducir hasta que el mdico le diga que es seguro [place].  Evite operar maquinaria pesadas hasta que su mdico le diga que no corre riesgos.  No haga ninguna tarea que podra ser peligrosa para usted o para Producer, television/film/video en  caso de vrtigo.  Si tiene dificultad para caminar o mantener el equilibrio, use un bastn para Photographer estabilidad. Si se siente mareado o inestable, sintese de  inmediato.  Retome sus actividades normales como se lo haya indicado el mdico. Pregntele al mdico qu actividades son seguras para usted. Indicaciones generales  [employer] de venta libre y los recetados solamente como se lo haya indicado el mdico.  Beba suficiente lquido como para Pharmacologist la orina de color amarillo plido.  Concurra a todas las visitas de control como se lo haya indicado el mdico. Esto es importante. Comunquese con un mdico si:  Tiene fiebre.  Su afeccin empeora o presenta sntomas nuevos.  Sus familiares o amigos advierten cambios en su comportamiento.  Tiene nuseas o vmitos que empeoran.  Tiene sensacin de hormigueo o de adormecimiento. Solicite ayuda inmediatamente si:  Tiene dificultad para hablar o para moverse.  Esta mareado todo [employer].  Se desmaya.  Presenta dolores de cabeza intensos.  Tiene debilidad en los brazos o las piernas.  Presenta cambios en la audicin o la visin.  Presenta rigidez en el cuello.  Presenta sensibilidad a la luz. Resumen  El vrtigo es la sensacin de que usted o todo lo que lo rodea se mueve cuando en realidad eso no sucede. El vrtigo posicional benigno es el tipo de vrtigo ms comn.  Se desconoce la causa de esta afeccin. Puede deberse a Hotel manager zona del odo interno que ayuda al cerebro a percibir el movimiento y a Administrator, Civil Service equilibrio.  Los sntomas incluyen prdida del equilibrio y cadas, sensacin de que usted o su entorno se mueve, nuseas y vmitos, y visin borrosa.  Esta afeccin puede diagnosticarse en funcin de los sntomas, un examen fsico y otros estudios, como resonancia magntica (RM), exploracin por tomografa computarizada (TC), estudios del movimiento ocular y estudios de la audicin.  Siga las instrucciones de seguridad que le haya indicado el mdico. Tambin le dirn cundo debe ponerse en contacto con el mdico en caso de problemas. Esta  informacin no tiene Theme park manager el consejo del mdico. Asegrese de hacerle al mdico cualquier pregunta que tenga. Document Revised: 01/06/2018 Document Reviewed: 01/06/2018 Elsevier Patient Education  2020 [employer].

## 2019-07-30 NOTE — Assessment & Plan Note (Signed)
Controlled with hgbA1c at 6.6 Continue glipizide and metformin. F/up in 17months

## 2019-09-17 ENCOUNTER — Other Ambulatory Visit: Payer: Self-pay | Admitting: Nurse Practitioner

## 2019-09-17 DIAGNOSIS — E1141 Type 2 diabetes mellitus with diabetic mononeuropathy: Secondary | ICD-10-CM

## 2019-10-10 ENCOUNTER — Other Ambulatory Visit: Payer: Self-pay | Admitting: Nurse Practitioner

## 2019-10-10 DIAGNOSIS — K2100 Gastro-esophageal reflux disease with esophagitis, without bleeding: Secondary | ICD-10-CM

## 2019-10-10 DIAGNOSIS — R070 Pain in throat: Secondary | ICD-10-CM

## 2019-10-14 ENCOUNTER — Other Ambulatory Visit: Payer: Self-pay

## 2019-10-14 ENCOUNTER — Ambulatory Visit: Payer: BC Managed Care – PPO | Admitting: Family Medicine

## 2019-10-14 ENCOUNTER — Encounter: Payer: Self-pay | Admitting: Family Medicine

## 2019-10-14 DIAGNOSIS — M5441 Lumbago with sciatica, right side: Secondary | ICD-10-CM | POA: Diagnosis not present

## 2019-10-14 MED ORDER — HYDROCODONE-ACETAMINOPHEN 5-325 MG PO TABS: 1.0000 | ORAL_TABLET | Freq: Every evening | ORAL | 0 refills | Status: DC | PRN

## 2019-10-14 MED ORDER — BACLOFEN 10 MG PO TABS: 5.0000 mg | ORAL_TABLET | Freq: Every evening | ORAL | 3 refills | Status: DC | PRN

## 2019-10-14 NOTE — Progress Notes (Signed)
Office Visit Note   Patient: Edward Mcgee           Date of Birth: [birth year]           MRN: [id number] Visit Date: 10/14/2019 Requested by: Anne Ng, NP [address] [place],  [place] [address] PCP: Anne Ng, NP  Subjective: Chief Complaint  Patient presents with  . Right Lower Leg - Pain    Pain in lower lateral leg, mid-lower leg to above the knee. Started approximately 1 month ago, but has worsened over the last couple weeks. NKI. Hurts mainly in the evening. Difficulty sleeping last 3 nights due to pain.    HPI: He is here with recurrent right leg pain.  He has a history of lumbar radiculopathy with MRI showing severe left foraminal narrowing at L3-4, L4-5, and L5-S1.  Last fall he did physical therapy which did not help, then he had an epidural injection which completely eliminated his pain until about a month ago.  No injury, but he had a gradual recurrence of pain primarily mid shin on the lateral lower leg and up toward the knee.  He is able to tolerate pain during the day but cannot sleep at night.  No bowel or bladder dysfunction.  Interpreter was present today.              ROS:   All other systems were reviewed and are negative.  Objective: Vital Signs: There were no vitals taken for this visit.  Physical Exam:  General:  Alert and oriented, in no acute distress. Pulm:  Breathing unlabored. Psy:  Normal mood, congruent affect.  Right leg: Negative straight leg raise, no pain with internal hip rotation.  Lower extremity strength and reflexes remain normal.  No joint effusion in the knee, no significant tenderness to palpation around the knee.  Imaging: No results found.  Assessment & Plan: 1.  Recurrent right-sided sciatica -Discussed various options with him, elected to proceed with another epidural steroid injection.  Refilled hydrocodone to take at night for severe pain and he can try baclofen as well.  Follow-up as  needed.     Procedures: No procedures performed  No notes on file     PMFS History: Patient Active Problem List   Diagnosis Date Noted  . Atrophy of quadriceps femoris muscle 01/30/2019  . Gastroesophageal reflux disease with esophagitis 12/24/2017  . Groin rash 12/24/2017  . Benign prostatic hyperplasia with incomplete bladder emptying 10/23/2017  . Positive fecal occult blood test 10/23/2017  . Vocal process granuloma 03/01/2017  . Laryngopharyngeal reflux (LPR) 03/01/2017  . Seborrheic dermatitis of scalp 08/16/2016  . Muscle cramps 05/27/2015  . Leg pain, lateral, right 05/27/2015  . AV block, 1st degree 02/10/2013  . DM (diabetes mellitus) (HCC) 02/10/2013  . Hyperlipidemia 02/25/2007  . BACK PAIN, LUMBAR, CHRONIC 05/31/2006   Past Medical History:  Diagnosis Date  . DM (diabetes mellitus), type 2, uncontrolled (HCC) 02/10/2013  . GERD (gastroesophageal reflux disease)     Family History  Problem Relation Age of Onset  . Breast cancer Sister   . Breast cancer Sister   . Uterine cancer Sister   . Diabetes Other   . Colon cancer Neg Hx   . Esophageal cancer Neg Hx   . Rectal cancer Neg Hx   . Stomach cancer Neg Hx   . Liver cancer Neg Hx     Past Surgical History:  Procedure Laterality Date  . NO PAST SURGERIES  Social History   Occupational History  . Occupation: Maintenance  Tobacco Use  . Smoking status: Never Smoker  . Smokeless tobacco: Never Used  Substance and Sexual Activity  . Alcohol use: Yes    Comment: socially  . Drug use: No  . Sexual activity: Not on file

## 2019-10-23 ENCOUNTER — Telehealth: Payer: Self-pay | Admitting: Family Medicine

## 2019-10-23 MED ORDER — HYDROCODONE-ACETAMINOPHEN 5-325 MG PO TABS: 1.0000 | ORAL_TABLET | Freq: Every evening | ORAL | 0 refills | Status: DC | PRN

## 2019-10-23 NOTE — Telephone Encounter (Signed)
I called [name] back and advised her the refill has been sent in to the patient's pharmacy.

## 2019-10-23 NOTE — Telephone Encounter (Signed)
Sent!

## 2019-10-23 NOTE — Telephone Encounter (Signed)
Patient daughter [name] called requesting a refill of hydrocodone. Please send to pharmacy on file. Patient daughter phone number is [phone].

## 2019-10-23 NOTE — Telephone Encounter (Signed)
Please advise 

## 2019-10-30 ENCOUNTER — Telehealth: Payer: Self-pay | Admitting: Physical Medicine and Rehabilitation

## 2019-10-30 NOTE — Telephone Encounter (Signed)
[name] called and left a message on behalf of the patient stating that he is complaining of unbearable pain. She asked if we could see him before his appointment date of 5/26 or if he should go to the ED. Antony Blackbird returned her call on 5/20 to advise that we have no appointments before then and if his pain is that severe he should consider being seen in the ED. No answer and voicemail is not set up yet. I called back on 5/21 to advise the same. Again no answer and no voicemail.

## 2019-11-04 ENCOUNTER — Ambulatory Visit: Payer: Self-pay

## 2019-11-04 ENCOUNTER — Ambulatory Visit: Payer: BC Managed Care – PPO | Admitting: Physical Medicine and Rehabilitation

## 2019-11-04 ENCOUNTER — Encounter: Payer: Self-pay | Admitting: Physical Medicine and Rehabilitation

## 2019-11-04 ENCOUNTER — Other Ambulatory Visit: Payer: Self-pay

## 2019-11-04 VITALS — BP 129/72 | HR 81

## 2019-11-04 DIAGNOSIS — M5416 Radiculopathy, lumbar region: Secondary | ICD-10-CM | POA: Diagnosis not present

## 2019-11-04 MED ORDER — METHYLPREDNISOLONE ACETATE 80 MG/ML IJ SUSP
40.0000 mg | Freq: Once | INTRAMUSCULAR | Status: AC
  Administered 2019-11-04: 40 mg

## 2019-11-04 NOTE — Progress Notes (Signed)
Numeric Pain Rating Scale and Functional Assessment Average Pain 3   In the last MONTH (on 0-10 scale) has pain interfered with the following?  1. General activity like being  able to carry out your everyday physical activities such as walking, climbing stairs, carrying groceries, or moving a chair?  Rating(7)   +Driver, -BT, -Dye Allergies.  No pain on average. Pain when sitting. Intense pain when sleeping. Pain right knee to ankle, lateral aspect of leg.

## 2019-11-10 NOTE — Progress Notes (Signed)
Edward Mcgee - 64 y.o. male MRN [id number]  Date of birth: 1955/06/19  Office Visit Note: Visit Date: 11/04/2019 PCP: Anne Ng, NP Referred by: Anne Ng, NP  Subjective: Chief Complaint  Patient presents with  . Lower Back - Pain   HPI:  Edward Mcgee is a 64 y.o. male who comes in today At the request of Dr. Prince Rome for repeat for planned Right L5-S1 and S1-2 lumbar transforaminal epidural steroid injection with fluoroscopic guidance.  The patient has failed conservative care including home exercise, medications, time and activity modification.  This injection will be diagnostic and hopefully therapeutic.  Please see requesting physician notes for further details and justification.  Prior injection in October gave him almost 100% relief up until just recently.  He is having pretty classic right L5 and S1 symptoms down the leg to the ankle.  ROS Otherwise per HPI.  Assessment & Plan: Visit Diagnoses:  1. Lumbar radiculopathy     Plan: No additional findings.   Meds & Orders:  Meds ordered this encounter  Medications  . methylPREDNISolone acetate (DEPO-MEDROL) injection 40 mg    Orders Placed This Encounter  Procedures  . XR C-ARM NO REPORT  . Epidural Steroid injection    Follow-up: Return if symptoms worsen or fail to improve.   Procedures: No procedures performed  Lumbosacral Transforaminal Epidural Steroid Injection - Sub-Pedicular Approach with Fluoroscopic Guidance  Patient: Edward Mcgee      Date of Birth: 1956/01/21 MRN: [id number] PCP: Anne Ng, NP      Visit Date: 11/04/2019   Universal Protocol:    Date/Time: 11/04/2019  Consent Given By: the patient  Position: PRONE  Additional Comments: Vital signs were monitored before and after the procedure. Patient was prepped and draped in the usual sterile fashion. The correct patient, procedure, and site was verified.   Injection Procedure Details:  Procedure Site  One Meds Administered:  Meds ordered this encounter  Medications  . methylPREDNISolone acetate (DEPO-MEDROL) injection 40 mg    Laterality: Right  Location/Site:  L5-S1 S1-2  Needle size: 22 G  Needle type: Spinal  Needle Placement: Transforaminal  Findings:    -Comments: Excellent flow of contrast along the nerve and into the epidural space.  Procedure Details: After squaring off the end-plates to get a true AP view, the C-arm was positioned so that an oblique view of the foramen as noted above was visualized. The target area is just inferior to the "nose of the scotty dog" or sub pedicular. The soft tissues overlying this structure were infiltrated with 2-3 ml. of 1% Lidocaine without Epinephrine.  The spinal needle was inserted toward the target using a "trajectory" view along the fluoroscope beam.  Under AP and lateral visualization, the needle was advanced so it did not puncture dura and was located close the 6 O'Clock position of the pedical in AP tracterory. Biplanar projections were used to confirm position. Aspiration was confirmed to be negative for CSF and/or blood. A 1-2 ml. volume of Isovue-250 was injected and flow of contrast was noted at each level. Radiographs were obtained for documentation purposes.   After attaining the desired flow of contrast documented above, a 0.5 to 1.0 ml test dose of 0.25% Marcaine was injected into each respective transforaminal space.  The patient was observed for 90 seconds post injection.  After no sensory deficits were reported, and normal lower extremity motor function was noted,   the above injectate was administered so that equal amounts  of the injectate were placed at each foramen (level) into the transforaminal epidural space.   Additional Comments:  The patient tolerated the procedure well Dressing: 2 x 2 sterile gauze and Band-Aid    Post-procedure details: Patient was observed during the procedure. Post-procedure  instructions were reviewed.  Patient left the clinic in stable condition.      Clinical History: MRI LUMBAR SPINE WITHOUT CONTRAST  TECHNIQUE: Multiplanar, multisequence MR imaging of the lumbar spine was performed. No intravenous contrast was administered.  COMPARISON:  None.  FINDINGS: Segmentation: There are 5 non-rib bearing lumbar type vertebral bodies with the last intervertebral disc space labeled as L5-S1.  Alignment:  There is straightening of the normal lumbar lordosis.  Vertebrae: The vertebral body heights are well maintained. No fracture, marrow edema,or pathologic marrow infiltration. There is disc height loss with vacuum phenomenon seen at L5-S1.  Conus medullaris and cauda equina: Conus extends to the T12 level. Conus and cauda equina appear normal. 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  Paraspinal and other soft tissues: The paraspinal soft tissues and visualized retroperitoneal structures are unremarkable. The sacroiliac joints are intact.  Disc levels:  T12-L1:  No significant canal or neural foraminal narrowing.  L1-L2: There is a broad-based disc bulge with facet arthrosis which causes moderate bilateral neural foraminal narrowing. There is mild central canal narrowing.  L2-L3: There is a broad-based disc bulge with facet arthrosis and ligamentum flavum hypertrophy. There is moderate bilateral neural foraminal narrowing. There is mild central canal stenosis.  L3-L4: There is a broad-based disc bulge with facet arthrosis and ligamentum flavum hypertrophy. Severe bilateral neural foraminal narrowing is seen. Mild central canal stenosis noted.  L4-L5: There is a broad-based disc bulge with facet arthrosis and ligamentum flavum hypertrophy which causes severe bilateral neural foraminal narrowing. There is mild effacement anterior thecal sac.  L5-S1: There is a broad-based disc bulge with a  central disc protrusion which contacts the bilateral descending S1 nerve roots without impingement. There is severe bilateral neural foraminal narrowing.  IMPRESSION: 1. 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 2. Lumbar spine spondylosis most notable at L5-S1 with a central disc protrusion which contacts the bilateral descending S1 nerve roots with severe bilateral neural foraminal narrowing. Also at L3-L4 and L4-L5 with severe bilateral neural foraminal narrowing and mild central canal stenosis.   Electronically Signed   By: Prudencio Pair M.D.   On: 03/05/2019 15:12     Objective:  VS:  HT:    WT:   BMI:     BP:129/72  HR:81bpm  TEMP: ( )  RESP:  Physical Exam Constitutional:      General: He is not in acute distress.    Appearance: Normal appearance. He is not ill-appearing.  HENT:     Head: Normocephalic and atraumatic.     Right Ear: External ear normal.     Left Ear: External ear normal.  Eyes:     Extraocular Movements: Extraocular movements intact.  Cardiovascular:     Rate and Rhythm: Normal rate.     Pulses: Normal pulses.  Abdominal:     General: There is no distension.     Palpations: Abdomen is soft.  Musculoskeletal:        General: No tenderness or signs of injury.     Right lower leg: No edema.     Left lower leg: No edema.     Comments:  Patient has good distal strength without clonus.  Skin:    Findings: No erythema or rash.  Neurological:     General: No focal deficit present.     Mental Status: He is alert and oriented to person, place, and time.     Sensory: No sensory deficit.     Motor: No weakness or abnormal muscle tone.     Coordination: Coordination normal.  Psychiatric:        Mood and Affect: Mood normal.        Behavior: Behavior normal.      Imaging: No results found.

## 2019-11-10 NOTE — Procedures (Signed)
Lumbosacral Transforaminal Epidural Steroid Injection - Sub-Pedicular Approach with Fluoroscopic Guidance  Patient: Edward Mcgee      Date of Birth: 12/18/1955 MRN: [id number] PCP: Anne Ng, NP      Visit Date: 11/04/2019   Universal Protocol:    Date/Time: 11/04/2019  Consent Given By: the patient  Position: PRONE  Additional Comments: Vital signs were monitored before and after the procedure. Patient was prepped and draped in the usual sterile fashion. The correct patient, procedure, and site was verified.   Injection Procedure Details:  Procedure Site One Meds Administered:  Meds ordered this encounter  Medications  . methylPREDNISolone acetate (DEPO-MEDROL) injection 40 mg    Laterality: Right  Location/Site:  L5-S1 S1-2  Needle size: 22 G  Needle type: Spinal  Needle Placement: Transforaminal  Findings:    -Comments: Excellent flow of contrast along the nerve and into the epidural space.  Procedure Details: After squaring off the end-plates to get a true AP view, the C-arm was positioned so that an oblique view of the foramen as noted above was visualized. The target area is just inferior to the "nose of the scotty dog" or sub pedicular. The soft tissues overlying this structure were infiltrated with 2-3 ml. of 1% Lidocaine without Epinephrine.  The spinal needle was inserted toward the target using a "trajectory" view along the fluoroscope beam.  Under AP and lateral visualization, the needle was advanced so it did not puncture dura and was located close the 6 O'Clock position of the pedical in AP tracterory. Biplanar projections were used to confirm position. Aspiration was confirmed to be negative for CSF and/or blood. A 1-2 ml. volume of Isovue-250 was injected and flow of contrast was noted at each level. Radiographs were obtained for documentation purposes.   After attaining the desired flow of contrast documented above, a 0.5 to 1.0 ml test dose  of 0.25% Marcaine was injected into each respective transforaminal space.  The patient was observed for 90 seconds post injection.  After no sensory deficits were reported, and normal lower extremity motor function was noted,   the above injectate was administered so that equal amounts of the injectate were placed at each foramen (level) into the transforaminal epidural space.   Additional Comments:  The patient tolerated the procedure well Dressing: 2 x 2 sterile gauze and Band-Aid    Post-procedure details: Patient was observed during the procedure. Post-procedure instructions were reviewed.  Patient left the clinic in stable condition.

## 2019-12-23 ENCOUNTER — Other Ambulatory Visit: Payer: Self-pay

## 2019-12-23 ENCOUNTER — Other Ambulatory Visit: Payer: Self-pay | Admitting: Family Medicine

## 2019-12-23 ENCOUNTER — Other Ambulatory Visit: Payer: Self-pay | Admitting: Nurse Practitioner

## 2019-12-23 ENCOUNTER — Telehealth: Payer: Self-pay | Admitting: Family Medicine

## 2019-12-23 DIAGNOSIS — R070 Pain in throat: Secondary | ICD-10-CM

## 2019-12-23 DIAGNOSIS — G8929 Other chronic pain: Secondary | ICD-10-CM

## 2019-12-23 DIAGNOSIS — K2100 Gastro-esophageal reflux disease with esophagitis, without bleeding: Secondary | ICD-10-CM

## 2019-12-23 MED ORDER — HYDROCODONE-ACETAMINOPHEN 5-325 MG PO TABS: 1.0000 | ORAL_TABLET | Freq: Every evening | ORAL | 0 refills | Status: DC | PRN

## 2019-12-23 NOTE — Telephone Encounter (Signed)
I called and spoke with the patient's daughter, [name], explaining the plan and advising of the pain medication that was sent to the pharmacy.

## 2019-12-23 NOTE — Telephone Encounter (Signed)
Please advise 

## 2019-12-23 NOTE — Telephone Encounter (Signed)
Patient's daughter called.   She is requesting a call back to discuss what the patient's next step should be as the injection did not help him. They are also requesting he be given some pain medicine to help with the pain  Call back: [phone]

## 2019-12-23 NOTE — Telephone Encounter (Signed)
Rx sent.  Please schedule consult with JN for possible surgery.

## 2019-12-25 ENCOUNTER — Ambulatory Visit: Payer: BC Managed Care – PPO | Admitting: Specialist

## 2019-12-25 ENCOUNTER — Encounter: Payer: Self-pay | Admitting: Specialist

## 2019-12-25 ENCOUNTER — Other Ambulatory Visit: Payer: Self-pay

## 2019-12-25 VITALS — BP 161/91 | HR 97 | Ht 64.0 in | Wt 160.0 lb

## 2019-12-25 DIAGNOSIS — G5731 Lesion of lateral popliteal nerve, right lower limb: Secondary | ICD-10-CM

## 2019-12-25 DIAGNOSIS — M5136 Other intervertebral disc degeneration, lumbar region: Secondary | ICD-10-CM | POA: Diagnosis not present

## 2019-12-25 DIAGNOSIS — M25461 Effusion, right knee: Secondary | ICD-10-CM | POA: Diagnosis not present

## 2019-12-25 DIAGNOSIS — R2241 Localized swelling, mass and lump, right lower limb: Secondary | ICD-10-CM

## 2019-12-25 MED ORDER — GABAPENTIN 300 MG PO CAPS: 300.0000 mg | ORAL_CAPSULE | Freq: Every day | ORAL | 2 refills | Status: DC

## 2019-12-25 NOTE — Patient Instructions (Signed)
Avoid frequent bending and stooping  No lifting greater than 10 lbs. May use ice or moist heat for pain. Weight loss is of benefit. Best medication for lumbar disc disease is arthritis medications like motrin, celebrex and naprosyn. Exercise is important to improve your indurance and does allow people to function better inspite of back pain.  MRI right knee EMG/NCV right knee to assess for right leg entrapment of nerve with the area of swelling over the lateral right knee. Also allows assessment of the right side for nerve irritation from the back.

## 2019-12-25 NOTE — Progress Notes (Signed)
Office Visit Note   Patient: Edward Mcgee           Date of Birth: Mar 21, 1956           MRN: [id number] Visit Date: 12/25/2019              Requested by: Lavada Mesi, MD [address] [place],  [place] [address] PCP: Anne Ng, NP   Assessment & Plan: Visit Diagnoses:  1. Mass of right knee   2. Swelling of joint of right knee   3. Degenerative disc disease, lumbar   4. Peroneal neuropathy at knee, right     Plan: Avoid frequent bending and stooping  No lifting greater than 10 lbs. May use ice or moist heat for pain. Weight loss is of benefit. Best medication for lumbar disc disease is arthritis medications like motrin, celebrex and naprosyn. Exercise is important to improve your indurance and does allow people to function better inspite of back pain.  MRI right knee EMG/NCV right knee to assess for right leg entrapment of nerve with the area of swelling over the lateral right knee. Also allows assessment of the right side for nerve irritation from the back.  Follow-Up Instructions: Return in about 2 weeks (around 01/08/2020).   Orders:  Orders Placed This Encounter  Procedures  . MR Knee Right w/o contrast  . Ambulatory referral to Physical Medicine Rehab   Meds ordered this encounter  Medications  . gabapentin (NEURONTIN) 300 MG capsule    Sig: Take 1 capsule (300 mg total) by mouth at bedtime.    Dispense:  30 capsule    Refill:  2      Procedures: No procedures performed   Clinical Data: Findings:  CLINICAL DATA:  Right-sided lower back pain getting worse  EXAM: MRI LUMBAR SPINE WITHOUT CONTRAST  TECHNIQUE: Multiplanar, multisequence MR imaging of the lumbar spine was performed. No intravenous contrast was administered.  COMPARISON:  None.  FINDINGS: Segmentation: There are 5 non-rib bearing lumbar type vertebral bodies with the last intervertebral disc space labeled as L5-S1.  Alignment:  There is straightening of the normal  lumbar lordosis.  Vertebrae: The vertebral body heights are well maintained. No fracture, marrow edema,or pathologic marrow infiltration. There is disc height loss with vacuum phenomenon seen at L5-S1.  Conus medullaris and cauda equina: Conus extends to the T12 level. Conus and cauda equina appear normal. 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  Paraspinal and other soft tissues: The paraspinal soft tissues and visualized retroperitoneal structures are unremarkable. The sacroiliac joints are intact.  Disc levels:  T12-L1:  No significant canal or neural foraminal narrowing.  L1-L2: There is a broad-based disc bulge with facet arthrosis which causes moderate bilateral neural foraminal narrowing. There is mild central canal narrowing.  L2-L3: There is a broad-based disc bulge with facet arthrosis and ligamentum flavum hypertrophy. There is moderate bilateral neural foraminal narrowing. There is mild central canal stenosis.  L3-L4: There is a broad-based disc bulge with facet arthrosis and ligamentum flavum hypertrophy. Severe bilateral neural foraminal narrowing is seen. Mild central canal stenosis noted.  L4-L5: There is a broad-based disc bulge with facet arthrosis and ligamentum flavum hypertrophy which causes severe bilateral neural foraminal narrowing. There is mild effacement anterior thecal sac.  L5-S1: There is a broad-based disc bulge with a central disc protrusion which contacts the bilateral descending S1 nerve roots without impingement. There is severe bilateral neural foraminal narrowing.  IMPRESSION: 1. Straightening  of the normal lumbar lordosis with findings suggestive of mild congenital shortening of the pedicles. 2. Lumbar spine spondylosis most notable at L5-S1 with a central disc protrusion which contacts the bilateral descending S1 nerve roots with severe bilateral neural foraminal narrowing. Also  at L3-L4 and L4-L5 with severe bilateral neural foraminal narrowing and mild central canal stenosis.   Electronically Signed   By: Jonna Clark M.D.   On: 03/05/2019 15:12     Subjective: Chief Complaint  Patient presents with  . Lower Back - Pain    64 year old male with right leg pain worse with sitting and lying down improved with standing and walking. The pain has been present for near 1.5 years. The pain is over the lateral right knee and he is unable to fully bend the right knee. He works the knee with exercise. There is knee swelling present. 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 Speaks spanish and his interpeter today, Krystal Eaton with [facility] interpretation. He notices pain over the right lateral knee. The pain just came on for one year almost every day. There is pain with weight bearing. The right knee pops sometimes a lot other days no poppingl. Very little catching and popping. There is right lateral knee into the right lateral calf and foot sharp pain with pins and needle. The pain is present and makes it so can't sleep for last one month. He has had injections with Dr.Newton and these have only temporized the pain, first lasted 3 months and the last injection did not help at all. He has had ESIs no knee injections. He was told it is sciatic nerve.    Review of Systems  Constitutional: Positive for activity change. Negative for appetite change, chills, diaphoresis, fatigue, fever and unexpected weight change.  HENT: Negative.  Negative for congestion, dental problem, drooling, ear discharge, ear pain, facial swelling, hearing loss, mouth sores, nosebleeds, postnasal drip, rhinorrhea, sinus pressure, sinus pain, sneezing, sore throat, tinnitus, trouble swallowing and voice change.   Eyes: Negative.  Negative for photophobia, pain, discharge, redness, itching and visual disturbance.  Respiratory: Negative.  Negative  for apnea, cough, choking, chest tightness, shortness of breath, wheezing and stridor.   Cardiovascular: Negative.  Negative for chest pain, palpitations and leg swelling.  Gastrointestinal: Negative.  Negative for abdominal distention, abdominal pain, anal bleeding, blood in stool, constipation, diarrhea, nausea, rectal pain and vomiting.  Endocrine: Negative.  Negative for cold intolerance, heat intolerance, polydipsia, polyphagia and polyuria.  Genitourinary: Negative for difficulty urinating, dysuria, enuresis, flank pain, frequency and urgency.  Musculoskeletal: Positive for back pain, gait problem and joint swelling. Negative for arthralgias, myalgias, neck pain and neck stiffness.  Skin: Negative.  Negative for color change, pallor, rash and wound.  Allergic/Immunologic: Negative.  Negative for environmental allergies, food allergies and immunocompromised state.  Neurological: Positive for weakness and numbness. Negative for dizziness, tremors, seizures, syncope, facial asymmetry, speech difficulty, light-headedness and headaches.  Hematological: Negative.  Negative for adenopathy. Does not bruise/bleed easily.  Psychiatric/Behavioral: Negative.  Negative for agitation, behavioral problems, confusion, decreased concentration, dysphoric mood, hallucinations, self-injury, sleep disturbance and suicidal ideas. The patient is not nervous/anxious and is not hyperactive.      Objective: Vital Signs: BP (!) 161/91   Pulse 97   Ht 5\' 4"  (1.626 m)   Wt 160 lb (72.6 kg)   BMI 27.46 kg/m   Physical Exam Musculoskeletal:     Lumbar back: Positive right  straight leg raise test. Negative left straight leg raise test.     Right knee: No effusion.     Instability Tests: Lateral McMurray test positive.     Right Knee Exam   Tenderness  The patient is experiencing tenderness in the lateral joint line and lateral retinaculum.  Range of Motion  Extension:  -10 abnormal  Flexion:  120 abnormal    Tests  McMurray:  Lateral - positive Varus: positive Valgus: positive Lachman:  Anterior - negative    Posterior - negative Pivot shift: negative Patellar apprehension: negative  Other  Erythema: absent Sensation: normal Pulse: present Swelling: mild Effusion: no effusion present  Comments:  No effusion, swelling right lateral knee above the right fibula head. Positive Tinels sign right common peroneal nerve. There is a 4cm x 6 cm area of soft tissue mass over the lateral right knee, this is firm and rubbery   feeling.    Back Exam   Tenderness  The patient is experiencing tenderness in the lumbar.  Range of Motion  Extension: normal  Flexion: normal  Lateral bend right: normal  Lateral bend left: normal  Rotation right: normal  Rotation left: normal   Muscle Strength  Right Quadriceps:  5/5  Left Quadriceps:  5/5  Right Hamstrings:  5/5  Left Hamstrings:  5/5   Tests  Straight leg raise right: positive Straight leg raise left: negative  Reflexes  Patellar: 2/4 Achilles: 0/4 Biceps: 0/4  Other  Toe walk: normal Heel walk: normal Sensation: normal Gait: antalgic  Erythema: no back redness Scars: absent  Comments:  Right knee flexion contracture. Difficulty fully flexing the right knee.      Specialty Comments:  No specialty comments available.  Imaging: No results found.   PMFS History: Patient Active Problem List   Diagnosis Date Noted  . Atrophy of quadriceps femoris muscle 01/30/2019  . Gastroesophageal reflux disease with esophagitis 12/24/2017  . Groin rash 12/24/2017  . Benign prostatic hyperplasia with incomplete bladder emptying 10/23/2017  . Positive fecal occult blood test 10/23/2017  . Vocal process granuloma 03/01/2017  . Laryngopharyngeal reflux (LPR) 03/01/2017  . Seborrheic dermatitis of scalp 08/16/2016  . Muscle cramps 05/27/2015  . Leg pain, lateral, right 05/27/2015  . AV block, 1st degree 02/10/2013  . DM  (diabetes mellitus) (HCC) 02/10/2013  . Hyperlipidemia 02/25/2007  . BACK PAIN, LUMBAR, CHRONIC 05/31/2006   Past Medical History:  Diagnosis Date  . DM (diabetes mellitus), type 2, uncontrolled (HCC) 02/10/2013  . GERD (gastroesophageal reflux disease)     Family History  Problem Relation Age of Onset  . Breast cancer Sister   . Breast cancer Sister   . Uterine cancer Sister   . Diabetes Other   . Colon cancer Neg Hx   . Esophageal cancer Neg Hx   . Rectal cancer Neg Hx   . Stomach cancer Neg Hx   . Liver cancer Neg Hx     Past Surgical History:  Procedure Laterality Date  . NO PAST SURGERIES     Social History   Occupational History  . Occupation: Maintenance  Tobacco Use  . Smoking status: Never Smoker  . Smokeless tobacco: Never Used  Vaping Use  . Vaping Use: Never used  Substance and Sexual Activity  . Alcohol use: Yes    Comment: socially  . Drug use: No  . Sexual activity: Not on file

## 2019-12-29 ENCOUNTER — Encounter (HOSPITAL_COMMUNITY): Payer: Self-pay | Admitting: Emergency Medicine

## 2019-12-29 ENCOUNTER — Other Ambulatory Visit: Payer: Self-pay

## 2019-12-29 ENCOUNTER — Emergency Department (HOSPITAL_COMMUNITY)
Admission: EM | Admit: 2019-12-29 | Discharge: 2019-12-29 | Disposition: A | Discharge status: Home / Self Care | Payer: Medicare Other | Attending: Emergency Medicine | Admitting: Emergency Medicine

## 2019-12-29 DIAGNOSIS — Z8049 Family history of malignant neoplasm of other genital organs: Secondary | ICD-10-CM | POA: Diagnosis not present

## 2019-12-29 DIAGNOSIS — E119 Type 2 diabetes mellitus without complications: Secondary | ICD-10-CM | POA: Diagnosis not present

## 2019-12-29 DIAGNOSIS — Z91010 Allergy to peanuts: Secondary | ICD-10-CM | POA: Diagnosis not present

## 2019-12-29 DIAGNOSIS — Z79899 Other long term (current) drug therapy: Secondary | ICD-10-CM | POA: Insufficient documentation

## 2019-12-29 DIAGNOSIS — Z803 Family history of malignant neoplasm of breast: Secondary | ICD-10-CM | POA: Insufficient documentation

## 2019-12-29 DIAGNOSIS — M25561 Pain in right knee: Secondary | ICD-10-CM | POA: Insufficient documentation

## 2019-12-29 LAB — CBG MONITORING, ED: Glucose-Capillary: 148 mg/dL — ABNORMAL HIGH (ref 70–99)

## 2019-12-29 MED ORDER — KETOROLAC TROMETHAMINE 30 MG/ML IJ SOLN
30.0000 mg | Freq: Once | INTRAMUSCULAR | Status: AC
  Administered 2019-12-29: 30 mg via INTRAMUSCULAR
  Filled 2019-12-29: qty 1

## 2019-12-29 NOTE — ED Triage Notes (Signed)
Pt c/o right knee pain that goes down his leg. Pt recently seen at ortho for the same. Pt ordered to get MRI and follow up next week.

## 2019-12-29 NOTE — Discharge Instructions (Addendum)
Use ice and heat for comfort. Consider getting a knee sleeve at the pharmacy for comfort. Please call Dr Barbaraann Faster office to see if they can help you get a MRI done sooner. Take ibuprofen 400 mg 4 times a day for pain. You can take it with the hydrocodone and the gabapentin.    Use hielo y calor para su comodidad. Considere comprar una rodillera en la farmacia para mayor comodidad. Llame a la oficina del Dr. Otelia Sergeant para ver si pueden ayudarlo a realizarse una resonancia magntica antes. Tome ibuprofeno 400 mg 4 veces al da para Chief Technology Officer. Puede tomarlo con hidrocodona y gabapentina.

## 2019-12-29 NOTE — ED Provider Notes (Addendum)
[facility] EMERGENCY DEPARTMENT Provider Note   CSN: [id number] Arrival date & time: 12/29/19  0203   Time seen 2:26 AM  History Chief Complaint  Patient presents with  . Leg Pain    Edward Mcgee is a 64 y.o. male.  HPI Spanish interpreter Sharyn Lull [phone]  Patient states he has had right knee pain that radiates down to his ankle for at least a year.  He states he was diagnosed a year ago with sciatica and got 2 injections in his back and felt better for about 4 months.  The pain returned and he has had two more injections that have not helped.  The injections were all in his back.  He denies any back pain today.  He states his right knee hurts all the time.  He states it hurts a lot on the lateral aspect down the lateral part of his leg to his ankle.  He was seen by his orthopedist on July 16.  He has been on hydrocodone for about 3 months and gabapentin was added which he took at 17 PM.  He last took a hydrocodone at 2 PM.  He was on baclofen which he stopped 2 days ago.  Patient states she is scheduled to have an MRI of his knee however he cannot get an appointment for about a month.  Patient states he is here because the pain is getting worse.  He cannot tell me when it started getting worse or if there was a precipitating event.  PCP [name], [name], NP Ortho Dr Louanne Skye    Past Medical History:  Diagnosis Date  . DM (diabetes mellitus), type 2, uncontrolled ([place]) 02/10/2013  . GERD (gastroesophageal reflux disease)     Patient Active Problem List   Diagnosis Date Noted  . Atrophy of quadriceps femoris muscle 01/30/2019  . Gastroesophageal reflux disease with esophagitis 12/24/2017  . Groin rash 12/24/2017  . Benign prostatic hyperplasia with incomplete bladder emptying 10/23/2017  . Positive fecal occult blood test 10/23/2017  . Vocal process granuloma 03/01/2017  . Laryngopharyngeal reflux (LPR) 03/01/2017  . Seborrheic dermatitis of scalp 08/16/2016  . Muscle cramps  05/27/2015  . Leg pain, lateral, right 05/27/2015  . AV block, 1st degree 02/10/2013  . DM (diabetes mellitus) ([place]) 02/10/2013  . Hyperlipidemia 02/25/2007  . BACK PAIN, LUMBAR, CHRONIC 05/31/2006    Past Surgical History:  Procedure Laterality Date  . NO PAST SURGERIES         Family History  Problem Relation Age of Onset  . Breast cancer Sister   . Breast cancer Sister   . Uterine cancer Sister   . Diabetes Other   . Colon cancer Neg Hx   . Esophageal cancer Neg Hx   . Rectal cancer Neg Hx   . Stomach cancer Neg Hx   . Liver cancer Neg Hx     Social History   Tobacco Use  . Smoking status: Never Smoker  . Smokeless tobacco: Never Used  Vaping Use  . Vaping Use: Never used  Substance Use Topics  . Alcohol use: Yes    Comment: socially  . Drug use: No    Home Medications Prior to Admission medications   Medication Sig Start Date End Date Taking? Authorizing Provider  atorvastatin (LIPITOR) 20 MG tablet Take 1 tablet (20 mg total) by mouth daily at 6 PM. 07/27/19   [name], [name], NP  baclofen (LIORESAL) 10 MG tablet Take 0.5-1 tablets (5-10 mg total) by mouth at  bedtime as needed for muscle spasms. 10/14/19   Hilts, Legrand Como, MD  blood glucose meter kit and supplies KIT Dispense based on patient and insurance preference. Use before breakfast and at bedtime. ICD E11.9 Patient not taking: Reported on 01/30/2019 01/21/19   [name], [name], NP  chlorhexidine (PERIDEX) 0.12 % solution 15 mLs 2 (two) times daily. 10/02/19   [provider]  Cholecalciferol (VITAMIN D3 PO) Take by mouth.    [provider]  gabapentin (NEURONTIN) 300 MG capsule Take 1 capsule (300 mg total) by mouth at bedtime. 12/25/19   Jessy Oto, MD  glipiZIDE (GLUCOTROL XL) 5 MG 24 hr tablet Take 1 tablet (5 mg total) by mouth daily with breakfast. 04/25/19   [name], [name], NP  HYDROcodone-acetaminophen (NORCO/VICODIN) 5-325 MG tablet Take 1-2 tablets by mouth at  bedtime as needed for moderate pain. 12/23/19   Hilts, Legrand Como, MD  metFORMIN (GLUCOPHAGE) 500 MG tablet Take 1 tablet (500 mg total) by mouth 2 (two) times daily with a meal. 01/24/19   [name], [name], NP  Multiple Vitamin (MULTIVITAMIN) tablet Take 1 tablet by mouth daily.    [provider]  omeprazole (PRILOSEC) 20 MG capsule Take 1 capsule by mouth once daily 12/28/19   [name], [name], NP  [facility] DELICA LANCETS FINE MISC  07/16/17   [provider]  [facility] ULTRA test strip USE 1 STRIP TO CHECK GLUCOSE BEFORE BREAKFAST AND AT BEDTIME 09/17/19   [name], [name], NP  tamsulosin (FLOMAX) 0.4 MG CAPS capsule Take 1 capsule (0.4 mg total) by mouth daily. 04/25/19   [name], [name], NP  ZINC SULFATE OP Apply to eye.    [provider]    Allergies    Other and Peanut-containing drug products  Review of Systems   Review of Systems  All other systems reviewed and are negative.   Physical Exam Updated Vital Signs BP (!) 159/91   Pulse 86   Temp 98.7 F (37.1 C) (Oral)   Resp 18   Ht _0  (1.626 m)   Wt 73 kg   SpO2 99%   BMI 27.62 kg/m   Physical Exam Vitals and nursing note reviewed.  Constitutional:      Appearance: Normal appearance. He is normal weight.  HENT:     Head: Normocephalic and atraumatic.     Right Ear: External ear normal.     Left Ear: External ear normal.  Eyes:     Extraocular Movements: Extraocular movements intact.     Conjunctiva/sclera: Conjunctivae normal.     Pupils: Pupils are equal, round, and reactive to light.  Cardiovascular:     Rate and Rhythm: Normal rate and regular rhythm.  Pulmonary:     Effort: Pulmonary effort is normal. No respiratory distress.     Breath sounds: Normal breath sounds.  Musculoskeletal:        General: Swelling and tenderness present.     Cervical back: Normal range of motion.     Comments: Patient indicates he has trouble doing full flexion of his knee due to pain.  He does  appear to have some swelling around the right knee and possibly a small effusion.  There is no warmth to the knee, there is no skin inflammatory changes seen.  He indicates his pain is worse over the lateral joint space and continues down the lateral aspect of his lower leg towards the ankle.  The ankle is normal and nonswollen.  Skin:  General: Skin is warm and dry.     Findings: No erythema or rash.  Neurological:     General: No focal deficit present.     Mental Status: He is alert and oriented to person, place, and time.     Cranial Nerves: No cranial nerve deficit.  Psychiatric:        Mood and Affect: Affect is labile.     ED Results / Procedures / Treatments   Labs (all labs ordered are listed, but only abnormal results are displayed) Results for orders placed or performed during the hospital encounter of 12/29/19  CBG monitoring, ED  Result Value Ref Range   Glucose-Capillary 148 (H) 70 - 99 mg/dL   Laboratory interpretation all normal except nonfasting hyperglycemia    EKG None  Radiology No results found.  Procedures Procedures (including critical care time)  Medications Ordered in ED Medications  ketorolac (TORADOL) 30 MG/ML injection 30 mg (has no administration in time range)    ED Course  I have reviewed the triage vital signs and the nursing notes.  Pertinent labs & imaging results that were available during my care of the patient were reviewed by me and considered in my medical decision making (see chart for details).    MDM Rules/Calculators/A&P                         Patient states he is here for worsening pain and he cannot take it anymore. When I discussed with the patient that MRI is not available at night and even if he came in during the day it would not be considered an emergency to be done at which point he started taking his blood pressure cuff off and his pulse ox off.  He then said he would stay.  I offered him a pain shot which he  initially refused.  He then said he would take one.  CBG was done.  He has a mild nonfasting hyperglycemia.  When I review his prior lab work he had a A1c on February 15 of 6.6, on November 13 his BUN was 14 and creatinine was 0.84.  He was given Toradol IM.  He was advised to use ice and heat for comfort.  He can add ibuprofen which was in Dr. Otho Ket note for pain.  I also suggested that he call the office back and see if they could help him get a MRI done sooner than a month.  He can also get a knee sleeve at the pharmacy.  Patient had x-rays of his right knee done in February 02, 2019 at the orthopedist office.  Dr. Debara Pickett read the x-rays as showing mild tricompartmental degenerative change no sign of loose bodies.  In his note he describes patient has had back pain for 10 years.  And had recently started having pain in his right knee.  Patient had MRI of his lumbar spine done March 05, 2019.  He was noted to have lumbar spondylosis most notable L5-S1 with central disc protrusion which contacts the bilateral descending S1 nerve roots with severe bilateral neural foraminal narrowing.  Also at L3-L4 and L4-L5 with severe bilateral neural foraminal narrowing and mild central canal stenosis.   Final Clinical Impression(s) / ED Diagnoses Final diagnoses:  Acute pain of right knee    Rx / DC Orders ED Discharge Orders    None    OTC ibuprofen  Plan discharge  Rolland Porter, MD, Abram Sander  Rolland Porter, MD 12/29/19 [id number]    Rolland Porter, MD 12/29/19 [phone]

## 2019-12-31 ENCOUNTER — Other Ambulatory Visit: Payer: Self-pay

## 2019-12-31 ENCOUNTER — Ambulatory Visit (HOSPITAL_COMMUNITY)
Admission: RE | Admit: 2019-12-31 | Discharge: 2019-12-31 | Disposition: A | Discharge status: Home / Self Care | Payer: Medicare Other | Source: Ambulatory Visit | Attending: Specialist | Admitting: Specialist

## 2019-12-31 DIAGNOSIS — G5731 Lesion of lateral popliteal nerve, right lower limb: Secondary | ICD-10-CM | POA: Insufficient documentation

## 2019-12-31 DIAGNOSIS — R2241 Localized swelling, mass and lump, right lower limb: Secondary | ICD-10-CM

## 2019-12-31 DIAGNOSIS — M25461 Effusion, right knee: Secondary | ICD-10-CM | POA: Insufficient documentation

## 2019-12-31 MED ORDER — GADOBUTROL 1 MMOL/ML IV SOLN
8.0000 mL | Freq: Once | INTRAVENOUS | Status: AC | PRN
  Administered 2019-12-31: 8 mL via INTRAVENOUS

## 2020-01-05 ENCOUNTER — Encounter: Payer: Self-pay | Admitting: Physical Medicine and Rehabilitation

## 2020-01-05 ENCOUNTER — Ambulatory Visit (INDEPENDENT_AMBULATORY_CARE_PROVIDER_SITE_OTHER): Payer: Medicare Other | Admitting: Physical Medicine and Rehabilitation

## 2020-01-05 ENCOUNTER — Other Ambulatory Visit: Payer: Self-pay

## 2020-01-05 DIAGNOSIS — R202 Paresthesia of skin: Secondary | ICD-10-CM | POA: Diagnosis not present

## 2020-01-05 NOTE — Progress Notes (Signed)
Pain from right knee to lateral side of ankle. Pain for more than a year.   Numeric Pain Rating Scale and Functional Assessment Average Pain 8   In the last MONTH (on 0-10 scale) has pain interfered with the following?  1. General activity like being  able to carry out your everyday physical activities such as walking, climbing stairs, carrying groceries, or moving a chair?  Rating(6)

## 2020-01-06 ENCOUNTER — Ambulatory Visit (INDEPENDENT_AMBULATORY_CARE_PROVIDER_SITE_OTHER): Payer: Medicare Other | Admitting: Specialist

## 2020-01-06 ENCOUNTER — Encounter: Payer: Self-pay | Admitting: Physical Medicine and Rehabilitation

## 2020-01-06 ENCOUNTER — Encounter: Payer: Self-pay | Admitting: Specialist

## 2020-01-06 VITALS — BP 164/82 | HR 73 | Ht 64.0 in | Wt 160.0 lb

## 2020-01-06 DIAGNOSIS — S83281A Other tear of lateral meniscus, current injury, right knee, initial encounter: Secondary | ICD-10-CM | POA: Diagnosis not present

## 2020-01-06 DIAGNOSIS — S83241A Other tear of medial meniscus, current injury, right knee, initial encounter: Secondary | ICD-10-CM

## 2020-01-06 DIAGNOSIS — R2241 Localized swelling, mass and lump, right lower limb: Secondary | ICD-10-CM

## 2020-01-06 DIAGNOSIS — M25461 Effusion, right knee: Secondary | ICD-10-CM

## 2020-01-06 DIAGNOSIS — G5731 Lesion of lateral popliteal nerve, right lower limb: Secondary | ICD-10-CM | POA: Diagnosis not present

## 2020-01-06 MED ORDER — IBUPROFEN 800 MG PO TABS
800.0000 mg | ORAL_TABLET | Freq: Three times a day (TID) | ORAL | 0 refills | Status: DC | PRN
Start: 2020-01-06 — End: 2020-01-06

## 2020-01-06 MED ORDER — GABAPENTIN 300 MG PO CAPS: 300.0000 mg | ORAL_CAPSULE | Freq: Every day | ORAL | 2 refills | Status: DC

## 2020-01-06 MED ORDER — LIDOCAINE HCL 1 % IJ SOLN
1.0000 mL | INTRAMUSCULAR | Status: AC | PRN
  Administered 2020-01-06: 1 mL

## 2020-01-06 MED ORDER — HYDROCODONE-ACETAMINOPHEN 5-325 MG PO TABS: 1.0000 | ORAL_TABLET | Freq: Every evening | ORAL | 0 refills | Status: DC | PRN

## 2020-01-06 MED ORDER — METHYLPREDNISOLONE ACETATE 40 MG/ML IJ SUSP
40.0000 mg | INTRAMUSCULAR | Status: AC | PRN
  Administered 2020-01-06: 40 mg via INTRA_ARTICULAR

## 2020-01-06 MED ORDER — IBUPROFEN 800 MG PO TABS: 800.0000 mg | ORAL_TABLET | Freq: Three times a day (TID) | ORAL | 0 refills | Status: DC | PRN

## 2020-01-06 MED ORDER — BUPIVACAINE HCL 0.5 % IJ SOLN
3.0000 mL | INTRAMUSCULAR | Status: AC | PRN
  Administered 2020-01-06: 3 mL via INTRA_ARTICULAR

## 2020-01-06 NOTE — Procedures (Signed)
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  Clinically he seems to have some decrease in sensation but he has a normal superficial fibular sensory nerve action potential latency and amplitude.  There is likely some technical artifact with stimulation of the fibular nerve do to the anatomical changes laterally.    There is no significant electrodiagnostic evidence of any other focal nerve entrapment, lumbosacral plexopathy or lumbar radiculopathy. **As you know however this would not rule out a mild sensory only radiculopathy or radiculitis particularly L5.  Recommendations: 1.  Follow-up with referring physician. 2.  Continue current management of symptoms.  Correlate with the MRI and clinical situation.  ___________________________ Naaman Plummer FAAPMR Board Certified, American Board of Physical Medicine and Rehabilitation    Nerve Conduction Studies Anti Sensory Summary Table   Stim Site NR Peak (ms) Norm Peak (ms) P-T Amp (V) Norm P-T Amp Site1 Site2 Delta-P (ms) Dist (cm) Vel (m/s) Norm Vel (m/s)  Right Saphenous Anti Sensory (Ant Med Mall)  30.4C  14cm    4.0 <4.4 13.0 >2 14cm Ant Med Mall 4.0 0.0  >32  Right Sup Fibular Anti Sensory (Ant Lat Mall)  30.4C  14 cm    4.3 <4.4 7.7 >5.0 14 cm Ant Lat Mall 4.3 14.0 33 >32  Right Sural Anti Sensory (Lat Mall)  30.3C  Calf    4.0 <4.0 13.3 >5.0 Calf Lat Mall 4.0 14.0 35 >35   Motor Summary Table   Stim Site NR Onset (ms) Norm Onset (ms) O-P Amp (mV) Norm O-P Amp Site1 Site2 Delta-0 (ms) Dist (cm) Vel (m/s) Norm Vel (m/s)  Right Fibular Motor (Ext Dig Brev)  30.1C  Ankle    3.8 <6.1 4.7 >2.5 B Fib Ankle 7.1 29.0 41 >38  B Fib    10.9  3.9  Poplt B Fib 5.0 9.0 *18 >40  Poplt    15.9  0.0         Right Tibial Motor (Abd Hall Brev)  30.3C  Ankle     4.1 <6.1 12.7 >3.0 Knee Ankle 7.4 36.0 49 >35  Knee    11.5  0.1          EMG   Side Muscle Nerve Root Ins Act Fibs Psw Amp Dur Poly Recrt Int Dennie Bible Comment  Right AntTibialis Dp Br Peron L4-5 *Incr Nml Nml Nml Nml 0 Nml Nml   Right Fibularis Longus  Sup Br Peron L5-S1 *Incr *1+ *1+ Nml Nml 0 Nml Nml   Right MedGastroc Tibial S1-2 Nml Nml Nml Nml Nml 0 Nml Nml   Right VastusMed Femoral L2-4 Nml Nml Nml Nml Nml 0 Nml Nml   Right BicepsFemS Sciatic L5-S1 Nml Nml Nml Nml Nml 0 Nml Nml     Nerve Conduction Studies Anti Sensory Left/Right Comparison   Stim Site L Lat (ms) R Lat (ms) L-R Lat (ms) L Amp (V) R Amp (V) L-R Amp (%) Site1 Site2 L Vel (m/s) R Vel (m/s) L-R Vel (m/s)  Saphenous Anti Sensory (Ant Med Mall)  30.4C  14cm  4.0   13.0  14cm Ant Med Mall     Sup Fibular Anti Sensory (Ant Lat Mall)  30.4C  14 cm  4.3   7.7  14 cm Ant Lat Mall  33   Sural Anti Sensory (Lat Mall)  30.3C  Calf  4.0   13.3  Calf Lat Mall  35    Motor Left/Right Comparison   Stim Site L Lat (ms) R Lat (ms) L-R Lat (ms) L Amp (mV) R Amp (mV) L-R Amp (%) Site1 Site2 L Vel (m/s) R Vel (m/s) L-R Vel (m/s)  Fibular Motor (Ext Dig Brev)  30.1C  Ankle  3.8   4.7  B Fib Ankle  41   B Fib  10.9   3.9  Poplt B Fib  *18   Poplt  15.9   0.0        Tibial Motor (Abd Hall Brev)  30.3C  Ankle  4.1   12.7  Knee Ankle  49   Knee  11.5   0.1           Waveforms:

## 2020-01-06 NOTE — Patient Instructions (Addendum)
Plan: Knee is suffering from osteoarthritis, only real proven treatments are Weight loss, NSIADs like motrin and exercise. Well padded shoes help. Ice the knee that is suffering from osteoarthritis, only real proven treatments are Weight loss, NSIADs like diclofenac and exercise. Well padded shoes help. Ice the knee 2-3 times a day 15-20 mins at a time.-3 times a day 15-20 mins at a time. Hot showers in the AM.  Injection with steroid may be of benefit. Hemp CBD capsules, [email or web address] 5,000-7,000 mg per bottle, 60 capsules per bottle, take one capsule twice a day. Cane in the left hand to use with left leg weight bearing. Follow-Up Instructions: No follow-ups on file.  You have torn cartilage in the right knee and a torn ACL ligament. I am referring you to Dr. August Saucer for evaluation and consideration of treatment of the right knee and this may involve arthroscopy and decompression of the peroneal nerve at the right knee. I do not think lumbar surgery will be of any benefit.

## 2020-01-06 NOTE — Progress Notes (Signed)
Edward Mcgee - 64 y.o. male MRN [id number]  Date of birth: 01/27/1956  Office Visit Note: Visit Date: 01/05/2020 PCP: Anne Ng, NP Referred by: Anne Ng, NP  Subjective: Chief Complaint  Patient presents with  . Right Lower Leg - Pain   HPI: Edward Mcgee is a 64 y.o. male who comes in today At the request of Dr. Vira Browns for electrodiagnostic study of the right lower limb.  Patient's been having over a year of leg pain really from the right lateral knee to the ankle.  He denies much in the way of paresthesia does get tingling at times no left-sided complaints.  His initial history was that he saw Dr. Casimiro Needle hilts and had lumbar spine MRI and we completed right L5 and S1 transforaminal epidural steroid injection in the fall 2020 with actually outstanding results for quite a long time.  More recent injection was not as beneficial.  He was referred to Dr. Vira Browns who felt like maybe this was coming from the lateral aspect of the knee with some effect on the fibular or peroneal nerve.  MRI of the knee was performed and is reviewed below.  At least from an MRI image perspective, the radiologist felt like the peroneal nerve or fibular nerve looked normal.  There was quite a bit of issues with the knee itself including fluid collection laterally representing chronic tendinitis and there was also chronic ACL tear and cartilage tear.  Most of his symptoms are really from the knee down.  Some pain in the lower back at times but nothing really shooting down to the foot.  He describes some overall weakness but no foot drop.  ROS Otherwise per HPI.  Assessment & Plan: Visit Diagnoses:  1. 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  Clinically he seems to have  some decrease in sensation but he has a normal superficial fibular sensory nerve action potential latency and amplitude.  There is likely some technical artifact with stimulation of the fibular nerve do to the anatomical changes laterally.    There is no significant electrodiagnostic evidence of any other focal nerve entrapment, lumbosacral plexopathy or lumbar radiculopathy. **As you know however this would not rule out a mild sensory only radiculopathy or radiculitis particularly L5.  Recommendations: 1.  Follow-up with referring physician. 2.  Continue current management of symptoms.  Correlate with the MRI and clinical situation.  Meds & Orders: No orders of the defined types were placed in this encounter.   Orders Placed This Encounter  Procedures  . NCV with EMG (electromyography)    Follow-up: Return for Vira Browns, MD as scheduled.   Procedures: No procedures performed  Impression: The above electrodiagnostic study is ABNORMAL but somewhat difficult to interpret .The study reveals evidence of a moderate to severe right fibular nerve neuropathy at or above the knee affecting mainly motor components.  Clinically he seems to have some decrease in sensation but he has a normal superficial fibular sensory nerve action potential latency and amplitude.  There is likely some technical artifact with stimulation of the fibular nerve do to the anatomical changes laterally.    There is no significant electrodiagnostic evidence of any other focal nerve entrapment, lumbosacral plexopathy or lumbar radiculopathy. **As you know however this would not rule  out a mild sensory only radiculopathy or radiculitis particularly L5.  Recommendations: 1.  Follow-up with referring physician. 2.  Continue current management of symptoms.  Correlate with the MRI and clinical situation.  ___________________________ Naaman Plummer FAAPMR Board Certified, American Board of Physical Medicine and  Rehabilitation    Nerve Conduction Studies Anti Sensory Summary Table   Stim Site NR Peak (ms) Norm Peak (ms) P-T Amp (V) Norm P-T Amp Site1 Site2 Delta-P (ms) Dist (cm) Vel (m/s) Norm Vel (m/s)  Right Saphenous Anti Sensory (Ant Med Mall)  30.4C  14cm    4.0 <4.4 13.0 >2 14cm Ant Med Mall 4.0 0.0  >32  Right Sup Fibular Anti Sensory (Ant Lat Mall)  30.4C  14 cm    4.3 <4.4 7.7 >5.0 14 cm Ant Lat Mall 4.3 14.0 33 >32  Right Sural Anti Sensory (Lat Mall)  30.3C  Calf    4.0 <4.0 13.3 >5.0 Calf Lat Mall 4.0 14.0 35 >35   Motor Summary Table   Stim Site NR Onset (ms) Norm Onset (ms) O-P Amp (mV) Norm O-P Amp Site1 Site2 Delta-0 (ms) Dist (cm) Vel (m/s) Norm Vel (m/s)  Right Fibular Motor (Ext Dig Brev)  30.1C  Ankle    3.8 <6.1 4.7 >2.5 B Fib Ankle 7.1 29.0 41 >38  B Fib    10.9  3.9  Poplt B Fib 5.0 9.0 *18 >40  Poplt    15.9  0.0         Right Tibial Motor (Abd Hall Brev)  30.3C  Ankle    4.1 <6.1 12.7 >3.0 Knee Ankle 7.4 36.0 49 >35  Knee    11.5  0.1          EMG   Side Muscle Nerve Root Ins Act Fibs Psw Amp Dur Poly Recrt Int Dennie Bible Comment  Right AntTibialis Dp Br Peron L4-5 *Incr Nml Nml Nml Nml 0 Nml Nml   Right Fibularis Longus  Sup Br Peron L5-S1 *Incr *1+ *1+ Nml Nml 0 Nml Nml   Right MedGastroc Tibial S1-2 Nml Nml Nml Nml Nml 0 Nml Nml   Right VastusMed Femoral L2-4 Nml Nml Nml Nml Nml 0 Nml Nml   Right BicepsFemS Sciatic L5-S1 Nml Nml Nml Nml Nml 0 Nml Nml     Nerve Conduction Studies Anti Sensory Left/Right Comparison   Stim Site L Lat (ms) R Lat (ms) L-R Lat (ms) L Amp (V) R Amp (V) L-R Amp (%) Site1 Site2 L Vel (m/s) R Vel (m/s) L-R Vel (m/s)  Saphenous Anti Sensory (Ant Med Mall)  30.4C  14cm  4.0   13.0  14cm Ant Med Mall     Sup Fibular Anti Sensory (Ant Lat Mall)  30.4C  14 cm  4.3   7.7  14 cm Ant Lat Mall  33   Sural Anti Sensory (Lat Mall)  30.3C  Calf  4.0   13.3  Calf Lat Mall  35    Motor Left/Right Comparison   Stim Site L Lat (ms) R  Lat (ms) L-R Lat (ms) L Amp (mV) R Amp (mV) L-R Amp (%) Site1 Site2 L Vel (m/s) R Vel (m/s) L-R Vel (m/s)  Fibular Motor (Ext Dig Brev)  30.1C  Ankle  3.8   4.7  B Fib Ankle  41   B Fib  10.9   3.9  Poplt B Fib  *18   Poplt  15.9   0.0        Tibial Motor (Abd  Hall Brev)  30.3C  Ankle  4.1   12.7  Knee Ankle  49   Knee  11.5   0.1           Waveforms:             Clinical History: MRI Knee Other: No fluid collection or hematoma. Muscles are normal. Peroneal nerve appears normal.  IMPRESSION: 1. Small vertical tear along the undersurface of the posterior horn of the medial meniscus. Small oblique tear of the body of medial meniscus extending to the inferior articular surface. 2. Degeneration of the lateral meniscus. Oblique tear of the posterior horn of the lateral meniscus extending to the inferior articular surface. Focal synovitis along the periphery of the body of the lateral meniscus. 3. 1.3 x 3.2 x 3.6 cm complex fluid collection between the distal iliotibial band and periphery of the lateral femoral condyle most consistent with chronic bursitis. 4. Complete chronic ACL tear.   Electronically Signed   By: Elige Ko   On: 01/01/2020 15:02 ----  MRI LUMBAR SPINE WITHOUT CONTRAST    TECHNIQUE:  Multiplanar, multisequence MR imaging of the lumbar spine was  performed. No intravenous contrast was administered.    COMPARISON: None.    FINDINGS:  Segmentation: There are 5 non-rib bearing lumbar type vertebral  bodies with the last intervertebral disc space labeled as L5-S1.    Alignment: There is straightening of the normal lumbar lordosis.    Vertebrae: The vertebral body heights are well maintained. No  fracture, marrow edema,or pathologic marrow infiltration. There is  disc height loss with vacuum phenomenon seen at L5-S1.    Conus medullaris and cauda equina: Conus extends to the T12 level.  Conus and cauda equina appear normal. There is  congenital shortening  of the pedicles with a narrowed canal throughout.    Paraspinal and other soft tissues: The paraspinal soft tissues and  visualized retroperitoneal structures are unremarkable. The  sacroiliac joints are intact.    Disc levels:    T12-L1: No significant canal or neural foraminal narrowing.    L1-L2: There is a broad-based disc bulge with facet arthrosis which  causes moderate bilateral neural foraminal narrowing. There is mild  central canal narrowing.    L2-L3: There is a broad-based disc bulge with facet arthrosis and  ligamentum flavum hypertrophy. There is moderate bilateral neural  foraminal narrowing. There is mild central canal stenosis.    L3-L4: There is a broad-based disc bulge with facet arthrosis and  ligamentum flavum hypertrophy. Severe bilateral neural foraminal  narrowing is seen. Mild central canal stenosis noted.    L4-L5: There is a broad-based disc bulge with facet arthrosis and  ligamentum flavum hypertrophy which causes severe bilateral neural  foraminal narrowing. There is mild effacement anterior thecal sac.    L5-S1: There is a broad-based disc bulge with a central disc  protrusion which contacts the bilateral descending S1 nerve roots  without impingement. There is severe bilateral neural foraminal  narrowing.    IMPRESSION:  1. Straightening of the normal lumbar lordosis with findings  suggestive of mild congenital shortening of the pedicles.  2. Lumbar spine spondylosis most notable at L5-S1 with a central  disc protrusion which contacts the bilateral descending S1 nerve  roots with severe bilateral neural foraminal narrowing. Also at  L3-L4 and L4-L5 with severe bilateral neural foraminal narrowing and  mild central canal stenosis.      Electronically Signed  By: Jonna Clark M.D.  On: 03/05/2019 15:12  He reports that he has never smoked. He has never used smokeless tobacco.  Recent Labs     01/21/19 1610 04/24/19 1348 07/27/19 0849  HGBA1C 9.2* 6.7* 6.6*    Objective:  VS:  HT:    WT:   BMI:     BP:   HR: bpm  TEMP: ( )  RESP:  Physical Exam Constitutional:      General: He is not in acute distress.    Appearance: Normal appearance. He is not ill-appearing.  HENT:     Head: Normocephalic and atraumatic.     Right Ear: External ear normal.     Left Ear: External ear normal.  Eyes:     Extraocular Movements: Extraocular movements intact.  Cardiovascular:     Rate and Rhythm: Normal rate.     Pulses: Normal pulses.  Abdominal:     General: There is no distension.     Palpations: Abdomen is soft.  Musculoskeletal:        General: No tenderness or signs of injury.     Right lower leg: No edema.     Left lower leg: No edema.     Comments: Patient ambulates without aid.  Inspection reveals no intrinsic muscle atrophy of the feet bilaterally.  He appears to have some atrophy of the calf musculature on the right more than the left.  He does have some swelling or increased size on the lateral aspect of the right knee with some pain to palpation along this area.  He has a negative Tinel's over the fibular head.  He has good distal strength.  Subjectively has some decreased sensation in somewhat of an L5 or fibular nerve distribution more of a superficial distribution than deep.  Skin:    Findings: No erythema, lesion or rash.  Neurological:     General: No focal deficit present.     Mental Status: He is alert and oriented to person, place, and time.     Sensory: Sensory deficit present.     Motor: No weakness or abnormal muscle tone.     Coordination: Coordination normal.  Psychiatric:        Mood and Affect: Mood normal.        Behavior: Behavior normal.     Ortho Exam  Imaging: No results found.  Past Medical/Family/Surgical/Social History: Medications & Allergies reviewed per EMR, new medications updated. Patient Active Problem List   Diagnosis Date Noted   . Atrophy of quadriceps femoris muscle 01/30/2019  . Gastroesophageal reflux disease with esophagitis 12/24/2017  . Groin rash 12/24/2017  . Benign prostatic hyperplasia with incomplete bladder emptying 10/23/2017  . Positive fecal occult blood test 10/23/2017  . Vocal process granuloma 03/01/2017  . Laryngopharyngeal reflux (LPR) 03/01/2017  . Seborrheic dermatitis of scalp 08/16/2016  . Muscle cramps 05/27/2015  . Leg pain, lateral, right 05/27/2015  . AV block, 1st degree 02/10/2013  . DM (diabetes mellitus) (HCC) 02/10/2013  . Hyperlipidemia 02/25/2007  . BACK PAIN, LUMBAR, CHRONIC 05/31/2006   Past Medical History:  Diagnosis Date  . DM (diabetes mellitus), type 2, uncontrolled (HCC) 02/10/2013  . GERD (gastroesophageal reflux disease)    Family History  Problem Relation Age of Onset  . Breast cancer Sister   . Breast cancer Sister   . Uterine cancer Sister   . Diabetes Other   . Colon cancer Neg Hx   . Esophageal cancer Neg Hx   . Rectal cancer Neg Hx   . Stomach cancer Neg Hx   .  Liver cancer Neg Hx    Past Surgical History:  Procedure Laterality Date  . NO PAST SURGERIES     Social History   Occupational History  . Occupation: Maintenance  Tobacco Use  . Smoking status: Never Smoker  . Smokeless tobacco: Never Used  Vaping Use  . Vaping Use: Never used  Substance and Sexual Activity  . Alcohol use: Yes    Comment: socially  . Drug use: No  . Sexual activity: Not on file

## 2020-01-06 NOTE — Progress Notes (Signed)
Office Visit Note   Patient: Edward Mcgee           Date of Birth: 07-14-1955           MRN: [id number] Visit Date: 01/06/2020              Requested by: Anne Ng, NP [address] [place],  [place] [address] PCP: Anne Ng, NP   Assessment & Plan: Visit Diagnoses:  1. Mass of right knee   2. Swelling of joint of right knee   3. Peroneal neuropathy at knee, right   4. Acute lateral meniscus tear of right knee, initial encounter   5. Acute medial meniscus tear of right knee, initial encounter     Plan: Plan: Knee is suffering from osteoarthritis, only real proven treatments are Weight loss, NSIADs like motrin and exercise. Well padded shoes help. Ice the knee that is suffering from osteoarthritis, only real proven treatments are Weight loss, NSIADs like diclofenac and exercise. Well padded shoes help. Ice the knee 2-3 times a day 15-20 mins at a time.-3 times a day 15-20 mins at a time. Hot showers in the AM.  Injection with steroid may be of benefit. Hemp CBD capsules, amazon.com 5,000-7,000 mg per bottle, 60 capsules per bottle, take one capsule twice a day. Cane in the left hand to use with left leg weight bearing. Follow-Up Instructions: No follow-ups on file.  You have torn cartilage in the right knee and a torn ACL ligament. I am referring you to Dr. August Saucer for evaluation and consideration of treatment of the right knee and this may involve arthroscopy and decompression of the peroneal nerve at the right knee. I do not think lumbar surgery will be of any benefit.  Follow-Up Instructions: Return in about 2 weeks (around 01/20/2020), or Schedule appointment to see Dr. August Saucer  eval right knee arthroscopy torn medial and lateral menisci, for Also peroneal nerve entrapment at the knee for release...   Orders:  Orders Placed This Encounter  Procedures  . Large Joint Inj: R knee   Meds ordered this encounter  Medications  . DISCONTD: ibuprofen  (ADVIL) 800 MG tablet    Sig: Take 1 tablet (800 mg total) by mouth every 8 (eight) hours as needed.    Dispense:  30 tablet    Refill:  0  . HYDROcodone-acetaminophen (NORCO/VICODIN) 5-325 MG tablet    Sig: Take 1-2 tablets by mouth at bedtime as needed for moderate pain.    Dispense:  20 tablet    Refill:  0  . ibuprofen (ADVIL) 800 MG tablet    Sig: Take 1 tablet (800 mg total) by mouth every 8 (eight) hours as needed.    Dispense:  90 tablet    Refill:  0  . gabapentin (NEURONTIN) 300 MG capsule    Sig: Take 1 capsule (300 mg total) by mouth at bedtime.    Dispense:  30 capsule    Refill:  2      Procedures: Large Joint Inj: R knee on 01/06/2020 10:57 AM Indications: pain, joint swelling and diagnostic evaluation Details: 18 G 1.5 in needle, anterolateral approach  Arthrogram: No  Medications: 40 mg methylPREDNISolone acetate 40 MG/ML; 3 mL bupivacaine 0.5 %; 1 mL lidocaine 1 % Aspirate: clear Outcome: tolerated well, no immediate complications  Clear viscous fluid lateral right knee anterior to ITB consistent with ganglion cyst. Procedure, treatment alternatives, risks and benefits explained, specific risks discussed. Consent was given by the patient.  Immediately prior to procedure a time out was called to verify the correct patient, procedure, equipment, support staff and site/side marked as required. Patient was prepped and draped in the usual sterile fashion.       Clinical Data: No additional findings.   Subjective: Chief Complaint  Patient presents with  . Right Leg - Follow-up    EMG/NCS Review    64 year old male with history of lumbar disc herniation L5-S1 has had good relief with ESIs in the past but with increasing right knee pain With swelling over the lateral right knee and pain radiating downwards. He is having no back symptoms but is unable to fully straighten the right  Knee. There is a mass over the anterior right distal ITB at the lateral femoral  condyle and TInels sign is positive over the right common peroneal  Nerve posterior to the right distal ITB. MRI demonstrates right medial and lateral meniscal tears with cyst at the area of the right ITB. No definite  Mass affect on the peroneal n. .   Review of Systems  Constitutional: Negative.   HENT: Negative.   Eyes: Negative.   Respiratory: Negative.   Cardiovascular: Negative.   Gastrointestinal: Negative.   Endocrine: Negative.   Genitourinary: Negative.   Musculoskeletal: Negative.   Skin: Negative.   Allergic/Immunologic: Negative.   Neurological: Negative.   Hematological: Negative.   Psychiatric/Behavioral: Negative.      Objective: Vital Signs: BP (!) 164/82 (BP Location: Right Arm, Patient Position: Sitting)   Pulse 73   Ht 5\' 4"  (1.626 m)   Wt 160 lb (72.6 kg)   BMI 27.46 kg/m   Physical Exam Constitutional:      Appearance: He is well-developed.  HENT:     Head: Normocephalic and atraumatic.  Eyes:     Pupils: Pupils are equal, round, and reactive to light.  Pulmonary:     Effort: Pulmonary effort is normal.     Breath sounds: Normal breath sounds.  Abdominal:     General: Bowel sounds are normal.     Palpations: Abdomen is soft.  Musculoskeletal:     Cervical back: Normal range of motion and neck supple.     Right knee:     Instability Tests: Lateral McMurray test positive.  Skin:    General: Skin is warm and dry.  Neurological:     Mental Status: He is alert and oriented to person, place, and time.  Psychiatric:        Behavior: Behavior normal.        Thought Content: Thought content normal.        Judgment: Judgment normal.     Right Knee Exam   Tenderness  The patient is experiencing tenderness in the lateral joint line, medial retinaculum and medial joint line.  Range of Motion  Extension: abnormal  Flexion: abnormal   Tests  McMurray:  Lateral - positive Lachman:  Anterior - negative    Posterior - negative Drawer:   Anterior - negative    Posterior - negative Pivot shift: trace Patellar apprehension: negative  Other  Erythema: absent Scars: absent Sensation: decreased Swelling: mild  Comments:  Right lateral swelling palpable ballotment MRI with cyst here. Aspirated 1 cc of clear viscous material, ganglion? Injected with marcain and depomedrol.      Specialty Comments:  No specialty comments available.  Imaging: No results found.   PMFS History: Patient Active Problem List   Diagnosis Date Noted  . Atrophy of quadriceps  femoris muscle 01/30/2019  . Gastroesophageal reflux disease with esophagitis 12/24/2017  . Groin rash 12/24/2017  . Benign prostatic hyperplasia with incomplete bladder emptying 10/23/2017  . Positive fecal occult blood test 10/23/2017  . Vocal process granuloma 03/01/2017  . Laryngopharyngeal reflux (LPR) 03/01/2017  . Seborrheic dermatitis of scalp 08/16/2016  . Muscle cramps 05/27/2015  . Leg pain, lateral, right 05/27/2015  . AV block, 1st degree 02/10/2013  . DM (diabetes mellitus) (HCC) 02/10/2013  . Hyperlipidemia 02/25/2007  . BACK PAIN, LUMBAR, CHRONIC 05/31/2006   Past Medical History:  Diagnosis Date  . DM (diabetes mellitus), type 2, uncontrolled (HCC) 02/10/2013  . GERD (gastroesophageal reflux disease)     Family History  Problem Relation Age of Onset  . Breast cancer Sister   . Breast cancer Sister   . Uterine cancer Sister   . Diabetes Other   . Colon cancer Neg Hx   . Esophageal cancer Neg Hx   . Rectal cancer Neg Hx   . Stomach cancer Neg Hx   . Liver cancer Neg Hx     Past Surgical History:  Procedure Laterality Date  . NO PAST SURGERIES     Social History   Occupational History  . Occupation: Maintenance  Tobacco Use  . Smoking status: Never Smoker  . Smokeless tobacco: Never Used  Vaping Use  . Vaping Use: Never used  Substance and Sexual Activity  . Alcohol use: Yes    Comment: socially  . Drug use: No  . Sexual  activity: Not on file

## 2020-01-07 ENCOUNTER — Ambulatory Visit: Payer: BC Managed Care – PPO | Admitting: Surgery

## 2020-01-18 ENCOUNTER — Ambulatory Visit (HOSPITAL_COMMUNITY): Payer: BC Managed Care – PPO

## 2020-01-20 ENCOUNTER — Ambulatory Visit: Payer: Self-pay | Admitting: Orthopedic Surgery

## 2020-01-25 ENCOUNTER — Other Ambulatory Visit: Payer: Self-pay

## 2020-01-25 ENCOUNTER — Ambulatory Visit (INDEPENDENT_AMBULATORY_CARE_PROVIDER_SITE_OTHER): Payer: Medicare Other | Admitting: Nurse Practitioner

## 2020-01-25 VITALS — BP 138/80 | HR 74 | Temp 97.3°F | Wt 158.0 lb

## 2020-01-25 DIAGNOSIS — E1165 Type 2 diabetes mellitus with hyperglycemia: Secondary | ICD-10-CM | POA: Diagnosis not present

## 2020-01-25 DIAGNOSIS — K2100 Gastro-esophageal reflux disease with esophagitis, without bleeding: Secondary | ICD-10-CM

## 2020-01-25 DIAGNOSIS — E1141 Type 2 diabetes mellitus with diabetic mononeuropathy: Secondary | ICD-10-CM

## 2020-01-25 DIAGNOSIS — Z23 Encounter for immunization: Secondary | ICD-10-CM | POA: Diagnosis not present

## 2020-01-25 DIAGNOSIS — E782 Mixed hyperlipidemia: Secondary | ICD-10-CM | POA: Diagnosis not present

## 2020-01-25 DIAGNOSIS — R070 Pain in throat: Secondary | ICD-10-CM

## 2020-01-25 LAB — BASIC METABOLIC PANEL
BUN: 16 mg/dL (ref 6–23)
CO2: 29 mEq/L (ref 19–32)
Calcium: 9.7 mg/dL (ref 8.4–10.5)
Chloride: 104 mEq/L (ref 96–112)
Creatinine, Ser: 0.9 mg/dL (ref 0.40–1.50)
GFR: 84.58 mL/min (ref >60.00)
Glucose, Bld: 147 mg/dL — ABNORMAL HIGH (ref 70–99)
Potassium: 4 mEq/L (ref 3.5–5.1)
Sodium: 142 mEq/L (ref 135–145)

## 2020-01-25 LAB — HEPATIC FUNCTION PANEL
ALT: 27 U/L (ref 0–53)
AST: 19 U/L (ref 0–37)
Albumin: 4.3 g/dL (ref 3.5–5.2)
Alkaline Phosphatase: 71 U/L (ref 39–117)
Bilirubin, Direct: 0.1 mg/dL (ref 0.0–0.3)
Total Bilirubin: 0.5 mg/dL (ref 0.2–1.2)
Total Protein: 6.6 g/dL (ref 6.0–8.3)

## 2020-01-25 LAB — LIPID PANEL
Cholesterol: 157 mg/dL (ref 0–200)
HDL: 63.8 mg/dL (ref >39.00)
LDL Cholesterol: 73 mg/dL (ref 0–99)
NonHDL: 93.41
Total CHOL/HDL Ratio: 2
Triglycerides: 104 mg/dL (ref 0.0–149.0)
VLDL: 20.8 mg/dL (ref 0.0–40.0)

## 2020-01-25 LAB — HEMOGLOBIN A1C: Hgb A1c MFr Bld: 6.9 % — ABNORMAL HIGH (ref 4.6–6.5)

## 2020-01-25 MED ORDER — OMEPRAZOLE 20 MG PO CPDR: 20.0000 mg | DELAYED_RELEASE_CAPSULE | Freq: Every day | ORAL | 1 refills | Status: DC

## 2020-01-25 NOTE — Patient Instructions (Addendum)
Go to lab for blood draw. We will obtain eye exam report from [facility]

## 2020-01-25 NOTE — Progress Notes (Signed)
Subjective:  Patient ID: Edward Mcgee, male    DOB: March 10, 1956  Age: 64 y.o. MRN: [id number]  CC: Follow-up (6 month f/u  HTN, DM, hyperlipidemia)  HPI  DM (diabetes mellitus) ([place]) Controlled with metformin and glipizide hbgA1c of 6.6 Continue current medications   GERD (gastroesophageal reflux disease) Controlled with omeprazole rx sent  Hyperlipidemia LDL at goal with atorvastatin Continue current medication Lipid Panel     Component Value Date/Time   CHOL 157 01/25/2020 0840   TRIG 104.0 01/25/2020 0840   HDL 63.80 01/25/2020 0840   CHOLHDL 2 01/25/2020 0840   VLDL 20.8 01/25/2020 0840   LDLCALC 73 01/25/2020 0840     Reviewed past Medical, Social and Family history today.  Outpatient Medications Prior to Visit  Medication Sig Dispense Refill   blood glucose meter kit and supplies KIT Dispense based on patient and insurance preference. Use before breakfast and at bedtime. ICD E11.9 1 each 0   chlorhexidine (PERIDEX) 0.12 % solution 15 mLs 2 (two) times daily.     Cholecalciferol (VITAMIN D3 PO) Take by mouth.     ibuprofen (ADVIL) 800 MG tablet Take 1 tablet (800 mg total) by mouth every 8 (eight) hours as needed. 90 tablet 0   Multiple Vitamin (MULTIVITAMIN) tablet Take 1 tablet by mouth daily.     ONETOUCH DELICA LANCETS FINE MISC      ONETOUCH ULTRA test strip USE 1 STRIP TO CHECK GLUCOSE BEFORE BREAKFAST AND AT BEDTIME 100 each 3   tamsulosin (FLOMAX) 0.4 MG CAPS capsule Take 1 capsule (0.4 mg total) by mouth daily. 90 capsule 3   ZINC SULFATE OP Apply to eye.     atorvastatin (LIPITOR) 20 MG tablet Take 1 tablet (20 mg total) by mouth daily at 6 PM. 90 tablet 1   glipiZIDE (GLUCOTROL XL) 5 MG 24 hr tablet Take 1 tablet (5 mg total) by mouth daily with breakfast. 90 tablet 1   metFORMIN (GLUCOPHAGE) 500 MG tablet Take 1 tablet (500 mg total) by mouth 2 (two) times daily with a meal. 180 tablet 3   omeprazole (PRILOSEC) 20 MG capsule Take 1  capsule by mouth once daily 90 capsule 0   baclofen (LIORESAL) 10 MG tablet Take 0.5-1 tablets (5-10 mg total) by mouth at bedtime as needed for muscle spasms. (Patient not taking: Reported on 01/25/2020) 30 each 3   gabapentin (NEURONTIN) 300 MG capsule Take 1 capsule (300 mg total) by mouth at bedtime. (Patient not taking: Reported on 01/25/2020) 30 capsule 2   HYDROcodone-acetaminophen (NORCO/VICODIN) 5-325 MG tablet Take 1-2 tablets by mouth at bedtime as needed for moderate pain. (Patient not taking: Reported on 01/25/2020) 20 tablet 0   Facility-Administered Medications Prior to Visit  Medication Dose Route Frequency Provider Last Rate Last Admin   0.9 %  sodium chloride infusion  500 mL Intravenous Continuous Milus Banister, MD        ROS See HPI  Objective:  BP 138/80 (BP Location: Right Arm, Patient Position: Sitting, Cuff Size: Normal)    Pulse 74    Temp (!) 97.3 F (36.3 C) (Temporal)    Wt 158 lb (71.7 kg)    SpO2 98%    BMI 27.12 kg/m   Physical Exam Vitals reviewed.  Cardiovascular:     Rate and Rhythm: Normal rate.     Pulses: Normal pulses.     Heart sounds: Normal heart sounds.  Pulmonary:     Effort: Pulmonary effort is normal.  Breath sounds: Normal breath sounds.  Musculoskeletal:     Right lower leg: No edema.     Left lower leg: No edema.  Neurological:     Mental Status: He is alert and oriented to person, place, and time.    Assessment & Plan:  This visit occurred during the SARS-CoV-2 public health emergency.  Safety protocols were in place, including screening questions prior to the visit, additional usage of staff PPE, and extensive cleaning of exam room while observing appropriate contact time as indicated for disinfecting solutions.   Edward Mcgee was seen today for follow-up.  Diagnoses and all orders for this visit:  Type 2 diabetes mellitus with diabetic mononeuropathy, without long-term current use of insulin (HCC) -     Hemoglobin A1c -      Basic metabolic panel  Mixed hyperlipidemia -     Hepatic function panel -     Lipid panel -     atorvastatin (LIPITOR) 20 MG tablet; Take 1 tablet (20 mg total) by mouth daily at 6 PM.  Gastroesophageal reflux disease with esophagitis without hemorrhage -     omeprazole (PRILOSEC) 20 MG capsule; Take 1 capsule (20 mg total) by mouth daily.  Need for pneumococcal vaccination -     Pneumococcal polysaccharide vaccine 23-valent greater than or equal to 2yo subcutaneous/IM  Type 2 diabetes mellitus with hyperglycemia, without long-term current use of insulin (HCC) -     metFORMIN (GLUCOPHAGE) 500 MG tablet; Take 1 tablet (500 mg total) by mouth 2 (two) times daily with a meal. -     glipiZIDE (GLUCOTROL XL) 5 MG 24 hr tablet; Take 1 tablet (5 mg total) by mouth daily with breakfast.    Problem List Items Addressed This Visit      Digestive   GERD (gastroesophageal reflux disease)    Controlled with omeprazole rx sent      Relevant Medications   omeprazole (PRILOSEC) 20 MG capsule     Endocrine   DM (diabetes mellitus) ([place]) - Primary    Controlled with metformin and glipizide hbgA1c of 6.6 Continue current medications       Relevant Medications   metFORMIN (GLUCOPHAGE) 500 MG tablet   glipiZIDE (GLUCOTROL XL) 5 MG 24 hr tablet   atorvastatin (LIPITOR) 20 MG tablet   Other Relevant Orders   Hemoglobin A1c (Completed)   Basic metabolic panel (Completed)     Other   Hyperlipidemia    LDL at goal with atorvastatin Continue current medication Lipid Panel     Component Value Date/Time   CHOL 157 01/25/2020 0840   TRIG 104.0 01/25/2020 0840   HDL 63.80 01/25/2020 0840   CHOLHDL 2 01/25/2020 0840   VLDL 20.8 01/25/2020 0840   LDLCALC 73 01/25/2020 0840         Relevant Medications   atorvastatin (LIPITOR) 20 MG tablet   Other Relevant Orders   Hepatic function panel (Completed)   Lipid panel (Completed)    Other Visit Diagnoses    Need for pneumococcal  vaccination       Relevant Orders   Pneumococcal polysaccharide vaccine 23-valent greater than or equal to 2yo subcutaneous/IM (Completed)      Follow-up: Return in about 3 months (around 04/26/2020) for Welcome to Medicare and DM f/up (F2F, 28mns).  CWilfred Lacy NP

## 2020-01-28 ENCOUNTER — Telehealth: Payer: Self-pay | Admitting: Nurse Practitioner

## 2020-01-28 ENCOUNTER — Ambulatory Visit: Payer: BC Managed Care – PPO | Admitting: Specialist

## 2020-01-28 ENCOUNTER — Ambulatory Visit (INDEPENDENT_AMBULATORY_CARE_PROVIDER_SITE_OTHER): Payer: Medicare Other | Admitting: Orthopedic Surgery

## 2020-01-28 ENCOUNTER — Encounter: Payer: Self-pay | Admitting: Orthopedic Surgery

## 2020-01-28 ENCOUNTER — Encounter: Payer: Self-pay | Admitting: Nurse Practitioner

## 2020-01-28 VITALS — Ht 64.0 in | Wt 158.0 lb

## 2020-01-28 DIAGNOSIS — S83241A Other tear of medial meniscus, current injury, right knee, initial encounter: Secondary | ICD-10-CM

## 2020-01-28 DIAGNOSIS — S83281A Other tear of lateral meniscus, current injury, right knee, initial encounter: Secondary | ICD-10-CM | POA: Diagnosis not present

## 2020-01-28 MED ORDER — ATORVASTATIN CALCIUM 20 MG PO TABS: 20.0000 mg | ORAL_TABLET | Freq: Every day | ORAL | 1 refills | Status: DC

## 2020-01-28 MED ORDER — METFORMIN HCL 500 MG PO TABS: 500.0000 mg | ORAL_TABLET | Freq: Two times a day (BID) | ORAL | 3 refills | Status: DC

## 2020-01-28 MED ORDER — GLIPIZIDE ER 5 MG PO TB24: 5.0000 mg | ORAL_TABLET | Freq: Every day | ORAL | 1 refills | Status: DC

## 2020-01-28 NOTE — Telephone Encounter (Signed)
Patient is calling to speak to the staff regarding his medications. Had trouble understanding what they were asking/requesting. Please call him back at [phone].

## 2020-01-28 NOTE — Telephone Encounter (Signed)
Rx sent 

## 2020-01-28 NOTE — Telephone Encounter (Signed)
Spoke with patient and his wife and they stated they went to pick up Rx at [facility] in [place] and because they just recently switched to them they told the patient they needed all of the refills that they were getting sent in to that location because it was sent to the [facility] in [place] and they could not fill them unless we sent it to the [facility]. Please advise

## 2020-01-28 NOTE — Assessment & Plan Note (Signed)
LDL at goal with atorvastatin Continue current medication Lipid Panel     Component Value Date/Time   CHOL 157 01/25/2020 0840   TRIG 104.0 01/25/2020 0840   HDL 63.80 01/25/2020 0840   CHOLHDL 2 01/25/2020 0840   VLDL 20.8 01/25/2020 0840   LDLCALC 73 01/25/2020 0840

## 2020-01-28 NOTE — Assessment & Plan Note (Signed)
Controlled with metformin and glipizide hbgA1c of 6.6 Continue current medications

## 2020-01-28 NOTE — Assessment & Plan Note (Signed)
Controlled with omeprazole rx sent

## 2020-01-30 ENCOUNTER — Encounter: Payer: Self-pay | Admitting: Orthopedic Surgery

## 2020-01-30 NOTE — Progress Notes (Signed)
Office Visit Note   Patient: Edward Mcgee           Date of Birth: 1956/04/12           MRN: [id number] Visit Date: 01/28/2020 Requested by: Anne Ng, NP [address] [place],  [place] [address] PCP: Anne Ng, NP  Subjective: Chief Complaint  Patient presents with  . Right Knee - Pain    HPI: Edward Mcgee is a 64 y.o. male who presents to the office complaining of right knee pain.  Patient is referred from Dr. Otelia Sergeant.  He notes right knee pain for the last year.  No known injury.  He saw Dr. Otelia Sergeant who ordered an MRI of the right knee that revealed complete chronic ACL tear, small vertical tear of the medial meniscus, degeneration of the lateral meniscus with oblique tear of the posterior horn as well as a complex fluid collection between the distal iliotibial band and periphery of the lateral femoral condyle.  Patient is a retired Consulting civil engineer.  He had a right knee injection by Dr. Otelia Sergeant at his last office visit before referral.  Today he states that his pain is rated 0/10.  He is about 3 weeks out from injection.  He states that the injection has provided significant relief of his pain.  He denies any numbness or tingling down his leg.  Denies any weakness in his leg.  He notes no instability throughout the history of his right knee pain but states that he did have occasional locking that has since resolved since his injection..                ROS: All systems reviewed are negative as they relate to the chief complaint within the history of present illness.  Patient denies fevers or chills.  Assessment & Plan: Visit Diagnoses: No diagnosis found.  Plan: Patient is a 64 year old male who presents complaint of right knee pain for 1 year.  He had a right knee injection by Dr. Otelia Sergeant about 3 weeks ago that has provided significant relief and now his pain is rated 0/10.  He has no significant weakness, instability, locking, mechanical symptoms of the right knee  since injection.  MRI scan was reviewed today.  No need for any intervention at this time with patient's symptoms resolved, at least for now.  No significant concern for any peroneal neuropathy currently, since patient's knee injection.  Negative Tinel's sign over the peroneal nerve and no weakness with dorsiflexion or altered sensation distally.  Plan for patient to follow-up in 2 months for clinical recheck on the cyst with possibility for another injection.  Patient agreed with this plan.  Follow-Up Instructions: No follow-ups on file.   Orders:  No orders of the defined types were placed in this encounter.  No orders of the defined types were placed in this encounter.     Procedures: No procedures performed   Clinical Data: No additional findings.  Objective: Vital Signs: Ht 5\' 4"  (1.626 m)   Wt 158 lb (71.7 kg)   BMI 27.12 kg/m   Physical Exam:  Constitutional: Patient appears well-developed HEENT:  Head: Normocephalic Eyes:EOM are normal Neck: Normal range of motion Cardiovascular: Normal rate Pulmonary/chest: Effort normal Neurologic: Patient is alert Skin: Skin is warm Psychiatric: Patient has normal mood and affect  Ortho Exam: Ortho exam demonstrates right knee with no significant effusion.  No significant tenderness over the medial lateral joint lines.  Extensor mechanism is intact.  0  degrees of extension to about 120 degrees of flexion.  There is a palpable fluid collection laterally.  Negative Tinel's sign over this fluid collection.  No loss of sensation throughout the right lower extremity compared with the contralateral side.  No weakness with dorsiflexion compared with contralateral side.  Specialty Comments:  No specialty comments available.  Imaging: No results found.   PMFS History: Patient Active Problem List   Diagnosis Date Noted  . Atrophy of quadriceps femoris muscle 01/30/2019  . GERD (gastroesophageal reflux disease) 12/24/2017  . Groin  rash 12/24/2017  . Benign prostatic hyperplasia with incomplete bladder emptying 10/23/2017  . Positive fecal occult blood test 10/23/2017  . Vocal process granuloma 03/01/2017  . Laryngopharyngeal reflux (LPR) 03/01/2017  . Seborrheic dermatitis of scalp 08/16/2016  . Muscle cramps 05/27/2015  . Leg pain, lateral, right 05/27/2015  . AV block, 1st degree 02/10/2013  . DM (diabetes mellitus) (HCC) 02/10/2013  . Hyperlipidemia 02/25/2007  . BACK PAIN, LUMBAR, CHRONIC 05/31/2006   Past Medical History:  Diagnosis Date  . DM (diabetes mellitus), type 2, uncontrolled (HCC) 02/10/2013  . GERD (gastroesophageal reflux disease)     Family History  Problem Relation Age of Onset  . Breast cancer Sister   . Breast cancer Sister   . Uterine cancer Sister   . Diabetes Other   . Colon cancer Neg Hx   . Esophageal cancer Neg Hx   . Rectal cancer Neg Hx   . Stomach cancer Neg Hx   . Liver cancer Neg Hx     Past Surgical History:  Procedure Laterality Date  . NO PAST SURGERIES     Social History   Occupational History  . Occupation: Maintenance  Tobacco Use  . Smoking status: Never Smoker  . Smokeless tobacco: Never Used  Vaping Use  . Vaping Use: Never used  Substance and Sexual Activity  . Alcohol use: Yes    Comment: socially  . Drug use: No  . Sexual activity: Not on file

## 2020-02-01 ENCOUNTER — Encounter: Payer: Self-pay | Admitting: Nurse Practitioner

## 2020-02-01 NOTE — Progress Notes (Signed)
Abstracted result and sent to scan  

## 2020-02-05 ENCOUNTER — Other Ambulatory Visit: Payer: Self-pay

## 2020-02-05 DIAGNOSIS — E1141 Type 2 diabetes mellitus with diabetic mononeuropathy: Secondary | ICD-10-CM

## 2020-02-05 MED ORDER — ONETOUCH ULTRA VI STRP: ORAL_STRIP | 3 refills | Status: DC

## 2020-02-05 NOTE — Telephone Encounter (Signed)
Pharmacy request for refills Last filled on 01/22/20 with no refills Spoke with pharmacy and they stated they sent request because they noticed patient didn't have any more refills on file.

## 2020-02-16 ENCOUNTER — Other Ambulatory Visit: Payer: Self-pay

## 2020-02-16 DIAGNOSIS — E1141 Type 2 diabetes mellitus with diabetic mononeuropathy: Secondary | ICD-10-CM

## 2020-02-16 MED ORDER — ONETOUCH ULTRA VI STRP: ORAL_STRIP | 3 refills | Status: DC

## 2020-02-16 NOTE — Telephone Encounter (Signed)
[facility] pharmacy called stating they need a new Rx for patients test strips because due to his insurance he is only covered to test once a day and our previous Rx for them says twice a day.  Rx has been updated.

## 2020-03-30 ENCOUNTER — Other Ambulatory Visit: Payer: Self-pay

## 2020-03-30 ENCOUNTER — Ambulatory Visit (INDEPENDENT_AMBULATORY_CARE_PROVIDER_SITE_OTHER): Payer: Medicare Other | Admitting: Orthopedic Surgery

## 2020-03-30 DIAGNOSIS — S83281A Other tear of lateral meniscus, current injury, right knee, initial encounter: Secondary | ICD-10-CM | POA: Diagnosis not present

## 2020-04-03 ENCOUNTER — Encounter: Payer: Self-pay | Admitting: Orthopedic Surgery

## 2020-04-03 NOTE — Progress Notes (Signed)
Office Visit Note   Patient: Edward Mcgee           Date of Birth: 1955-11-25           MRN: [id number] Visit Date: 03/30/2020 Requested by: Anne Ng, NP [address] [place],  [place] [address] PCP: Anne Ng, NP  Subjective: Chief Complaint  Patient presents with  . Right Knee - Follow-up    HPI: Patient presents for follow-up of right knee pain.  He does have a small meniscal tear and meniscal cyst which has expanded underneath the iliotibial band.  He did have an injection in the knee with Dr. Otelia Sergeant which gave him good relief.  Presents now for follow-up of his knee so we can check on him clinically and decide whether or not any more intervention is indicated.              ROS: All systems reviewed are negative as they relate to the chief complaint within the history of present illness.  Patient denies  fevers or chills.   Assessment & Plan: Visit Diagnoses: No diagnosis found.  Plan: Impression is patient is doing considerably well with his right knee meniscal cyst and degenerative meniscal tearing.  We talked about knee injection as well as cyst aspiration today.  Currently the patient's symptoms do not really warrant that intervention.  Had extensive discussion with him today through an interpreter.  Next step would be cyst aspiration if his pain recurs.  Currently his situation is very livable and he desires no intervention at this time.  Follow-up with [place] as needed.  Follow-Up Instructions: Return if symptoms worsen or fail to improve.   Orders:  No orders of the defined types were placed in this encounter.  No orders of the defined types were placed in this encounter.     Procedures: No procedures performed   Clinical Data: No additional findings.  Objective: Vital Signs: There were no vitals taken for this visit.  Physical Exam:   Constitutional: Patient appears well-developed HEENT:  Head: Normocephalic Eyes:EOM are  normal Neck: Normal range of motion Cardiovascular: Normal rate Pulmonary/chest: Effort normal Neurologic: Patient is alert Skin: Skin is warm Psychiatric: Patient has normal mood and affect    Ortho Exam: Ortho exam demonstrates full active and passive range of motion of the knee.  Palpable cyst present with 2 lobes around the iliotibial band 1 superior 1 inferior.  Trace knee effusion.  Collateral and cruciate ligaments are stable.  Ankle dorsiflexion strength intact.  Pedal pulses palpable.  No other masses lymphadenopathy or skin changes noted in that right knee region.  Specialty Comments:  No specialty comments available.  Imaging: No results found.   PMFS History: Patient Active Problem List   Diagnosis Date Noted  . Atrophy of quadriceps femoris muscle 01/30/2019  . GERD (gastroesophageal reflux disease) 12/24/2017  . Groin rash 12/24/2017  . Benign prostatic hyperplasia with incomplete bladder emptying 10/23/2017  . Positive fecal occult blood test 10/23/2017  . Vocal process granuloma 03/01/2017  . Laryngopharyngeal reflux (LPR) 03/01/2017  . Seborrheic dermatitis of scalp 08/16/2016  . Muscle cramps 05/27/2015  . Leg pain, lateral, right 05/27/2015  . AV block, 1st degree 02/10/2013  . DM (diabetes mellitus) (HCC) 02/10/2013  . Hyperlipidemia 02/25/2007  . BACK PAIN, LUMBAR, CHRONIC 05/31/2006   Past Medical History:  Diagnosis Date  . DM (diabetes mellitus), type 2, uncontrolled (HCC) 02/10/2013  . GERD (gastroesophageal reflux disease)     Family  History  Problem Relation Age of Onset  . Breast cancer Sister   . Breast cancer Sister   . Uterine cancer Sister   . Diabetes Other   . Colon cancer Neg Hx   . Esophageal cancer Neg Hx   . Rectal cancer Neg Hx   . Stomach cancer Neg Hx   . Liver cancer Neg Hx     Past Surgical History:  Procedure Laterality Date  . NO PAST SURGERIES     Social History   Occupational History  . Occupation: Maintenance   Tobacco Use  . Smoking status: Never Smoker  . Smokeless tobacco: Never Used  Vaping Use  . Vaping Use: Never used  Substance and Sexual Activity  . Alcohol use: Yes    Comment: socially  . Drug use: No  . Sexual activity: Not on file

## 2020-04-26 ENCOUNTER — Ambulatory Visit (INDEPENDENT_AMBULATORY_CARE_PROVIDER_SITE_OTHER): Payer: Medicare Other | Admitting: Nurse Practitioner

## 2020-04-26 ENCOUNTER — Other Ambulatory Visit: Payer: Self-pay

## 2020-04-26 ENCOUNTER — Encounter: Payer: Self-pay | Admitting: Nurse Practitioner

## 2020-04-26 VITALS — BP 124/76 | HR 107 | Temp 97.0°F | Ht 64.0 in | Wt 160.4 lb

## 2020-04-26 DIAGNOSIS — Z0000 Encounter for general adult medical examination without abnormal findings: Secondary | ICD-10-CM | POA: Diagnosis not present

## 2020-04-26 DIAGNOSIS — R3914 Feeling of incomplete bladder emptying: Secondary | ICD-10-CM

## 2020-04-26 DIAGNOSIS — L570 Actinic keratosis: Secondary | ICD-10-CM | POA: Diagnosis not present

## 2020-04-26 DIAGNOSIS — N401 Enlarged prostate with lower urinary tract symptoms: Secondary | ICD-10-CM

## 2020-04-26 DIAGNOSIS — E1141 Type 2 diabetes mellitus with diabetic mononeuropathy: Secondary | ICD-10-CM

## 2020-04-26 LAB — MICROALBUMIN / CREATININE URINE RATIO
Creatinine,U: 140.4 mg/dL
Microalb Creat Ratio: 0.6 mg/g (ref 0.0–30.0)
Microalb, Ur: 0.8 mg/dL (ref 0.0–1.9)

## 2020-04-26 LAB — POCT GLYCOSYLATED HEMOGLOBIN (HGB A1C): Hemoglobin A1C: 6.7 % — AB (ref 4.0–5.6)

## 2020-04-26 NOTE — Progress Notes (Signed)
Subjective:    Patient ID: Asa Baudoin, male    DOB: 06/20/1955, 64 y.o.   MRN: [id number]  Patient presents today for Welcome to Medicare visit and DM f/up  HPI DM (diabetes mellitus) ([place]) Controlled with Hgb A1c of 6.7 today Home glucose 90-150 (fasting) Collected urine microalbumin Normal foot exam. BP at goal  Continue metformin and glipizide  Benign prostatic hyperplasia with incomplete bladder emptying No nocturia or hematuria or frequency. stable with use of flomax at HS  PSA completed 04/24/2019 (normal)  Groin rash Chronic, no change No improvement with pimecrolimus 1% Shave biopsy done 2019: SUPERFICIAL KERATOTIC SQUAMOUS EPITHELIUM. - PAS IS NEGATIVE FOR FUNGAL ORGANISMS.  Entered referral to dermatology Care Team: Ophthalmology: [facility] Orthopedist: Dr. Nicki Reaper PCP: Wilfred Lacy, NP  Sexual History (orientation,birth control, marital status, STD):married, sexually active.  Depression/Suicide: Depression screen [facility] 2/9 04/26/2020 07/27/2019 04/24/2019 08/20/2018 06/25/2018 06/18/2017  Decreased Interest 0 0 0 0 0 0  Down, Depressed, Hopeless 0 0 0 0 0 0  PHQ - 2 Score 0 0 0 0 0 0   Vision:use of corrective lens  Hearing Screening   '125Hz'  '250Hz'  '500Hz'  '1000Hz'  '2000Hz'  '3000Hz'  '4000Hz'  '6000Hz'  '8000Hz'   Right ear:   40 20 40      Left ear:   20 20 40        Visual Acuity Screening   Right eye Left eye Both eyes  Without correction:     With correction: '20/20 20/20 20/20 '    Mini-Cog - 04/26/20 1432    Normal clock drawing test? yes    How many words correct? 3;2          Dental:up to date  Immunizations: (TDAP, Hep C screen, Pneumovax, Influenza, zoster)  Health Maintenance  Topic Date Due  . Flu Shot  01/10/2020  . Urine Protein Check  01/21/2020  . HIV Screening  04/26/2021*  . Eye exam for diabetics  05/24/2020  . Hemoglobin A1C  10/24/2020  . Complete foot exam   04/26/2021  . Tetanus Vaccine  06/14/2024  . Colon Cancer  Screening  06/25/2026  . COVID-19 Vaccine  Completed  .  Hepatitis C: One time screening is recommended by Center for Disease Control  (CDC) for  adults born from [birth year] through 1966.   Completed  . Pneumonia vaccines  Completed  *Topic was postponed. The date shown is not the original due date.   Diet:heart healthy and low carb  Weight:  Wt Readings from Last 3 Encounters:  04/26/20 160 lb 6.4 oz (72.8 kg)  01/28/20 158 lb (71.7 kg)  01/25/20 158 lb (71.7 kg)   Fall Risk: Fall Risk  04/26/2020 04/26/2020 07/27/2019 04/24/2019 08/20/2018 06/25/2018 06/18/2017  Falls in the past year? 0 0 0 1 0 0 No  Number falls in past yr: 0 0 - 0 - - -  Comment - - - 1 mo ago - - -  Injury with Fall? 0 0 - 1 - - -  Comment - - - finger swelling on right hand - - -  Risk for fall due to : No Fall Risks - - - - - -  Follow up Falls evaluation completed - - - - - -   Advanced Directive: Advanced Directives 04/26/2020  Does Patient Have a Medical Advance Directive? No  Would patient like information on creating a medical advance directive? No - Patient declined    Medications and allergies reviewed with patient and updated if appropriate.  Patient Active  Problem List   Diagnosis Date Noted  . Atrophy of quadriceps femoris muscle 01/30/2019  . GERD (gastroesophageal reflux disease) 12/24/2017  . Groin rash 12/24/2017  . Benign prostatic hyperplasia with incomplete bladder emptying 10/23/2017  . Positive fecal occult blood test 10/23/2017  . Vocal process granuloma 03/01/2017  . Laryngopharyngeal reflux (LPR) 03/01/2017  . Seborrheic dermatitis of scalp 08/16/2016  . Muscle cramps 05/27/2015  . Leg pain, lateral, right 05/27/2015  . AV block, 1st degree 02/10/2013  . DM (diabetes mellitus) ([place]) 02/10/2013  . Hyperlipidemia 02/25/2007  . BACK PAIN, LUMBAR, CHRONIC 05/31/2006    Current Outpatient Medications on File Prior to Visit  Medication Sig Dispense Refill  . atorvastatin (LIPITOR) 20 MG  tablet Take 1 tablet (20 mg total) by mouth daily at 6 PM. 90 tablet 1  . blood glucose meter kit and supplies KIT Dispense based on patient and insurance preference. Use before breakfast and at bedtime. ICD E11.9 1 each 0  . chlorhexidine (PERIDEX) 0.12 % solution 15 mLs 2 (two) times daily.    . Cholecalciferol (VITAMIN D3 PO) Take by mouth.    Marland Kitchen glipiZIDE (GLUCOTROL XL) 5 MG 24 hr tablet Take 1 tablet (5 mg total) by mouth daily with breakfast. 90 tablet 1  . glucose blood (ONETOUCH ULTRA) test strip USE 1 STRIP TO CHECK GLUCOSE DAILY 100 each 3  . ibuprofen (ADVIL) 800 MG tablet Take 1 tablet (800 mg total) by mouth every 8 (eight) hours as needed. 90 tablet 0  . metFORMIN (GLUCOPHAGE) 500 MG tablet Take 1 tablet (500 mg total) by mouth 2 (two) times daily with a meal. 180 tablet 3  . Multiple Vitamin (MULTIVITAMIN) tablet Take 1 tablet by mouth daily.    Marland Kitchen omeprazole (PRILOSEC) 20 MG capsule Take 1 capsule (20 mg total) by mouth daily. 90 capsule 1  . ONETOUCH DELICA LANCETS FINE MISC     . tamsulosin (FLOMAX) 0.4 MG CAPS capsule Take 1 capsule (0.4 mg total) by mouth daily. 90 capsule 3  . ZINC SULFATE OP Apply to eye.     Current Facility-Administered Medications on File Prior to Visit  Medication Dose Route Frequency Provider Last Rate Last Admin  . 0.9 %  sodium chloride infusion  500 mL Intravenous Continuous Milus Banister, MD        Past Medical History:  Diagnosis Date  . DM (diabetes mellitus), type 2, uncontrolled ([place]) 02/10/2013  . GERD (gastroesophageal reflux disease)     Past Surgical History:  Procedure Laterality Date  . NO PAST SURGERIES      Social History   Socioeconomic History  . Marital status: Married    Spouse name: Not on file  . Number of children: 3  . Years of education: 72  . Highest education level: Not on file  Occupational History  . Occupation: Maintenance  Tobacco Use  . Smoking status: Never Smoker  . Smokeless tobacco: Never Used   Vaping Use  . Vaping Use: Never used  Substance and Sexual Activity  . Alcohol use: Yes    Comment: socially  . Drug use: No  . Sexual activity: Not on file  Other Topics Concern  . Not on file  Social History Narrative  . Not on file   Social Determinants of Health   Financial Resource Strain:   . Difficulty of Paying Living Expenses: Not on file  Food Insecurity:   . Worried About Charity fundraiser in the Last Year: Not on  file  . [place] in the Last Year: Not on file  Transportation Needs:   . Lack of Transportation (Medical): Not on file  . Lack of Transportation (Non-Medical): Not on file  Physical Activity:   . Days of Exercise per Week: Not on file  . Minutes of Exercise per Session: Not on file  Stress:   . Feeling of Stress : Not on file  Social Connections:   . Frequency of Communication with Friends and Family: Not on file  . Frequency of Social Gatherings with Friends and Family: Not on file  . Attends Religious Services: Not on file  . Active Member of Clubs or Organizations: Not on file  . Attends Archivist Meetings: Not on file  . Marital Status: Not on file    Family History  Problem Relation Age of Onset  . Breast cancer Sister   . Breast cancer Sister   . Uterine cancer Sister   . Diabetes Other   . Colon cancer Neg Hx   . Esophageal cancer Neg Hx   . Rectal cancer Neg Hx   . Stomach cancer Neg Hx   . Liver cancer Neg Hx       Objective:   Vitals:   04/26/20 1330  BP: 124/76  Pulse: (!) 107  Temp: (!) 97 F (36.1 C)  SpO2: 97%   Body mass index is 27.53 kg/m.  Physical Examination:  Physical Exam Vitals reviewed.  Cardiovascular:     Rate and Rhythm: Normal rate and regular rhythm.     Pulses: Normal pulses.     Heart sounds: Normal heart sounds.  Pulmonary:     Effort: Pulmonary effort is normal.     Breath sounds: Normal breath sounds.  Musculoskeletal:     Right lower leg: No edema.     Left lower  leg: No edema.  Skin:    General: Skin is dry.  Neurological:     Mental Status: He is alert and oriented to person, place, and time.    ASSESSMENT and PLAN: This visit occurred during the SARS-CoV-2 public health emergency.  Safety protocols were in place, including screening questions prior to the visit, additional usage of staff PPE, and extensive cleaning of exam room while observing appropriate contact time as indicated for disinfecting solutions.   Gervis was seen today for follow-up.  Diagnoses and all orders for this visit:  Welcome to Medicare preventive visit  Type 2 diabetes mellitus with diabetic mononeuropathy, without long-term current use of insulin ([place]) -     Microalbumin / creatinine urine ratio -     POCT glycosylated hemoglobin (Hb A1C)  Keratotic lesion -     Ambulatory referral to Dermatology  Benign prostatic hyperplasia with incomplete bladder emptying      Problem List Items Addressed This Visit      Endocrine   DM (diabetes mellitus) ([place])    Controlled with Hgb A1c of 6.7 today Home glucose 90-150 (fasting) Collected urine microalbumin Normal foot exam. BP at goal  Continue metformin and glipizide      Relevant Orders   Microalbumin / creatinine urine ratio   POCT glycosylated hemoglobin (Hb A1C) (Completed)     Musculoskeletal and Integument   Groin rash    Chronic, no change No improvement with pimecrolimus 1% Shave biopsy done 2019: SUPERFICIAL KERATOTIC SQUAMOUS EPITHELIUM. - PAS IS NEGATIVE FOR FUNGAL ORGANISMS.  Entered referral to dermatology        Genitourinary  Benign prostatic hyperplasia with incomplete bladder emptying    No nocturia or hematuria or frequency. stable with use of flomax at HS  PSA completed 04/24/2019 (normal)       Other Visit Diagnoses    Welcome to Medicare preventive visit    -  Primary      Follow up: Return in about 6 months (around 10/24/2020) for DM and HTN, hyperlipidemia  (fasting).  Wilfred Lacy, NP

## 2020-04-26 NOTE — Assessment & Plan Note (Signed)
No nocturia or hematuria or frequency. stable with use of flomax at HS  PSA completed 04/24/2019 (normal)

## 2020-04-26 NOTE — Patient Instructions (Addendum)
Go to lab for urine collection.  Edward Mcgee , Thank you for taking time to come for your Medicare Wellness Visit. I appreciate your ongoing commitment to your health goals. Please review the following plan we discussed and let me know if I can assist you in the future.   These are the goals we discussed: Get living will and health care power of attorney form completed and signed if you change your mind.   This is a list of the screening recommended for you and due dates:  Health Maintenance  Topic Date Due  . HIV Screening  Never done  . Flu Shot  01/10/2020  . Urine Protein Check  01/21/2020  . Complete foot exam   04/23/2020  . Eye exam for diabetics  05/24/2020  . Hemoglobin A1C  07/27/2020  . Tetanus Vaccine  06/14/2024  . Colon Cancer Screening  06/25/2026  . COVID-19 Vaccine  Completed  .  Hepatitis C: One time screening is recommended by Center for Disease Control  (CDC) for  adults born from [birth year] through 1966.   Completed  . Pneumonia vaccines  Completed

## 2020-04-26 NOTE — Assessment & Plan Note (Signed)
Controlled with Hgb A1c of 6.7 today Home glucose 90-150 (fasting) Collected urine microalbumin Normal foot exam. BP at goal  Continue metformin and glipizide

## 2020-04-26 NOTE — Assessment & Plan Note (Addendum)
Chronic, no change No improvement with pimecrolimus 1% Shave biopsy done 2019: SUPERFICIAL KERATOTIC SQUAMOUS EPITHELIUM. - PAS IS NEGATIVE FOR FUNGAL ORGANISMS.  Entered referral to dermatology

## 2020-05-25 DIAGNOSIS — H33103 Unspecified retinoschisis, bilateral: Secondary | ICD-10-CM | POA: Diagnosis not present

## 2020-05-25 DIAGNOSIS — H524 Presbyopia: Secondary | ICD-10-CM | POA: Diagnosis not present

## 2020-05-25 DIAGNOSIS — E119 Type 2 diabetes mellitus without complications: Secondary | ICD-10-CM | POA: Diagnosis not present

## 2020-05-25 DIAGNOSIS — H2513 Age-related nuclear cataract, bilateral: Secondary | ICD-10-CM | POA: Diagnosis not present

## 2020-05-25 LAB — HM DIABETES EYE EXAM

## 2020-05-26 ENCOUNTER — Telehealth: Payer: Self-pay

## 2020-05-26 DIAGNOSIS — E1141 Type 2 diabetes mellitus with diabetic mononeuropathy: Secondary | ICD-10-CM

## 2020-05-26 MED ORDER — ONETOUCH ULTRA VI STRP: ORAL_STRIP | 3 refills | Status: DC

## 2020-05-26 NOTE — Telephone Encounter (Signed)
Pt's wife calling to ask if pt can get a refill on test strips.  Pt is checking BS, BID.  Rx sent to pharmacy on file.

## 2020-06-27 ENCOUNTER — Other Ambulatory Visit: Payer: Self-pay | Admitting: Nurse Practitioner

## 2020-06-27 DIAGNOSIS — N401 Enlarged prostate with lower urinary tract symptoms: Secondary | ICD-10-CM

## 2020-07-04 DIAGNOSIS — L900 Lichen sclerosus et atrophicus: Secondary | ICD-10-CM | POA: Diagnosis not present

## 2020-07-28 ENCOUNTER — Other Ambulatory Visit: Payer: Self-pay | Admitting: Nurse Practitioner

## 2020-07-28 DIAGNOSIS — E782 Mixed hyperlipidemia: Secondary | ICD-10-CM

## 2020-07-28 DIAGNOSIS — K2100 Gastro-esophageal reflux disease with esophagitis, without bleeding: Secondary | ICD-10-CM

## 2020-07-28 DIAGNOSIS — E1165 Type 2 diabetes mellitus with hyperglycemia: Secondary | ICD-10-CM

## 2020-10-28 ENCOUNTER — Other Ambulatory Visit: Payer: Self-pay | Admitting: Nurse Practitioner

## 2020-10-28 DIAGNOSIS — K2100 Gastro-esophageal reflux disease with esophagitis, without bleeding: Secondary | ICD-10-CM

## 2020-10-28 DIAGNOSIS — E782 Mixed hyperlipidemia: Secondary | ICD-10-CM

## 2020-10-28 DIAGNOSIS — E1165 Type 2 diabetes mellitus with hyperglycemia: Secondary | ICD-10-CM

## 2020-11-02 ENCOUNTER — Ambulatory Visit: Payer: Medicare Other | Admitting: Nurse Practitioner

## 2020-11-02 ENCOUNTER — Other Ambulatory Visit: Payer: Self-pay

## 2020-11-10 ENCOUNTER — Encounter: Payer: Self-pay | Admitting: Orthopedic Surgery

## 2020-11-10 ENCOUNTER — Ambulatory Visit: Payer: Self-pay

## 2020-11-10 ENCOUNTER — Ambulatory Visit: Payer: Medicare Other | Admitting: Orthopedic Surgery

## 2020-11-10 DIAGNOSIS — R2241 Localized swelling, mass and lump, right lower limb: Secondary | ICD-10-CM

## 2020-11-10 DIAGNOSIS — M6740 Ganglion, unspecified site: Secondary | ICD-10-CM

## 2020-11-10 NOTE — Progress Notes (Signed)
Office Visit Note   Patient: Edward Mcgee           Date of Birth: 1955-10-29           MRN: [id number] Visit Date: 11/10/2020 Requested by: Anne Ng, NP [address] [place],  [place] [address] PCP: Anne Ng, NP  Subjective: Chief Complaint  Patient presents with  . Right Knee - Pain    HPI: Patient presents for follow-up of right knee pain.  He was last seen several months ago.  Had right knee lateral meniscal tear as well as small meniscal cyst.  He was doing reasonably well and quested no intervention last clinic visit.  However the cyst has become larger and it has become more painful for him especially at night.  Denies any interval history of injury or trauma.  Would like to have intervention today which is not surgery to try to help his pain.  Has been taking some over-the-counter medication without much relief.              ROS: All systems reviewed are negative as they relate to the chief complaint within the history of present illness.  Patient denies  fevers or chills.   Assessment & Plan: Visit Diagnoses:  1. Mass of right knee     Plan: Impression is enlargement of right knee lateral meniscal cyst.  Discussed with him extensively the risk and benefits of surgical and nonsurgical intervention.  Nonsurgical intervention would consist of knee injection as well as Synvisc injection and aspiration.  I think that would give him temporary relief.  Cyst may recur.  Decision today is for cyst aspiration as well as knee aspiration and injection.  We will see him back in 8 weeks for clinical recheck and decision for or against surgical intervention at that time.  Follow-Up Instructions: Return in about 8 weeks (around 01/05/2021).   Orders:  Orders Placed This Encounter  Procedures  . US Guided Needle Placement - No Linked Charges   No orders of the defined types were placed in this encounter.     Procedures: Large Joint Inj: R knee on  11/11/2020 7:29 AM Indications: diagnostic evaluation, joint swelling and pain Details: 18 G 1.5 in needle, superolateral approach  Arthrogram: No  Medications: 5 mL lidocaine 1 %; 4 mL bupivacaine 0.25 %; 6 mg betamethasone acetate-betamethasone sodium phosphate 6 (3-3) MG/ML Outcome: tolerated well, no immediate complications Procedure, treatment alternatives, risks and benefits explained, specific risks discussed. Consent was given by the patient. Immediately prior to procedure a time out was called to verify the correct patient, procedure, equipment, support staff and site/side marked as required. Patient was prepped and draped in the usual sterile fashion.     Right knee ganglion cyst also aspirated under ultrasound guidance.  We got about 10 cc of gelatinous fluid out.  Injection into the knee joint performed through the same aspiration site.  Clinical Data: No additional findings.  Objective: Vital Signs: There were no vitals taken for this visit.  Physical Exam:   Constitutional: Patient appears well-developed HEENT:  Head: Normocephalic Eyes:EOM are normal Neck: Normal range of motion Cardiovascular: Normal rate Pulmonary/chest: Effort normal Neurologic: Patient is alert Skin: Skin is warm Psychiatric: Patient has normal mood and affect    Ortho Exam: Ortho exam demonstrates normal gait alignment.  Pedal pulse palpable.  Ankle dorsiflexion plantarflexion intact on the right-hand side with no paresthesias.  Does have a bilobed cyst spanning the iliotibial band around  the level of the knee joint.  Collateral crucial ligaments are stable.  Mild effusion is present.  No other masses lymphadenopathy or skin changes noted in that medial aspect of the right knee.  Range of motion is full.  Specialty Comments:  No specialty comments available.  Imaging: No results found.   PMFS History: Patient Active Problem List   Diagnosis Date Noted  . Atrophy of quadriceps femoris  muscle 01/30/2019  . GERD (gastroesophageal reflux disease) 12/24/2017  . Groin rash 12/24/2017  . Benign prostatic hyperplasia with incomplete bladder emptying 10/23/2017  . Positive fecal occult blood test 10/23/2017  . Vocal process granuloma 03/01/2017  . Laryngopharyngeal reflux (LPR) 03/01/2017  . Seborrheic dermatitis of scalp 08/16/2016  . Muscle cramps 05/27/2015  . Leg pain, lateral, right 05/27/2015  . AV block, 1st degree 02/10/2013  . DM (diabetes mellitus) ([facility]) 02/10/2013  . Hyperlipidemia 02/25/2007  . BACK PAIN, LUMBAR, CHRONIC 05/31/2006   Past Medical History:  Diagnosis Date  . DM (diabetes mellitus), type 2, uncontrolled ([facility]) 02/10/2013  . GERD (gastroesophageal reflux disease)     Family History  Problem Relation Age of Onset  . Breast cancer Sister   . Breast cancer Sister   . Uterine cancer Sister   . Diabetes Other   . Colon cancer Neg Hx   . Esophageal cancer Neg Hx   . Rectal cancer Neg Hx   . Stomach cancer Neg Hx   . Liver cancer Neg Hx     Past Surgical History:  Procedure Laterality Date  . NO PAST SURGERIES     Social History   Occupational History  . Occupation: Maintenance  Tobacco Use  . Smoking status: Never Smoker  . Smokeless tobacco: Never Used  Vaping Use  . Vaping Use: Never used  Substance and Sexual Activity  . Alcohol use: Yes    Comment: socially  . Drug use: No  . Sexual activity: Not on file

## 2020-11-11 DIAGNOSIS — M67461 Ganglion, right knee: Secondary | ICD-10-CM | POA: Diagnosis not present

## 2020-11-11 MED ORDER — LIDOCAINE HCL 1 % IJ SOLN
5.0000 mL | INTRAMUSCULAR | Status: AC | PRN
  Administered 2020-11-11: 5 mL

## 2020-11-11 MED ORDER — BUPIVACAINE HCL 0.25 % IJ SOLN
4.0000 mL | INTRAMUSCULAR | Status: AC | PRN
  Administered 2020-11-11: 4 mL via INTRA_ARTICULAR

## 2020-11-11 MED ORDER — BETAMETHASONE SOD PHOS & ACET 6 (3-3) MG/ML IJ SUSP
6.0000 mg | INTRAMUSCULAR | Status: AC | PRN
  Administered 2020-11-11: 6 mg via INTRA_ARTICULAR

## 2020-11-22 ENCOUNTER — Other Ambulatory Visit: Payer: Self-pay

## 2020-11-23 ENCOUNTER — Ambulatory Visit (INDEPENDENT_AMBULATORY_CARE_PROVIDER_SITE_OTHER): Payer: Medicare Other | Admitting: Nurse Practitioner

## 2020-11-23 ENCOUNTER — Encounter: Payer: Self-pay | Admitting: Nurse Practitioner

## 2020-11-23 VITALS — BP 118/64 | HR 85 | Temp 98.3°F | Ht 64.0 in | Wt 158.4 lb

## 2020-11-23 DIAGNOSIS — R110 Nausea: Secondary | ICD-10-CM | POA: Diagnosis not present

## 2020-11-23 DIAGNOSIS — E1165 Type 2 diabetes mellitus with hyperglycemia: Secondary | ICD-10-CM

## 2020-11-23 DIAGNOSIS — E1141 Type 2 diabetes mellitus with diabetic mononeuropathy: Secondary | ICD-10-CM

## 2020-11-23 DIAGNOSIS — K219 Gastro-esophageal reflux disease without esophagitis: Secondary | ICD-10-CM | POA: Diagnosis not present

## 2020-11-23 DIAGNOSIS — R1013 Epigastric pain: Secondary | ICD-10-CM

## 2020-11-23 DIAGNOSIS — E782 Mixed hyperlipidemia: Secondary | ICD-10-CM | POA: Diagnosis not present

## 2020-11-23 NOTE — Assessment & Plan Note (Addendum)
PM glucose elevated x 2.5weeks after joint injection with corticosteroid 3weeks ago. In last 1week AM and PM glucose <150. Current use of glipizide and metformin.  Repeat HgbA1c today: controlled DM Hold metformin due to bloating, dyspepsia and nausea Continue glipizide

## 2020-11-23 NOTE — Progress Notes (Signed)
Subjective:  Patient ID: Edward Mcgee, male    DOB: [birth year]  Age: 65 y.o. MRN: [id number]  CC: Follow-up (6 month follow up DM)   GI Problem The primary symptoms include abdominal pain and nausea. Primary symptoms do not include fever, weight loss, fatigue, vomiting, diarrhea, melena, hematemesis, jaundice, hematochezia or dysuria. The illness began more than 7 days ago (3weeks ago). The onset was gradual. The problem has not changed since onset. The illness is also significant for anorexia and bloating. The illness does not include chills, dysphagia, odynophagia, constipation, tenesmus, back pain or itching. Significant associated medical issues include GERD. Associated medical issues do not include inflammatory bowel disease, gallstones, liver disease, alcohol abuse, PUD, gastric bypass, bowel resection, irritable bowel syndrome, hemorrhoids or diverticulitis.  Hx of fatty liver per CT ABD and hx of diverticulosis per colonoscopy completed 2018  GERD (gastroesophageal reflux disease) Stable GERD symptoms with PPI  DM (diabetes mellitus) ([place]) PM glucose elevated x 2.5weeks after joint injection with corticosteroid 3weeks ago. In last 1week AM and PM glucose <150. Current use of glipizide and metformin.  Repeat HgbA1c today: controlled DM Hold metformin due to bloating, dyspepsia and nausea Continue glipizide  Hyperlipidemia Repeat lipid panel: elevated triglyceride. It is important to maintain lowcarb/low fat diet. 22.3% ASCVD risk Increase lipitor to 79m Repeat in 343month(fasting). Lipid Panel     Component Value Date/Time   CHOL 183 11/23/2020 1443   TRIG 242.0 (H) 11/23/2020 1443   HDL 56.60 11/23/2020 1443   CHOLHDL 3 11/23/2020 1443   VLDL 48.4 (H) 11/23/2020 1443   LDLCALC 73 01/25/2020 0840   LDLDIRECT 97.0 11/23/2020 1443    Wt Readings from Last 3 Encounters:  11/23/20 158 lb 6.4 oz (71.8 kg)  04/26/20 160 lb 6.4 oz (72.8 kg)  01/28/20 158 lb (71.7 kg)     Reviewed past Medical, Social and Family history today.  Outpatient Medications Prior to Visit  Medication Sig Dispense Refill   acetaminophen (TYLENOL) 325 MG tablet Take 650 mg by mouth every 6 (six) hours as needed.     blood glucose meter kit and supplies KIT Dispense based on patient and insurance preference. Use before breakfast and at bedtime. ICD E11.9 1 each 0   Cholecalciferol (VITAMIN D3 PO) Take by mouth.     glucose blood (ONETOUCH ULTRA) test strip USE 2 test strips,  TO CHECK GLUCOSE BID 100 each 3   metFORMIN (GLUCOPHAGE) 500 MG tablet Take 1 tablet (500 mg total) by mouth 2 (two) times daily with a meal. 180 tablet 3   mometasone (ELOCON) 0.1 % cream SMARTSIG:Sparingly Topical Twice Daily     omeprazole (PRILOSEC) 20 MG capsule Take 1 capsule by mouth once daily 90 capsule 0   ONETOUCH DELICA LANCETS FINE MISC      tamsulosin (FLOMAX) 0.4 MG CAPS capsule Take 1 capsule (0.4 mg total) by mouth daily. 90 capsule 3   vitamin E 1000 UNIT capsule Take 1,000 Units by mouth daily.     ZINC SULFATE OP Apply to eye.     atorvastatin (LIPITOR) 20 MG tablet TAKE 1 TABLET BY MOUTH ONCE DAILY AT  6PM 90 tablet 0   chlorhexidine (PERIDEX) 0.12 % solution 15 mLs 2 (two) times daily.     glipiZIDE (GLUCOTROL XL) 5 MG 24 hr tablet Take 1 tablet by mouth once daily with breakfast 90 tablet 0   ibuprofen (ADVIL) 800 MG tablet Take 1 tablet (800 mg total) by mouth every 8 (eight)  hours as needed. 90 tablet 0   Multiple Vitamin (MULTIVITAMIN) tablet Take 1 tablet by mouth daily.     Facility-Administered Medications Prior to Visit  Medication Dose Route Frequency Provider Last Rate Last Admin   0.9 %  sodium chloride infusion  500 mL Intravenous Continuous Milus Banister, MD       ROS See HPI  Objective:  BP 118/64   Pulse 85   Temp 98.3 F (36.8 C)   Ht 5' 4" (1.626 m)   Wt 158 lb 6.4 oz (71.8 kg)   SpO2 96%   BMI 27.19 kg/m   Physical Exam Constitutional:      General: He  is not in acute distress. Cardiovascular:     Rate and Rhythm: Normal rate and regular rhythm.     Pulses: Normal pulses.     Heart sounds: Normal heart sounds.  Pulmonary:     Effort: Pulmonary effort is normal.     Breath sounds: Normal breath sounds.  Abdominal:     General: Bowel sounds are normal. There is distension.     Palpations: Abdomen is soft. There is no mass.     Tenderness: There is abdominal tenderness. There is no right CVA tenderness, left CVA tenderness, guarding or rebound.     Hernia: No hernia is present.  Skin:    General: Skin is warm and dry.     Coloration: Skin is not jaundiced.  Neurological:     Mental Status: He is alert and oriented to person, place, and time.  Psychiatric:        Mood and Affect: Mood normal.        Behavior: Behavior normal.        Thought Content: Thought content normal.    Assessment & Plan:  This visit occurred during the SARS-CoV-2 public health emergency.  Safety protocols were in place, including screening questions prior to the visit, additional usage of staff PPE, and extensive cleaning of exam room while observing appropriate contact time as indicated for disinfecting solutions.   Edward Mcgee was seen today for follow-up.  Diagnoses and all orders for this visit:  Type 2 diabetes mellitus with diabetic mononeuropathy, without long-term current use of insulin (HCC) -     Hemoglobin A1c  Mixed hyperlipidemia -     Comprehensive metabolic panel -     Lipid panel -     LDL cholesterol, direct -     Lipid panel; Future -     atorvastatin (LIPITOR) 40 MG tablet; Take 1 tablet (40 mg total) by mouth daily.  Nausea -     US Abdomen Limited RUQ (LIVER/GB); Future -     Lipase  Epigastric pain -     US Abdomen Limited RUQ (LIVER/GB); Future -     Comprehensive metabolic panel -     CBC with Differential/Platelet -     Lipase  Gastroesophageal reflux disease without esophagitis  Type 2 diabetes mellitus with  hyperglycemia, without long-term current use of insulin (HCC) -     glipiZIDE (GLUCOTROL XL) 5 MG 24 hr tablet; Take 1 tablet (5 mg total) by mouth daily with breakfast.  Hold lipitor and metformin till we get lab results and ABD [place] completed.  Problem List Items Addressed This Visit       Digestive   GERD (gastroesophageal reflux disease)    Stable GERD symptoms with PPI         Endocrine   DM (diabetes mellitus) ([place]) -  Primary    PM glucose elevated x 2.5weeks after joint injection with corticosteroid 3weeks ago. In last 1week AM and PM glucose <150. Current use of glipizide and metformin.  Repeat HgbA1c today: controlled DM Hold metformin due to bloating, dyspepsia and nausea Continue glipizide       Relevant Medications   atorvastatin (LIPITOR) 40 MG tablet   glipiZIDE (GLUCOTROL XL) 5 MG 24 hr tablet   Other Relevant Orders   Hemoglobin A1c (Completed)     Other   Hyperlipidemia    Repeat lipid panel: elevated triglyceride. It is important to maintain lowcarb/low fat diet. 22.3% ASCVD risk Increase lipitor to 31m Repeat in 328month(fasting). Lipid Panel     Component Value Date/Time   CHOL 183 11/23/2020 1443   TRIG 242.0 (H) 11/23/2020 1443   HDL 56.60 11/23/2020 1443   CHOLHDL 3 11/23/2020 1443   VLDL 48.4 (H) 11/23/2020 1443   LDLCALC 73 01/25/2020 0840   LDLDIRECT 97.0 11/23/2020 1443         Relevant Medications   atorvastatin (LIPITOR) 40 MG tablet   Other Relevant Orders   Comprehensive metabolic panel (Completed)   Lipid panel (Completed)   LDL cholesterol, direct (Completed)   Lipid panel   Other Visit Diagnoses     Nausea       Relevant Orders   US[place]oreabdomen Limited RUQ (LIVER/GB)   Lipase (Completed)   Epigastric pain       Relevant Orders   [place]domen Limited RUQ (LIVER/GB)   Comprehensive metabolic panel (Completed)   CBC with Differential/Platelet (Completed)   Lipase (Completed)       Follow-up: Return in about 6 months  (around 05/25/2021) for DM and HTN, hyperlipidemia (3049m).  ChaWilfred LacyP

## 2020-11-23 NOTE — Assessment & Plan Note (Addendum)
Repeat lipid panel: elevated triglyceride. It is important to maintain lowcarb/low fat diet. 22.3% ASCVD risk Increase lipitor to 40mg  Repeat in 67months (fasting). Lipid Panel     Component Value Date/Time   CHOL 183 11/23/2020 1443   TRIG 242.0 (H) 11/23/2020 1443   HDL 56.60 11/23/2020 1443   CHOLHDL 3 11/23/2020 1443   VLDL 48.4 (H) 11/23/2020 1443   LDLCALC 73 01/25/2020 0840   LDLDIRECT 97.0 11/23/2020 1443

## 2020-11-23 NOTE — Assessment & Plan Note (Signed)
Stable GERD symptoms with PPI

## 2020-11-23 NOTE — Patient Instructions (Addendum)
Go to lab for blood draw  You will be contacted to schedule appt for ABD [place].  Maintain low fat diet to help with nausea and abdominal pain.  Hold lipitor while waiting on ABD [place] and lab results

## 2020-11-23 NOTE — Assessment & Plan Note (Deleted)
Stable GERD symptoms with PPI 

## 2020-11-24 LAB — CBC WITH DIFFERENTIAL/PLATELET
Basophils Absolute: 0.1 10*3/uL (ref 0.0–0.1)
Basophils Relative: 0.8 % (ref 0.0–3.0)
Eosinophils Absolute: 0.1 10*3/uL (ref 0.0–0.7)
Eosinophils Relative: 1.9 % (ref 0.0–5.0)
HCT: 42.9 % (ref 39.0–52.0)
Hemoglobin: 14.3 g/dL (ref 13.0–17.0)
Lymphocytes Relative: 28.5 % (ref 12.0–46.0)
Lymphs Abs: 2.1 10*3/uL (ref 0.7–4.0)
MCHC: 33.3 g/dL (ref 30.0–36.0)
MCV: 83.5 fl (ref 78.0–100.0)
Monocytes Absolute: 0.6 10*3/uL (ref 0.1–1.0)
Monocytes Relative: 7.9 % (ref 3.0–12.0)
Neutro Abs: 4.6 10*3/uL (ref 1.4–7.7)
Neutrophils Relative %: 60.9 % (ref 43.0–77.0)
Platelets: 308 10*3/uL (ref 150.0–400.0)
RBC: 5.14 Mil/uL (ref 4.22–5.81)
RDW: 13.1 % (ref 11.5–15.5)
WBC: 7.5 10*3/uL (ref 4.0–10.5)

## 2020-11-24 LAB — COMPREHENSIVE METABOLIC PANEL
ALT: 24 U/L (ref 0–53)
AST: 16 U/L (ref 0–37)
Albumin: 4.6 g/dL (ref 3.5–5.2)
Alkaline Phosphatase: 80 U/L (ref 39–117)
BUN: 19 mg/dL (ref 6–23)
CO2: 29 mEq/L (ref 19–32)
Calcium: 10.1 mg/dL (ref 8.4–10.5)
Chloride: 102 mEq/L (ref 96–112)
Creatinine, Ser: 0.88 mg/dL (ref 0.40–1.50)
GFR: 89.77 mL/min (ref >60.00)
Glucose, Bld: 175 mg/dL — ABNORMAL HIGH (ref 70–99)
Potassium: 4.3 mEq/L (ref 3.5–5.1)
Sodium: 139 mEq/L (ref 135–145)
Total Bilirubin: 0.3 mg/dL (ref 0.2–1.2)
Total Protein: 7.2 g/dL (ref 6.0–8.3)

## 2020-11-24 LAB — LIPID PANEL
Cholesterol: 183 mg/dL (ref 0–200)
HDL: 56.6 mg/dL (ref >39.00)
NonHDL: 126.34
Total CHOL/HDL Ratio: 3
Triglycerides: 242 mg/dL — ABNORMAL HIGH (ref 0.0–149.0)
VLDL: 48.4 mg/dL — ABNORMAL HIGH (ref 0.0–40.0)

## 2020-11-24 LAB — LDL CHOLESTEROL, DIRECT: Direct LDL: 97 mg/dL

## 2020-11-24 LAB — HEMOGLOBIN A1C: Hgb A1c MFr Bld: 7.3 % — ABNORMAL HIGH (ref 4.6–6.5)

## 2020-11-24 LAB — LIPASE: Lipase: 42 U/L (ref 11.0–59.0)

## 2020-11-25 ENCOUNTER — Encounter: Payer: Self-pay | Admitting: Nurse Practitioner

## 2020-11-25 MED ORDER — GLIPIZIDE ER 5 MG PO TB24: 5.0000 mg | ORAL_TABLET | Freq: Every day | ORAL | 3 refills | Status: DC

## 2020-11-25 MED ORDER — ATORVASTATIN CALCIUM 40 MG PO TABS: 40.0000 mg | ORAL_TABLET | Freq: Every day | ORAL | 3 refills | Status: DC

## 2020-12-09 ENCOUNTER — Ambulatory Visit
Admission: RE | Admit: 2020-12-09 | Discharge: 2020-12-09 | Disposition: A | Discharge status: Home / Self Care | Payer: Medicare Other | Source: Ambulatory Visit | Attending: Nurse Practitioner | Admitting: Nurse Practitioner

## 2020-12-09 DIAGNOSIS — R110 Nausea: Secondary | ICD-10-CM

## 2020-12-09 DIAGNOSIS — K760 Fatty (change of) liver, not elsewhere classified: Secondary | ICD-10-CM | POA: Diagnosis not present

## 2020-12-09 DIAGNOSIS — R1013 Epigastric pain: Secondary | ICD-10-CM

## 2021-01-02 ENCOUNTER — Encounter: Payer: Self-pay | Admitting: Family Medicine

## 2021-01-02 ENCOUNTER — Telehealth (INDEPENDENT_AMBULATORY_CARE_PROVIDER_SITE_OTHER): Payer: Medicare Other | Admitting: Family Medicine

## 2021-01-02 VITALS — BP 138/75 | HR 73 | Ht 64.0 in

## 2021-01-02 DIAGNOSIS — R059 Cough, unspecified: Secondary | ICD-10-CM | POA: Diagnosis not present

## 2021-01-02 DIAGNOSIS — U071 COVID-19: Secondary | ICD-10-CM

## 2021-01-02 MED ORDER — BENZONATATE 100 MG PO CAPS: 200.0000 mg | ORAL_CAPSULE | Freq: Two times a day (BID) | ORAL | 0 refills | Status: AC | PRN

## 2021-01-02 MED ORDER — NIRMATRELVIR/RITONAVIR (PAXLOVID)TABLET: 3.0000 | ORAL_TABLET | Freq: Two times a day (BID) | ORAL | 0 refills | Status: AC

## 2021-01-02 NOTE — Progress Notes (Signed)
Virtual Visit via Video Note I connected with Edward Mcgee on 01/02/21 by a video enabled telemedicine application and verified that I am speaking with the correct person using two identifiers.  Location patient: home Location provider:work office Persons participating in the virtual visit: patient, wife,provider  I discussed the limitations of evaluation and management by telemedicine and the availability of in person appointments. The patient expressed understanding and agreed to proceed.  Chief Complaint  Patient presents with   Covid Positive   HPI: Edward Mcgee is a 65 year old male with history of DM 2, GERD, chronic back pain, and hyperlipidemia complaining of 3 days of respiratory symptoms.  Sore throat, fatigue, body aches, nasal congestion, rhinorrhea, nonproductive cough, and mild nausea (not sure if caused by medication).  Negative for fever, chills, anosmia, ageusia, dysphagia,stridor, CP, palpitations, wheezing, dyspnea, abdominal pain, vomiting, changes in bowel habits, urinary symptoms, or skin rash. No known sick contact. He was coming back from the beach the day he started with symptoms.  Home COVID 19 test positive today. COVID-19 vaccination completed + booster x1. He is taking OTC diabetic Tussin and Tylenol. BS 130- 180's, his usual readings.  ROS: See pertinent positives and negatives per HPI.  Past Medical History:  Diagnosis Date   DM (diabetes mellitus), type 2, uncontrolled ([place]) 02/10/2013   GERD (gastroesophageal reflux disease)    Past Surgical History:  Procedure Laterality Date   NO PAST SURGERIES     Family History  Problem Relation Age of Onset   Breast cancer Sister    Breast cancer Sister    Uterine cancer Sister    Diabetes Other    Colon cancer Neg Hx    Esophageal cancer Neg Hx    Rectal cancer Neg Hx    Stomach cancer Neg Hx    Liver cancer Neg Hx    Social History   Socioeconomic History   Marital status: Married    Spouse name: Not  on file   Number of children: 3   Years of education: 11   Highest education level: Not on file  Occupational History   Occupation: Maintenance  Tobacco Use   Smoking status: Never   Smokeless tobacco: Never  Vaping Use   Vaping Use: Never used  Substance and Sexual Activity   Alcohol use: Yes    Comment: socially   Drug use: No   Sexual activity: Not on file  Other Topics Concern   Not on file  Social History Narrative   Not on file   Social Determinants of Health   Financial Resource Strain: Not on file  Food Insecurity: Not on file  Transportation Needs: Not on file  Physical Activity: Not on file  Stress: Not on file  Social Connections: Not on file  Intimate Partner Violence: Not on file    Current Outpatient Medications:    acetaminophen (TYLENOL) 325 MG tablet, Take 650 mg by mouth every 6 (six) hours as needed., Disp: , Rfl:    atorvastatin (LIPITOR) 40 MG tablet, Take 1 tablet (40 mg total) by mouth daily., Disp: 90 tablet, Rfl: 3   blood glucose meter kit and supplies KIT, Dispense based on patient and insurance preference. Use before breakfast and at bedtime. ICD E11.9, Disp: 1 each, Rfl: 0   Cholecalciferol (VITAMIN D3 PO), Take by mouth., Disp: , Rfl:    glipiZIDE (GLUCOTROL XL) 5 MG 24 hr tablet, Take 1 tablet (5 mg total) by mouth daily with breakfast., Disp: 90 tablet, Rfl: 3  glucose blood (ONETOUCH ULTRA) test strip, USE 2 test strips,  TO CHECK GLUCOSE BID, Disp: 100 each, Rfl: 3   mometasone (ELOCON) 0.1 % cream, SMARTSIG:Sparingly Topical Twice Daily, Disp: , Rfl:    omeprazole (PRILOSEC) 20 MG capsule, Take 1 capsule by mouth once daily, Disp: 90 capsule, Rfl: 0   ONETOUCH DELICA LANCETS FINE MISC, , Disp: , Rfl:    tamsulosin (FLOMAX) 0.4 MG CAPS capsule, Take 1 capsule (0.4 mg total) by mouth daily., Disp: 90 capsule, Rfl: 3   vitamin E 1000 UNIT capsule, Take 1,000 Units by mouth daily., Disp: , Rfl:    ZINC SULFATE OP, Apply to eye., Disp: , Rfl:     metFORMIN (GLUCOPHAGE) 500 MG tablet, Take 1 tablet (500 mg total) by mouth 2 (two) times daily with a meal., Disp: 180 tablet, Rfl: 3  Current Facility-Administered Medications:    0.9 %  sodium chloride infusion, 500 mL, Intravenous, Continuous, Milus Banister, MD  EXAMTonette Bihari per patient if applicable:BP 086/57   Pulse 73   Ht '5\' 4"'  (1.626 m)   BMI 27.19 kg/m   GENERAL: alert, oriented, appears well and in no acute distress  HEENT: atraumatic, conjunctiva clear, no obvious abnormalities on inspection of external nose and ears Vesicular lesion right side on soft palate, I can not see it but his wife described it for me.No erythema, uvula is centered.  NECK: normal movements of the head and neck  LUNGS: on inspection no signs of respiratory distress, breathing rate appears normal, no obvious gross SOB, gasping or wheezing  CV: no obvious cyanosis  MS: moves all visible extremities without noticeable abnormality  PSYCH/NEURO: pleasant and cooperative, no obvious depression or anxiety, speech and thought processing grossly intact  ASSESSMENT AND PLAN:  Discussed the following assessment and plan:  COVID-19 virus infection - Plan: nirmatrelvir/ritonavir EUA (PAXLOVID) TABS We discussed diagnosis, possible complications, and treatment options. Recommend repeating COVID-19 test at his pharmacy or a second home test tomorrow. Symptomatic treatment with plenty of fluids, Tylenol 500 mg 3-4 times per day as needed, and rest. For sore throat he can try OTC throat lozenges.  Mild disease with risk for complications due to age and DM 2, he agrees with starting antiviral treatment: Paxlovid. We reviewed side effects as well as the risk for medication interaction. Clearly instructed about warning signs.  Cough - Plan: benzonatate (TESSALON) 100 MG capsule Explained that cough and congestion can last a few more days and even weeks after acute symptoms have resolved. I do not  think imaging is needed at this time. Symptomatic treatment with benzonatate recommended. Once cough becomes more productive, he can try plain Mucinex. Adequate hydration.  We discussed possible serious and likely etiologies, options for evaluation and workup, limitations of telemedicine visit vs in person visit, treatment, treatment risks and precautions.  I discussed the assessment and treatment plan with the patient. Mr. Blazejewski was provided an opportunity to ask questions and all were answered. He agreed with the plan and demonstrated an understanding of the instructions.  Return if symptoms worsen or fail to improve.  Arshiya Jakes [place], MD

## 2021-01-05 ENCOUNTER — Ambulatory Visit: Payer: Medicare Other | Admitting: Orthopedic Surgery

## 2021-01-16 ENCOUNTER — Other Ambulatory Visit: Payer: Self-pay

## 2021-01-16 ENCOUNTER — Ambulatory Visit: Payer: Medicare Other | Admitting: Orthopedic Surgery

## 2021-01-16 ENCOUNTER — Encounter: Payer: Self-pay | Admitting: Orthopedic Surgery

## 2021-01-16 DIAGNOSIS — R2241 Localized swelling, mass and lump, right lower limb: Secondary | ICD-10-CM | POA: Diagnosis not present

## 2021-01-22 ENCOUNTER — Encounter: Payer: Self-pay | Admitting: Orthopedic Surgery

## 2021-01-22 NOTE — Progress Notes (Signed)
Office Visit Note   Patient: Edward Mcgee           Date of Birth: 1956/02/08           MRN: [id number] Visit Date: 01/16/2021 Requested by: Anne Ng, NP [address] [place],  [place] [address] PCP: Anne Ng, NP  Subjective: Chief Complaint  Patient presents with   Right Knee - Pain    HPI: [name] is a 65 y.o. male who presents to the office complaining of right knee pain.  Following up following right knee injection with cyst aspiration on 11/11/2020.  Reports that this helped his pain significantly providing 80% relief.  He still has some pain but overall he is doing very well with no need to take any pain medication at all.  Pain is worse with heavy lifting but he denies any numbness/tingling, mechanical symptoms, knee instability..                ROS: All systems reviewed are negative as they relate to the chief complaint within the history of present illness.  Patient denies fevers or chills.  Assessment & Plan: Visit Diagnoses:  1. Mass of right knee     Plan: Patient is a 64 year old male who returns following right knee injection with cyst aspiration on 11/11/2020.  He had 80% relief.  No further symptoms that he feels he has to take any medication for.  Plan for patient to follow-up as needed if symptoms return in intensity.  He is not to the point where he is considering any sort of surgery at this time.  Follow-Up Instructions: No follow-ups on file.   Orders:  No orders of the defined types were placed in this encounter.  No orders of the defined types were placed in this encounter.     Procedures: No procedures performed   Clinical Data: No additional findings.  Objective: Vital Signs: There were no vitals taken for this visit.  Physical Exam:  Constitutional: Patient appears well-developed HEENT:  Head: Normocephalic Eyes:EOM are normal Neck: Normal range of motion Cardiovascular: Normal rate Pulmonary/chest:  Effort normal Neurologic: Patient is alert Skin: Skin is warm Psychiatric: Patient has normal mood and affect  Ortho Exam: Ortho exam demonstrates right knee with palpable cyst over the lateral aspect of the knee.  No significant tenderness.  No knee effusion noted.  No significant joint line tenderness.  No calf pain or tenderness.  Negative Homans' sign.  Able to perform straight leg raise without difficulty.  Range of motion from 0 to 120 degrees.  No pain with hip range of motion.  Specialty Comments:  No specialty comments available.  Imaging: No results found.   PMFS History: Patient Active Problem List   Diagnosis Date Noted   Atrophy of quadriceps femoris muscle 01/30/2019   GERD (gastroesophageal reflux disease) 12/24/2017   Groin rash 12/24/2017   Benign prostatic hyperplasia with incomplete bladder emptying 10/23/2017   Positive fecal occult blood test 10/23/2017   Vocal process granuloma 03/01/2017   Laryngopharyngeal reflux (LPR) 03/01/2017   Seborrheic dermatitis of scalp 08/16/2016   Muscle cramps 05/27/2015   Leg pain, lateral, right 05/27/2015   AV block, 1st degree 02/10/2013   DM (diabetes mellitus) (HCC) 02/10/2013   Hyperlipidemia 02/25/2007   BACK PAIN, LUMBAR, CHRONIC 05/31/2006   Past Medical History:  Diagnosis Date   DM (diabetes mellitus), type 2, uncontrolled ([facility]) 02/10/2013   GERD (gastroesophageal reflux disease)     Family History  Problem Relation Age of Onset   Breast cancer Sister    Breast cancer Sister    Uterine cancer Sister    Diabetes Other    Colon cancer Neg Hx    Esophageal cancer Neg Hx    Rectal cancer Neg Hx    Stomach cancer Neg Hx    Liver cancer Neg Hx     Past Surgical History:  Procedure Laterality Date   NO PAST SURGERIES     Social History   Occupational History   Occupation: Maintenance  Tobacco Use   Smoking status: Never   Smokeless tobacco: Never  Vaping Use   Vaping Use: Never used  Substance and  Sexual Activity   Alcohol use: Yes    Comment: socially   Drug use: No   Sexual activity: Not on file

## 2021-01-31 ENCOUNTER — Other Ambulatory Visit: Payer: Self-pay | Admitting: Nurse Practitioner

## 2021-01-31 DIAGNOSIS — E782 Mixed hyperlipidemia: Secondary | ICD-10-CM

## 2021-01-31 DIAGNOSIS — K2100 Gastro-esophageal reflux disease with esophagitis, without bleeding: Secondary | ICD-10-CM

## 2021-01-31 NOTE — Telephone Encounter (Signed)
I see one of the medications was just discontinued on 11/25/20 was this correct? Please advise

## 2021-03-02 ENCOUNTER — Other Ambulatory Visit: Payer: Self-pay | Admitting: Nurse Practitioner

## 2021-03-02 DIAGNOSIS — E782 Mixed hyperlipidemia: Secondary | ICD-10-CM

## 2021-03-14 ENCOUNTER — Other Ambulatory Visit: Payer: Self-pay | Admitting: Nurse Practitioner

## 2021-03-14 DIAGNOSIS — E1141 Type 2 diabetes mellitus with diabetic mononeuropathy: Secondary | ICD-10-CM

## 2021-03-15 ENCOUNTER — Telehealth: Payer: Self-pay | Admitting: Nurse Practitioner

## 2021-03-15 DIAGNOSIS — E1141 Type 2 diabetes mellitus with diabetic mononeuropathy: Secondary | ICD-10-CM

## 2021-03-17 MED ORDER — ONETOUCH ULTRA VI STRP: ORAL_STRIP | 3 refills | Status: DC

## 2021-03-17 NOTE — Telephone Encounter (Signed)
Rx sent in

## 2021-03-27 ENCOUNTER — Other Ambulatory Visit: Payer: Self-pay | Admitting: Nurse Practitioner

## 2021-03-27 DIAGNOSIS — E1141 Type 2 diabetes mellitus with diabetic mononeuropathy: Secondary | ICD-10-CM

## 2021-03-31 ENCOUNTER — Telehealth: Payer: Self-pay

## 2021-03-31 NOTE — Telephone Encounter (Signed)
Called and spoke to [facility] rep regarding Prior Auth for Patient. PA started today 03/31/2021 for Onetouch Ultra Strips. TAT 72 hrs. Ref # [id number]

## 2021-05-02 ENCOUNTER — Other Ambulatory Visit: Payer: Self-pay | Admitting: Nurse Practitioner

## 2021-05-02 DIAGNOSIS — K2100 Gastro-esophageal reflux disease with esophagitis, without bleeding: Secondary | ICD-10-CM

## 2021-05-10 NOTE — Telephone Encounter (Signed)
Chart supports Rx Last seen 11/2020 Next OV 05/2021 

## 2021-05-25 ENCOUNTER — Ambulatory Visit: Payer: Medicare Other | Admitting: Nurse Practitioner

## 2021-06-14 ENCOUNTER — Ambulatory Visit: Payer: Self-pay

## 2021-06-14 ENCOUNTER — Other Ambulatory Visit: Payer: Self-pay

## 2021-06-14 ENCOUNTER — Ambulatory Visit (INDEPENDENT_AMBULATORY_CARE_PROVIDER_SITE_OTHER): Payer: Medicare HMO | Admitting: Orthopedic Surgery

## 2021-06-14 DIAGNOSIS — R2241 Localized swelling, mass and lump, right lower limb: Secondary | ICD-10-CM | POA: Diagnosis not present

## 2021-06-15 ENCOUNTER — Encounter: Payer: Self-pay | Admitting: Orthopedic Surgery

## 2021-06-15 MED ORDER — BUPIVACAINE HCL 0.25 % IJ SOLN
4.0000 mL | INTRAMUSCULAR | Status: AC | PRN
  Administered 2021-06-14: 4 mL via INTRA_ARTICULAR

## 2021-06-15 MED ORDER — LIDOCAINE HCL 1 % IJ SOLN
5.0000 mL | INTRAMUSCULAR | Status: AC | PRN
  Administered 2021-06-14: 5 mL

## 2021-06-15 MED ORDER — METHYLPREDNISOLONE ACETATE 40 MG/ML IJ SUSP
10.0000 mg | INTRAMUSCULAR | Status: AC | PRN
  Administered 2021-06-14: 10 mg via INTRAMUSCULAR

## 2021-06-15 NOTE — Progress Notes (Signed)
Office Visit Note   Patient: Edward Mcgee           Date of Birth: 07-14-1955           MRN: [id number] Visit Date: 06/14/2021 Requested by: Anne Ng, NP [address] [place],  [place] [address] PCP: Anne Ng, NP  Subjective: Chief Complaint  Patient presents with   Right Knee - Pain    HPI: Patient presents for evaluation of right knee pain.  Has known history of right knee cyst which is a ganglion cyst on the lateral aspect of the knee.  Reports constant pain at night.  Denies any weakness locking or giving way.  Does do a lot of exercise.  The pain will wake him from sleep at night.  Insidious onset about 1 month ago.  He has had this cyst aspirated over 6 months ago with good result until now.  No interval injury.              ROS: All systems reviewed are negative as they relate to the chief complaint within the history of present illness.  Patient denies  fevers or chills.   Assessment & Plan: Visit Diagnoses:  1. Mass of right knee     Plan: Impression is recurrent pain from ganglion cyst.  Plan is repeat aspiration of the cyst with puncturing of the cyst as well as injection of cortisone.  Follow-up as needed.  We did get about 4 cc of gelatinous aspirate out under ultrasound guidance.  Follow-Up Instructions: Return if symptoms worsen or fail to improve.   Orders:  Orders Placed This Encounter  Procedures   US Guided Needle Placement - No Linked Charges   No orders of the defined types were placed in this encounter.     Procedures: Large Joint Inj: R knee on 06/14/2021 7:45 AM Indications: diagnostic evaluation, joint swelling and pain Details: 18 G 1.5 in needle, ultrasound-guided superolateral approach  Arthrogram: No  Medications: 5 mL lidocaine 1 %; 4 mL bupivacaine 0.25 %; 10 mg methylPREDNISolone acetate 40 MG/ML Outcome: tolerated well, no immediate complications Procedure, treatment alternatives, risks and benefits  explained, specific risks discussed. Consent was given by the patient. Immediately prior to procedure a time out was called to verify the correct patient, procedure, equipment, support staff and site/side marked as required. Patient was prepped and draped in the usual sterile fashion.      Clinical Data: No additional findings.  Objective: Vital Signs: There were no vitals taken for this visit.  Physical Exam:   Constitutional: Patient appears well-developed HEENT:  Head: Normocephalic Eyes:EOM are normal Neck: Normal range of motion Cardiovascular: Normal rate Pulmonary/chest: Effort normal Neurologic: Patient is alert Skin: Skin is warm Psychiatric: Patient has normal mood and affect   Ortho Exam: Ortho exam demonstrates full active and passive range of motion of that right knee.  No joint effusion.  There is palpable cyst which is dumbbell shaped on either side of the iliotibial band just proximal to the joint line.  Ankle dorsiflexion plantarflexion intact.  Pedal pulses palpable.  This mass is cystic in nature confirmed under ultrasound examination.  Specialty Comments:  No specialty comments available.  Imaging: US Guided Needle Placement - No Linked Charges  Result Date: 06/15/2021 Ultrasound examination of the right knee demonstrates needle decompression of ganglion cyst with visualization of injected steroid into the cyst cavity with no complicating features    PMFS History: Patient Active Problem List   Diagnosis  Date Noted   Atrophy of quadriceps femoris muscle 01/30/2019   GERD (gastroesophageal reflux disease) 12/24/2017   Groin rash 12/24/2017   Benign prostatic hyperplasia with incomplete bladder emptying 10/23/2017   Positive fecal occult blood test 10/23/2017   Vocal process granuloma 03/01/2017   Laryngopharyngeal reflux (LPR) 03/01/2017   Seborrheic dermatitis of scalp 08/16/2016   Muscle cramps 05/27/2015   Leg pain, lateral, right 05/27/2015   AV  block, 1st degree 02/10/2013   DM (diabetes mellitus) (HCC) 02/10/2013   Hyperlipidemia 02/25/2007   BACK PAIN, LUMBAR, CHRONIC 05/31/2006   Past Medical History:  Diagnosis Date   DM (diabetes mellitus), type 2, uncontrolled (HCC) 02/10/2013   GERD (gastroesophageal reflux disease)     Family History  Problem Relation Age of Onset   Breast cancer Sister    Breast cancer Sister    Uterine cancer Sister    Diabetes Other    Colon cancer Neg Hx    Esophageal cancer Neg Hx    Rectal cancer Neg Hx    Stomach cancer Neg Hx    Liver cancer Neg Hx     Past Surgical History:  Procedure Laterality Date   NO PAST SURGERIES     Social History   Occupational History   Occupation: Maintenance  Tobacco Use   Smoking status: Never   Smokeless tobacco: Never  Vaping Use   Vaping Use: Never used  Substance and Sexual Activity   Alcohol use: Yes    Comment: socially   Drug use: No   Sexual activity: Not on file

## 2021-06-19 ENCOUNTER — Encounter: Payer: Self-pay | Admitting: Nurse Practitioner

## 2021-06-19 ENCOUNTER — Other Ambulatory Visit: Payer: Self-pay

## 2021-06-19 ENCOUNTER — Ambulatory Visit (INDEPENDENT_AMBULATORY_CARE_PROVIDER_SITE_OTHER): Payer: Medicare HMO | Admitting: Nurse Practitioner

## 2021-06-19 VITALS — BP 142/78 | HR 78 | Ht 64.0 in | Wt 163.0 lb

## 2021-06-19 DIAGNOSIS — N401 Enlarged prostate with lower urinary tract symptoms: Secondary | ICD-10-CM

## 2021-06-19 DIAGNOSIS — I10 Essential (primary) hypertension: Secondary | ICD-10-CM | POA: Diagnosis not present

## 2021-06-19 DIAGNOSIS — E1141 Type 2 diabetes mellitus with diabetic mononeuropathy: Secondary | ICD-10-CM | POA: Diagnosis not present

## 2021-06-19 DIAGNOSIS — E782 Mixed hyperlipidemia: Secondary | ICD-10-CM | POA: Diagnosis not present

## 2021-06-19 DIAGNOSIS — Z139 Encounter for screening, unspecified: Secondary | ICD-10-CM

## 2021-06-19 DIAGNOSIS — K219 Gastro-esophageal reflux disease without esophagitis: Secondary | ICD-10-CM

## 2021-06-19 DIAGNOSIS — R3914 Feeling of incomplete bladder emptying: Secondary | ICD-10-CM

## 2021-06-19 HISTORY — DX: Essential (primary) hypertension: I10

## 2021-06-19 LAB — POCT GLYCOSYLATED HEMOGLOBIN (HGB A1C): Hemoglobin A1C: 8.5 % — AB (ref 4.0–5.6)

## 2021-06-19 MED ORDER — EMPAGLIFLOZIN 10 MG PO TABS: 10.0000 mg | ORAL_TABLET | Freq: Every day | ORAL | 3 refills | Status: DC

## 2021-06-19 MED ORDER — LISINOPRIL 5 MG PO TABS: 5.0000 mg | ORAL_TABLET | Freq: Every day | ORAL | 3 refills | Status: DC

## 2021-06-19 NOTE — Progress Notes (Signed)
° °  Edward Mcgee     MRN: [id number]      DOB: [birth year]   HPI Mr. Klonowski is here to establish care. Interpretation services provided by Rosalyn Charters (staff of for [employer] access partner). Previous PCP was Alysia Penna, NP at [facility] primary care  Pt stated that he is up to date with his covid vaccine, flu vaccine,and shingles vaccine, he will bring his vaccine card during his next visit. Pt stated that he has had his yearly exam eye exam, we will get results from his opthamologist.   Pt stated that his Cbg 170's in the morning, 200's at bedtime, h was previously taking Metformin but it was stopped due to making  him vomit.      ROS Denies recent fever or chills. Denies sinus pressure, nasal congestion, ear pain or sore throat. Denies chest congestion, productive cough or wheezing. Denies chest pains, palpitations and leg swelling Denies abdominal pain, nausea, vomiting,diarrhea or constipation.   Denies dysuria, frequency, hesitancy or incontinence. Denies joint pain, swelling and limitation in mobility. Denies headaches, seizures, numbness, or tingling. Denies depression, anxiety or insomnia. Denies skin break down or rash.   PE  BP (!) 146/84 (BP Location: Right Arm, Patient Position: Sitting, Cuff Size: Normal)    Pulse 78    Ht 5\' 4"  (1.626 m)    Wt 163 lb (73.9 kg)    SpO2 94%    BMI 27.98 kg/m   Patient alert and oriented and in no cardiopulmonary distress.  HEENT: No facial asymmetry, EOMI,     Neck supple .  Chest: Clear to auscultation bilaterally.  CVS: S1, S2 no murmurs, no S3.Regular rate.  ABD: Soft non tender.   Ext: No edema  MS: Adequate ROM spine, shoulders, hips and knees.  Skin: Intact, no ulcerations or rash noted.  Psych: Good eye contact, normal affect. Memory intact not anxious or depressed appearing.  CNS: CN 2-12 intact, power,  normal throughout.no focal deficits noted.   Assessment & Plan

## 2021-06-19 NOTE — Assessment & Plan Note (Addendum)
DASH diet and commitment to daily physical activity for a minimum of 30 minutes discussed and encouraged, as a part of hypertension management. The importance of attaining a healthy weight is also discussed.  BP/Weight 06/19/2021 01/02/2021 11/23/2020 04/26/2020 01/28/2020 01/25/2020 [id number]  Systolic BP A999333 0000000 [id number] A999333 - 0000000 123456  Diastolic BP 78 75 64 76 - 80 82  Wt. (Lbs) 163 - 158.4 160.4 158 158 160  BMI 27.98 27.19 27.19 27.53 27.12 27.12 27.46   Start lisinopril 5mg  tablet daily. Monitor BP at home, goal is for systolic b to be less than 130.  EKG show SR with first degree AV block rate 61.

## 2021-06-19 NOTE — Patient Instructions (Addendum)
Please get your labs done 3-5 days before your next visit. Please take lisinopril 5mg  for your blood pressure. Take jardiance 10mg  daily for your blood sugar.   Please continue to check your blood sugar 2 times daily.   Goal for fasting blood sugar ranges from 80 to 120 and 2 hours after any meal or at bedtime should be between 130 to 170.    It is important that you exercise regularly at least 30 minutes 5 times a week.  Think about what you will eat, plan ahead. Choose " clean, green, fresh or frozen" over canned, processed or packaged foods which are more sugary, salty and fatty. 70 to 75% of food eaten should be vegetables and fruit. Three meals at set times with snacks allowed between meals, but they must be fruit or vegetables. Aim to eat over a 12 hour period , example 7 am to 7 pm, and STOP after  your last meal of the day. Drink water,generally about 64 ounces per day, no other drink is as healthy. Fruit juice is best enjoyed in a healthy way, by EATING the fruit.  Thanks for choosing [facility], we consider it a privelige to serve you.

## 2021-06-19 NOTE — Assessment & Plan Note (Signed)
Stable continue omeprazole 20mg daily.

## 2021-06-19 NOTE — Assessment & Plan Note (Addendum)
Lab Results  Component Value Date   HGBA1C 8.5 (A) 06/19/2021  continue glipizide 5mg  XL daily, Star jardiance 10mg  daily, avoid sugar, sweet, soda, engage in reguar exercise start lisinopril for HTN

## 2021-06-19 NOTE — Assessment & Plan Note (Signed)
Continue flomax 0.4mg daily.  ?

## 2021-06-19 NOTE — Assessment & Plan Note (Signed)
Takes atorvastatin 40mg  daily. Lipid panel ordered to be done before next visit.

## 2021-06-20 ENCOUNTER — Encounter: Payer: Self-pay | Admitting: *Deleted

## 2021-06-21 ENCOUNTER — Other Ambulatory Visit: Payer: Self-pay | Admitting: Nurse Practitioner

## 2021-06-21 ENCOUNTER — Telehealth: Payer: Self-pay

## 2021-06-21 DIAGNOSIS — E1141 Type 2 diabetes mellitus with diabetic mononeuropathy: Secondary | ICD-10-CM

## 2021-06-21 NOTE — Telephone Encounter (Signed)
Thayer Ohm the pharmacist will be reaching out to pt about med cost assistance. Thanks

## 2021-06-21 NOTE — Telephone Encounter (Signed)
Patient spouse called saying the medicine Jardiance 10 mg too expensive. Can something else be called into pharmacy that is cheaper.   [facility]

## 2021-06-22 ENCOUNTER — Telehealth: Payer: Self-pay | Admitting: *Deleted

## 2021-06-22 NOTE — Chronic Care Management (AMB) (Signed)
°  Chronic Care Management   Outreach Note  06/22/2021 Name: Jaquarious Grey MRN: [id number] DOB: [birth year]  Delsin Copen is a 65 y.o. year old male who is a primary care patient of Donell Beers, FNP. I reached out to [employer] by phone today using [employer] interpreter services [phone] named [place] in response to a referral sent by Mr. Hendrixx Severin primary care provider.  An unsuccessful telephone outreach was attempted today. The patient was referred to the case management team for assistance with care management and care coordination.   Follow Up Plan: The care management team will reach out to the patient again over the next 7 days. If patient returns call to provider office, please advise to call Embedded Care Management Care Guide Misty Stanley at [phone].  Gwenevere Ghazi  Care Guide, Embedded Care Coordination [facility] Management  Direct Dial: [phone]

## 2021-06-22 NOTE — Telephone Encounter (Signed)
Called and notified patients spouse of providers recommendations, she verbalized understanding.

## 2021-06-22 NOTE — Telephone Encounter (Signed)
Patient spouse called still waiting on a call, need meds soon. Call back # [phone]

## 2021-06-26 NOTE — Chronic Care Management (AMB) (Signed)
°  Chronic Care Management   Outreach Note  06/26/2021 Name: Edward Mcgee MRN: [id number] DOB: 1956-02-04  Edward Mcgee is a 66 y.o. year old male who is a primary care patient of [name], FNP. I reached out to [employer] by phone today using [employer] [phone] named Edward Mcgee response to a referral sent by Edward Mcgee primary care provider.  A second unsuccessful telephone outreach was attempted today. The patient was referred to the case management team for assistance with care management and care coordination.   Follow Up Plan: The care management team will reach out to the patient again over the next 5 days.  If patient returns call to provider office, please advise to call [facility]* at [phone].*  [place] Management  Direct Dial: [phone]

## 2021-06-28 NOTE — Chronic Care Management (AMB) (Signed)
°  Chronic Care Management   Outreach Note  06/28/2021 Name: Edward Mcgee MRN: [id number] DOB: 05/15/1956  Edward Mcgee is a 66 y.o. year old male who is a primary care patient of Donell Beers, FNP. I reached out to [facility] by phone today in response to a referral sent by Edward Mcgee primary care provider.  Third unsuccessful telephone outreach was attempted today. The patient was referred to the case management team for assistance with care management and care coordination. The patient's primary care provider has been notified of our unsuccessful attempts to make or maintain contact with the patient. The care management team is pleased to engage with this patient at any time in the future should he/she be interested in assistance from the care management team.   Follow Up Plan: We have been unable to make contact with the patient.The care management team is available to follow up with the patient after provider conversation with the patient regarding recommendation for care management engagement and subsequent re-referral to the care management team. A HIPAA compliant phone message was left for the patient providing contact information and requesting a return call. If patient returns call to provider office, please advise to call Embedded Care Management Care Guide Misty Stanley at [phone].  Gwenevere Ghazi  Care Guide, Embedded Care Coordination [facility] Management  Direct Dial: [phone]

## 2021-07-03 ENCOUNTER — Telehealth: Payer: Self-pay | Admitting: *Deleted

## 2021-07-03 NOTE — Chronic Care Management (AMB) (Signed)
Chronic Care Management   Note  07/03/2021 Name: Edward Mcgee MRN: [id number] DOB: [birth year]  [name] is a 66 y.o. year old male who is a primary care patient of [name], FNP. I reached out to [facility] by phone today in response to a referral sent by [name] PCP.  [name] was given information about Chronic Care Management services today including:  CCM service includes personalized support from designated clinical staff supervised by his physician, including individualized plan of care and coordination with other care providers 24/7 contact phone numbers for assistance for urgent and routine care needs. Service will only be billed when office clinical staff spend 20 minutes or more in a month to coordinate care. Only one practitioner may furnish and bill the service in a calendar month. The patient may stop CCM services at any time (effective at the end of the month) by phone call to the office staff. The patient is responsible for co-pay (up to 20% after annual deductible is met) if co-pay is required by the individual health plan.   Patient agreed to services and verbal consent obtained.   Follow up plan: Telephone appointment with care management team member scheduled for:07/06/21  [place] Management  Direct Dial: [phone]

## 2021-07-06 ENCOUNTER — Other Ambulatory Visit: Payer: Self-pay

## 2021-07-06 ENCOUNTER — Ambulatory Visit (INDEPENDENT_AMBULATORY_CARE_PROVIDER_SITE_OTHER): Payer: Medicare HMO | Admitting: Pharmacist

## 2021-07-06 VITALS — BP 127/77 | HR 88

## 2021-07-06 DIAGNOSIS — E782 Mixed hyperlipidemia: Secondary | ICD-10-CM

## 2021-07-06 DIAGNOSIS — E1141 Type 2 diabetes mellitus with diabetic mononeuropathy: Secondary | ICD-10-CM

## 2021-07-06 DIAGNOSIS — I10 Essential (primary) hypertension: Secondary | ICD-10-CM

## 2021-07-06 NOTE — Chronic Care Management (AMB) (Signed)
Chronic Care Management Pharmacy Note  07/06/2021 Name:  Edward Mcgee MRN:  [id number] DOB:  10-Jun-1956  Summary: Type 2 Diabetes Uncontrolled; Most recent A1c increased to 8.5% which is above goal of <7% per ADA guidelines Current medications: empagliflozin (Jardiance) 10 mg by mouth once daily and glipizide XL 5 mg by mouth every morning Intolerances:  metformin (vomiting) Patient has not yet started Jardiance due to cost concerns Based on patient's current household size and income, he qualifies for patient assistance. Will complete patient assistance applications for Jardiance through [facility]. Application completed, signed, and faxed in office today. Patient reports he can pay $45 copay at pharmacy for 1 month so he can start therapy now.   Hypertension Blood pressure under good control. Blood pressure is at goal of <130/80 mmHg per 2017 AHA/ACC guidelines. Continue lisinopril 5 mg by mouth once daily  Hyperlipidemia Uncontrolled. LDL above goal of <70 due to very high risk given diabetes + at least 1 additional major risk factor (hypertension) per 2020 AACE/ACE guidelines. Triglycerides above goal of <150 per 2020 AACE/ACE guidelines. Current medications: atorvastatin 40 mg by mouth once daily (increased August '22) If LDL remains elevated above goal at next check, will consider addition of ezetimibe 10 mg by mouth daily May also consider Vascepa for triglycerides (see REDUCE-IT study). Will discuss more at future visits.   Subjective: Edward Mcgee is an 66 y.o. year old male who is a primary patient of [name], FNP.  The CCM team was consulted for assistance with disease management and care coordination needs.    Engaged with patient face to face for initial visit in response to provider referral for pharmacy case management and/or care coordination services. Patient presented with his wife Alyson Locket) and translator Lonna Duval).   Consent to Services:  The patient  was given the following information about Chronic Care Management services today, agreed to services, and gave verbal consent: 1. CCM service includes personalized support from designated clinical staff supervised by the primary care provider, including individualized plan of care and coordination with other care providers 2. 24/7 contact phone numbers for assistance for urgent and routine care needs. 3. Service will only be billed when office clinical staff spend 20 minutes or more in a month to coordinate care. 4. Only one practitioner may furnish and bill the service in a calendar month. 5.The patient may stop CCM services at any time (effective at the end of the month) by phone call to the office staff. 6. The patient will be responsible for cost sharing (co-pay) of up to 20% of the service fee (after annual deductible is met). Patient agreed to services and consent obtained.  Patient Care Team: [name], FNP as PCP - General (Nurse Practitioner) Beryle Lathe, [facility] (Pharmacist)  Objective:  Lab Results  Component Value Date   CREATININE 0.88 11/23/2020   CREATININE 0.90 01/25/2020   CREATININE 0.84 04/24/2019    Lab Results  Component Value Date   HGBA1C 8.5 (A) 06/19/2021   Last diabetic Eye exam:  Lab Results  Component Value Date/Time   HMDIABEYEEXA No Retinopathy 05/25/2020 12:00 AM   HMDIABEYEEXA No Retinopathy 05/25/2020 12:00 AM    Last diabetic Foot exam: No results found for: HMDIABFOOTEX      Component Value Date/Time   CHOL 183 11/23/2020 1443   TRIG 242.0 (H) 11/23/2020 1443   HDL 56.60 11/23/2020 1443   CHOLHDL 3 11/23/2020 1443   VLDL 48.4 (H) 11/23/2020 1443  LDLCALC 73 01/25/2020 0840   LDLDIRECT 97.0 11/23/2020 1443    Hepatic Function Latest Ref Rng & Units 11/23/2020 01/25/2020 04/24/2019  Total Protein 6.0 - 8.3 g/dL 7.2 6.6 6.6  Albumin 3.5 - 5.2 g/dL 4.6 4.3 4.3  AST 0 - 37 U/L '16 19 18  ' ALT 0 - 53 U/L '24 27 21  ' Alk Phosphatase  39 - 117 U/L 80 71 63  Total Bilirubin 0.2 - 1.2 mg/dL 0.3 0.5 0.4  Bilirubin, Direct 0.0 - 0.3 mg/dL - 0.1 -    Lab Results  Component Value Date/Time   TSH 1.75 07/16/2017 09:04 AM    CBC Latest Ref Rng & Units 11/23/2020 04/24/2019 07/16/2017  WBC 4.0 - 10.5 K/uL 7.5 7.6 5.1  Hemoglobin 13.0 - 17.0 g/dL 14.3 13.2 13.9  Hematocrit 39.0 - 52.0 % 42.9 40.6 41.7  Platelets 150.0 - 400.0 K/uL 308.0 262.0 277.0    No results found for: VD25OH  Clinical ASCVD: No  The 10-year ASCVD risk score (Arnett DK, et al., 2019) is: 25%   Values used to calculate the score:     Age: 66 years     Sex: Male     Is Non-Hispanic African American: No     Diabetic: Yes     Tobacco smoker: No     Systolic Blood Pressure: 161 mmHg     Is BP treated: Yes     HDL Cholesterol: 56.6 mg/dL     Total Cholesterol: 183 mg/dL    Social History   Tobacco Use  Smoking Status Never  Smokeless Tobacco Never   BP Readings from Last 3 Encounters:  07/06/21 127/77  06/19/21 (!) 142/78  01/02/21 138/75   Pulse Readings from Last 3 Encounters:  07/06/21 88  06/19/21 78  01/02/21 73   Wt Readings from Last 3 Encounters:  06/19/21 163 lb (73.9 kg)  11/23/20 158 lb 6.4 oz (71.8 kg)  04/26/20 160 lb 6.4 oz (72.8 kg)    Assessment: Review of patient past medical history, allergies, medications, health status, including review of consultants reports, laboratory and other test data, was performed as part of comprehensive evaluation and provision of chronic care management services.   SDOH:  (Social Determinants of Health) assessments and interventions performed:    CCM Care Plan  Allergies  Allergen Reactions   Other Shortness Of Breath    Raspberry juice. Pt report SOB,itchy and swelling.    Peanut-Containing Drug Products Other (See Comments)    Itching, can breath and hives    Medications Reviewed Today     Reviewed by Beryle Lathe, [facility] (Pharmacist) on 07/06/21 at 1043  Med List  Status: <None>   Medication Order Taking? Sig Documenting Provider Last Dose Status Informant  0.9 %  sodium chloride infusion [id number]   Milus Banister, MD  Active   acetaminophen (TYLENOL) 325 MG tablet [id number] Yes Take 650 mg by mouth every 6 (six) hours as needed. [provider] Taking Active   atorvastatin (LIPITOR) 40 MG tablet [id number] Yes Take 1 tablet (40 mg total) by mouth at bedtime. Flossie Buffy, NP Taking Active   blood glucose meter kit and supplies KIT [id number]  Dispense based on patient and insurance preference. Use before breakfast and at bedtime. ICD E11.9 [name], [name], NP  Active   Cholecalciferol (VITAMIN D3 PO) [id number] Yes Take by mouth. [provider] Taking Active   empagliflozin (JARDIANCE) 10 MG TABS tablet [id number] No Take 1 tablet (10 mg  total) by mouth daily before breakfast.  Patient not taking: Reported on 07/06/2021   [name], FNP Not Taking Active   glipiZIDE (GLUCOTROL XL) 5 MG 24 hr tablet [id number] Yes Take 1 tablet (5 mg total) by mouth daily with breakfast. [name], [name], NP Taking Active   glucose blood (ONETOUCH ULTRA) test strip [id number]  USE 2 test strips,  TO CHECK GLUCOSE BID [name], [name], NP  Active   lisinopril (ZESTRIL) 5 MG tablet [id number] Yes Take 1 tablet (5 mg total) by mouth daily. [name], FNP Taking Active   Magnesium 200 MG TABS [id number] Yes Take 1 tablet by mouth daily. [provider] Taking Active Self  omeprazole (PRILOSEC) 20 MG capsule [id number] Yes Take 1 capsule by mouth once daily [name], [name], NP Taking Active   [facility] LANCETS [place] [id number]   [provider]  Active   tamsulosin (FLOMAX) 0.4 MG CAPS capsule [id number] Yes Take 1 capsule (0.4 mg total) by mouth daily. Flossie Buffy, NP Taking Active   vitamin E 1000 UNIT capsule [id number] Yes Take 1,000 Units by mouth daily. [provider] Taking  Active   Zinc Sulfate (ZINC 15 PO) [id number] Yes Take by mouth. [provider] Taking Active             Patient Active Problem List   Diagnosis Date Noted   Hypertension 06/19/2021   Atrophy of quadriceps femoris muscle 01/30/2019   GERD (gastroesophageal reflux disease) 12/24/2017   Groin rash 12/24/2017   Benign prostatic hyperplasia with incomplete bladder emptying 10/23/2017   Positive fecal occult blood test 10/23/2017   Vocal process granuloma 03/01/2017   Laryngopharyngeal reflux (LPR) 03/01/2017   Seborrheic dermatitis of scalp 08/16/2016   Muscle cramps 05/27/2015   Leg pain, lateral, right 05/27/2015   AV block, 1st degree 02/10/2013   DM (diabetes mellitus) ([place]) 02/10/2013   Hyperlipidemia 02/25/2007   BACK PAIN, LUMBAR, CHRONIC 05/31/2006    Immunization History  Administered Date(s) Administered   Fluad Quad(high Dose 65+) 04/24/2019   Influenza Whole 06/11/2006, 04/18/2007   Influenza, High Dose Seasonal PF 07/16/2017, 06/25/2018   Influenza,inj,Quad PF,6+ Mos 05/10/2016   PFIZER(Purple Top)SARS-COV-2 Vaccination 08/20/2019, 09/10/2019, 04/28/2020, 01/11/2021   Pneumococcal Conjugate-13 12/23/2017   Pneumococcal Polysaccharide-23 04/18/2007, 01/25/2020   Td 05/11/2006   Zoster, Live 12/05/2015    Conditions to be addressed/monitored: HTN, HLD, and DMII  Care Plan : Medication Management  Updates made by Beryle Lathe, [place] since 07/06/2021 12:00 AM     Problem: T2DM, HTN, HLD   Priority: High  Onset Date: 07/06/2021     Long-Range Goal: Disease Progression Prevention   Start Date: 07/06/2021  Expected End Date: 10/04/2021  This Visit's Progress: On track  Priority: High  Note:   Current Barriers:  Unable to independently afford treatment regimen Unable to achieve control of diabetes and hyperlipidemia  Pharmacist Clinical Goal(s):  Through collaboration with PharmD and provider, patient will  Verbalize ability to afford  treatment regimen Achieve control of diabetes and hyperlipidemia as evidenced by improved fasting blood sugar, improved A1c, improved LDL, and improved triglycerides   Interventions: 1:1 collaboration with [name], FNP regarding development and update of comprehensive plan of care as evidenced by provider attestation and co-signature Inter-disciplinary care team collaboration (see longitudinal plan of care) Comprehensive medication review performed; medication list updated in electronic medical record  Type 2 Diabetes - New goal.: Uncontrolled; Most recent A1c increased to 8.5% which  is above goal of <7% per ADA guidelines Current medications: empagliflozin (Jardiance) 10 mg by mouth once daily and glipizide XL 5 mg by mouth every morning Intolerances:  metformin (vomiting) Taking medications as directed: no, patient has not yet started Jardiance due to cost concerns Side effects thought to be attributed to current medication regimen: no Hypoglycemia prevention: not indicated at this time Current meal patterns: not discussed today Current exercise: not discussed today On a statin: yes On aspirin 81 mg daily: no Last microalbumin/creatinine ratio: 0.6 (04/26/20); on an ACEi/ARB: yes Last eye exam: overdue Last foot exam: overdue Current glucose readings:  not discussed today Continue current medications as above per primary care provider Instructed to monitor blood sugars twice a day at the following times: fasting (at least 8 hours since last food consumption) and bedtime  Based on patient's current household size and income, he qualifies for patient assistance. Will complete patient assistance applications for Jardiance through [facility]. Application completed, signed, and faxed in office today. Patient reports he can pay $45 copay at pharmacy for 1 month so he can start therapy now.   Hypertension - New goal.: Blood pressure under good control. Blood pressure is at goal of  <130/80 mmHg per 2017 AHA/ACC guidelines. Current medications: lisinopril 5 mg by mouth once daily (started 06/19/21) Intolerances: none Taking medications as directed: yes Side effects thought to be attributed to current medication regimen: no Current home blood pressure: not discussed today Continue lisinopril 5 mg by mouth once daily Encourage dietary sodium restriction/DASH diet Recommend home blood pressure monitoring to discuss at next visit  Hyperlipidemia - New goal.: Uncontrolled. LDL above goal of <70 due to very high risk given diabetes + at least 1 additional major risk factor (hypertension) per 2020 AACE/ACE guidelines. Triglycerides above goal of <150 per 2020 AACE/ACE guidelines. Current medications: atorvastatin 40 mg by mouth once daily (increased August '22) Intolerances: none Taking medications as directed: yes Side effects thought to be attributed to current medication regimen: no Continue atorvastatin 40 mg by mouth once daily Encourage dietary reduction of high fat containing foods such as butter, nuts, bacon, egg yolks, etc. If LDL remains elevated above goal at next check, will consider addition of ezetimibe 10 mg by mouth daily May also consider Vascepa for triglycerides (see REDUCE-IT study). Will discuss more at future visits.   Patient Goals/Self-Care Activities Patient will:  Take medications as prescribed Check blood sugar twice a day at the following times: fasting (at least 8 hours since last food consumption) and bedtime, document, and provide at future appointments Check blood pressure at least once daily, document, and provide at future appointments Collaborate with provider on medication access solutions Target a minimum of 150 minutes of moderate intensity exercise weekly Engage in dietary modifications by fewer sweetened foods & beverages  Follow Up Plan: Telephone follow up appointment with care management team member scheduled for: 08/08/21       Medication Assistance: Application for Jardiance  medication assistance program. in process.  Anticipated assistance start date 08/08/21.  See plan of care for additional detail.  Patient's preferred pharmacy is:  [facility] [address], [place] - [place] [address] [place] [address] Phone: [phone] Fax: [phone]  Follow Up:  Patient agrees to Care Plan and Follow-up.  Plan: Telephone follow up appointment with care management team member scheduled for:  08/08/21  Kennon Holter, PharmD, [place], [place] Clinical Pharmacist Practitioner [facility] [phone]

## 2021-07-06 NOTE — Patient Instructions (Signed)
Edward Mcgee,  It was great to talk to you today!  Please call me with any questions or concerns.  Visit Information   Following is a copy of your full care plan:  Care Plan : Medication Management  Updates made by Beryle Lathe, [place] since 07/06/2021 12:00 AM     Problem: T2DM, HTN, HLD   Priority: High  Onset Date: 07/06/2021     Long-Range Goal: Disease Progression Prevention   Start Date: 07/06/2021  Expected End Date: 10/04/2021  This Visit's Progress: On track  Priority: High  Note:   Current Barriers:  Unable to independently afford treatment regimen Unable to achieve control of diabetes and hyperlipidemia  Pharmacist Clinical Goal(s):  Through collaboration with PharmD and provider, patient will  Verbalize ability to afford treatment regimen Achieve control of diabetes and hyperlipidemia as evidenced by improved fasting blood sugar, improved A1c, improved LDL, and improved triglycerides   Interventions: 1:1 collaboration with [name], FNP regarding development and update of comprehensive plan of care as evidenced by provider attestation and co-signature Inter-disciplinary care team collaboration (see longitudinal plan of care) Comprehensive medication review performed; medication list updated in electronic medical record  Type 2 Diabetes - New goal.: Uncontrolled; Most recent A1c increased to 8.5% which is above goal of <7% per ADA guidelines Current medications: empagliflozin (Jardiance) 10 mg by mouth once daily and glipizide XL 5 mg by mouth every morning Intolerances:  metformin (vomiting) Taking medications as directed: no, patient has not yet started Jardiance due to cost concerns Side effects thought to be attributed to current medication regimen: no Hypoglycemia prevention: not indicated at this time Current meal patterns: not discussed today Current exercise: not discussed today On a statin: yes On aspirin 81 mg daily: no Last  microalbumin/creatinine ratio: 0.6 (04/26/20); on an ACEi/ARB: yes Last eye exam: overdue Last foot exam: overdue Current glucose readings:  not discussed today Continue current medications as above per primary care provider Instructed to monitor blood sugars twice a day at the following times: fasting (at least 8 hours since last food consumption) and bedtime  Based on patient's current household size and income, he qualifies for patient assistance. Will complete patient assistance applications for Jardiance through [facility]. Application completed, signed, and faxed in office today. Patient reports he can pay $45 copay at pharmacy for 1 month so he can start therapy now.   Hypertension - New goal.: Blood pressure under good control. Blood pressure is at goal of <130/80 mmHg per 2017 AHA/ACC guidelines. Current medications: lisinopril 5 mg by mouth once daily (started 06/19/21) Intolerances: none Taking medications as directed: yes Side effects thought to be attributed to current medication regimen: no Current home blood pressure: not discussed today Continue lisinopril 5 mg by mouth once daily Encourage dietary sodium restriction/DASH diet Recommend home blood pressure monitoring to discuss at next visit  Hyperlipidemia - New goal.: Uncontrolled. LDL above goal of <70 due to very high risk given diabetes + at least 1 additional major risk factor (hypertension) per 2020 AACE/ACE guidelines. Triglycerides above goal of <150 per 2020 AACE/ACE guidelines. Current medications: atorvastatin 40 mg by mouth once daily (increased August '22) Intolerances: none Taking medications as directed: yes Side effects thought to be attributed to current medication regimen: no Continue atorvastatin 40 mg by mouth once daily Encourage dietary reduction of high fat containing foods such as butter, nuts, bacon, egg yolks, etc. If LDL remains elevated above goal at next check, will consider addition of ezetimibe  10 mg by mouth daily May also consider Vascepa for triglycerides (see REDUCE-IT study). Will discuss more at future visits.   Patient Goals/Self-Care Activities Patient will:  Take medications as prescribed Check blood sugar twice a day at the following times: fasting (at least 8 hours since last food consumption) and bedtime, document, and provide at future appointments Check blood pressure at least once daily, document, and provide at future appointments Collaborate with provider on medication access solutions Target a minimum of 150 minutes of moderate intensity exercise weekly Engage in dietary modifications by fewer sweetened foods & beverages  Follow Up Plan: Telephone follow up appointment with care management team member scheduled for: 08/08/21      Consent to CCM Services: Mr. Plemons was given information about Chronic Care Management services including:  CCM service includes personalized support from designated clinical staff supervised by his physician, including individualized plan of care and coordination with other care providers 24/7 contact phone numbers for assistance for urgent and routine care needs. Service will only be billed when office clinical staff spend 20 minutes or more in a month to coordinate care. Only one practitioner may furnish and bill the service in a calendar month. The patient may stop CCM services at any time (effective at the end of the month) by phone call to the office staff. The patient will be responsible for cost sharing (co-pay) of up to 20% of the service fee (after annual deductible is met).  Patient agreed to services and verbal consent obtained.   Plan: Telephone follow up appointment with care management team member scheduled for:  08/08/21 Future Appointments  Date Time Provider [place]  07/18/2021  9:00 AM [name], FNP [facility]  08/08/2021  2:00 PM [facility] PHARMACIST [facility]    Patient verbalizes  understanding of instructions and care plan provided today and agrees to view in [place]. Active MyChart status confirmed with patient.    Please call the care guide team at [phone] if you need to cancel or reschedule your appointment.   Kennon Holter, PharmD, [place], CPP Clinical Pharmacist Practitioner [facility] [phone]

## 2021-07-11 DIAGNOSIS — E782 Mixed hyperlipidemia: Secondary | ICD-10-CM

## 2021-07-11 DIAGNOSIS — E1141 Type 2 diabetes mellitus with diabetic mononeuropathy: Secondary | ICD-10-CM

## 2021-07-11 DIAGNOSIS — I10 Essential (primary) hypertension: Secondary | ICD-10-CM | POA: Diagnosis not present

## 2021-07-13 ENCOUNTER — Encounter: Payer: Self-pay | Admitting: Pharmacist

## 2021-07-14 ENCOUNTER — Other Ambulatory Visit: Payer: Self-pay | Admitting: Nurse Practitioner

## 2021-07-14 DIAGNOSIS — E782 Mixed hyperlipidemia: Secondary | ICD-10-CM

## 2021-07-14 LAB — TSH: TSH: 2.32 u[IU]/mL (ref 0.450–4.500)

## 2021-07-14 MED ORDER — ATORVASTATIN CALCIUM 40 MG PO TABS: ORAL_TABLET | ORAL | 3 refills | Status: DC

## 2021-07-14 NOTE — Progress Notes (Signed)
Please review labs with patient  LDL is not at goal patient should take atorvastatin 80 mg daily.  He should avoid taking alcohol while on this medication.  Patient should come fasting on the day of his next appointment so we can recheck his cholesterol level at that visit.  Liver enzyme is slightly elevated patient should avoid alcohol and Tylenol.  Vitamin D level and TSH is within normal limits. Thank you

## 2021-07-15 LAB — LIPID PANEL
Chol/HDL Ratio: 3.2 ratio (ref 0.0–5.0)
Cholesterol, Total: 189 mg/dL (ref 100–199)
HDL: 60 mg/dL (ref >39)
LDL Chol Calc (NIH): 102 mg/dL — ABNORMAL HIGH (ref 0–99)
Triglycerides: 153 mg/dL — ABNORMAL HIGH (ref 0–149)
VLDL Cholesterol Cal: 27 mg/dL (ref 5–40)

## 2021-07-15 LAB — MICROALBUMIN / CREATININE URINE RATIO
Creatinine, Urine: 106 mg/dL
Microalb/Creat Ratio: 6 mg/g creat (ref 0–29)
Microalbumin, Urine: 6.4 ug/mL

## 2021-07-15 LAB — CMP14+EGFR
ALT: 34 IU/L (ref 0–44)
AST: 26 IU/L (ref 0–40)
Albumin/Globulin Ratio: 2 (ref 1.2–2.2)
Albumin: 4.9 g/dL — ABNORMAL HIGH (ref 3.8–4.8)
Alkaline Phosphatase: 126 IU/L — ABNORMAL HIGH (ref 44–121)
BUN/Creatinine Ratio: 21 (ref 10–24)
BUN: 22 mg/dL (ref 8–27)
Bilirubin Total: 0.4 mg/dL (ref 0.0–1.2)
CO2: 25 mmol/L (ref 20–29)
Calcium: 9.9 mg/dL (ref 8.6–10.2)
Chloride: 100 mmol/L (ref 96–106)
Creatinine, Ser: 1.06 mg/dL (ref 0.76–1.27)
Globulin, Total: 2.5 g/dL (ref 1.5–4.5)
Glucose: 162 mg/dL — ABNORMAL HIGH (ref 70–99)
Potassium: 4.3 mmol/L (ref 3.5–5.2)
Sodium: 139 mmol/L (ref 134–144)
Total Protein: 7.4 g/dL (ref 6.0–8.5)
eGFR: 77 mL/min/{1.73_m2} (ref >59)

## 2021-07-15 LAB — VITAMIN D 25 HYDROXY (VIT D DEFICIENCY, FRACTURES): Vit D, 25-Hydroxy: 71.6 ng/mL (ref 30.0–100.0)

## 2021-07-18 ENCOUNTER — Ambulatory Visit: Payer: Medicare HMO | Admitting: Nurse Practitioner

## 2021-07-18 ENCOUNTER — Ambulatory Visit (INDEPENDENT_AMBULATORY_CARE_PROVIDER_SITE_OTHER): Payer: Medicare HMO | Admitting: Nurse Practitioner

## 2021-07-18 ENCOUNTER — Encounter: Payer: Self-pay | Admitting: Nurse Practitioner

## 2021-07-18 ENCOUNTER — Other Ambulatory Visit: Payer: Self-pay

## 2021-07-18 VITALS — BP 122/88 | HR 77 | Ht 64.0 in | Wt 157.1 lb

## 2021-07-18 DIAGNOSIS — E782 Mixed hyperlipidemia: Secondary | ICD-10-CM

## 2021-07-18 DIAGNOSIS — E1141 Type 2 diabetes mellitus with diabetic mononeuropathy: Secondary | ICD-10-CM

## 2021-07-18 DIAGNOSIS — R748 Abnormal levels of other serum enzymes: Secondary | ICD-10-CM

## 2021-07-18 DIAGNOSIS — Z23 Encounter for immunization: Secondary | ICD-10-CM

## 2021-07-18 DIAGNOSIS — I10 Essential (primary) hypertension: Secondary | ICD-10-CM | POA: Diagnosis not present

## 2021-07-18 DIAGNOSIS — Z0000 Encounter for general adult medical examination without abnormal findings: Secondary | ICD-10-CM

## 2021-07-18 MED ORDER — ATORVASTATIN CALCIUM 80 MG PO TABS: 80.0000 mg | ORAL_TABLET | Freq: Every day | ORAL | 3 refills | Status: DC

## 2021-07-18 NOTE — Assessment & Plan Note (Signed)
Annual exam as documented.  ?Counseling done include healthy lifestyle involving committing to 150 minutes of exercise per week, heart healthy diet, and attaining healthy weight. The importance of adequate sleep also discussed.  ?Regular use of seat belt and home safety were also discussed . ?Changes in health habits are decided on by patient with goals and time frames set for achieving them. ?Immunization and cancer screening  needs are specifically addressed at this visit.   ?

## 2021-07-18 NOTE — Assessment & Plan Note (Signed)
Patient educated on CDC recommendation for the vaccine. Verbal consent was obtained from the patient, vaccine administered by nurse, no sign of adverse reactions noted at this time. Patient education on arm soreness and use of tylenol or ibuprofen (if safe) for this patient  was discussed. Patient educated on the signs and symptoms of adverse effect and advise to contact the office if they occur.t.  °

## 2021-07-18 NOTE — Assessment & Plan Note (Signed)
rechek labs in 6 weeks. Pt told to avoid alcohol while taking atorvastatin, he verbalized understanding.  Avoid tylenol until lab is back to normal

## 2021-07-18 NOTE — Assessment & Plan Note (Signed)
DASH diet and commitment to daily physical activity for a minimum of 30 minutes discussed and encouraged, as a part of hypertension management. The importance of attaining a healthy weight is also discussed.  BP/Weight 07/18/2021 07/06/2021 06/19/2021 01/02/2021 11/23/2020 04/26/2020 01/28/2020  Systolic BP 122 127 142 138 118 124 -  Diastolic BP 88 77 78 75 64 76 -  Wt. (Lbs) 157.08 - 163 - 158.4 160.4 158  BMI 26.96 - 27.98 27.19 27.19 27.53 27.12

## 2021-07-18 NOTE — Patient Instructions (Addendum)
Please get your shingrix vaccine and covid booster vaccine at your pharmacy.  Flu vaccine today.  Take atorvastatin 80 mg daily Please get your labs done 3-5 days before your next visit.    It is important that you exercise regularly at least 30 minutes 5 times a week.  Think about what you will eat, plan ahead. Choose " clean, green, fresh or frozen" over canned, processed or packaged foods which are more sugary, salty and fatty. 70 to 75% of food eaten should be vegetables and fruit. Three meals at set times with snacks allowed between meals, but they must be fruit or vegetables. Aim to eat over a 12 hour period , example 7 am to 7 pm, and STOP after  your last meal of the day. Drink water,generally about 64 ounces per day, no other drink is as healthy. Fruit juice is best enjoyed in a healthy way, by EATING the fruit.  Thanks for choosing [facility], we consider it a privelige to serve you.

## 2021-07-18 NOTE — Progress Notes (Signed)
Established Patient Office Visit  Subjective:  Patient ID: Edward Mcgee, male    DOB: 25-Apr-1956  Age: 66 y.o. MRN: [id number]  CC:  Chief Complaint  Patient presents with   Annual Exam    cpe    HPI Edward Mcgee presents for annual physical exam. Pt is accompanied by a Spanish interpreter to today's visit.  He has been taking atorvastatin 27m daily. Pt told to start taking atorvastatin 827m He takes 4 beer a week. Pt educated on the need to avoid alcohol while taking atorvastatin , he verbalized understanding.    Past Medical History:  Diagnosis Date   DM (diabetes mellitus), type 2, uncontrolled 02/10/2013   GERD (gastroesophageal reflux disease)    Hyperlipidemia    Hypertension 06/19/2021    Past Surgical History:  Procedure Laterality Date   NO PAST SURGERIES      Family History  Problem Relation Age of Onset   Hypertension Mother    Breast cancer Sister    Diabetes type II Sister    Hypertension Sister    Breast cancer Sister    Diabetes Mellitus II Sister    Uterine cancer Sister    Diabetes Mellitus II Sister    Diabetes type II Brother    Diabetes Other    Colon cancer Neg Hx    Esophageal cancer Neg Hx    Rectal cancer Neg Hx    Stomach cancer Neg Hx    Liver cancer Neg Hx    Prostate cancer Neg Hx     Social History   Socioeconomic History   Marital status: Married    Spouse name: Not on file   Number of children: 3   Years of education: 11   Highest education level: Not on file  Occupational History   Occupation: Maintenance  Tobacco Use   Smoking status: Never   Smokeless tobacco: Never  Vaping Use   Vaping Use: Never used  Substance and Sexual Activity   Alcohol use: Yes    Comment: socially   Drug use: No   Sexual activity: Not on file  Other Topics Concern   Not on file  Social History Narrative   Lives with his wife, pt is retired.    Social Determinants of Health   Financial Resource Strain: Not on file  Food  Insecurity: Not on file  Transportation Needs: Not on file  Physical Activity: Not on file  Stress: Not on file  Social Connections: Not on file  Intimate Partner Violence: Not on file    Outpatient Medications Prior to Visit  Medication Sig Dispense Refill   acetaminophen (TYLENOL) 325 MG tablet Take 650 mg by mouth every 6 (six) hours as needed.     atorvastatin (LIPITOR) 40 MG tablet Take 8025maily. 90 tablet 3   blood glucose meter kit and supplies KIT Dispense based on patient and insurance preference. Use before breakfast and at bedtime. ICD E11.9 1 each 0   Cholecalciferol (VITAMIN D3 PO) Take by mouth.     empagliflozin (JARDIANCE) 10 MG TABS tablet Take 1 tablet (10 mg total) by mouth daily before breakfast. 30 tablet 3   glipiZIDE (GLUCOTROL XL) 5 MG 24 hr tablet Take 1 tablet (5 mg total) by mouth daily with breakfast. 90 tablet 3   glucose blood (ONETOUCH ULTRA) test strip USE 2 test strips,  TO CHECK GLUCOSE BID 100 each 3   lisinopril (ZESTRIL) 5 MG tablet Take 1 tablet (5 mg total) by mouth daily.  90 tablet 3   Magnesium 200 MG TABS Take 1 tablet by mouth daily.     omeprazole (PRILOSEC) 20 MG capsule Take 1 capsule by mouth once daily 90 capsule 0   ONETOUCH DELICA LANCETS FINE MISC      tamsulosin (FLOMAX) 0.4 MG CAPS capsule Take 1 capsule (0.4 mg total) by mouth daily. 90 capsule 3   vitamin E 1000 UNIT capsule Take 1,000 Units by mouth daily.     Zinc Sulfate (ZINC 15 PO) Take by mouth.     Facility-Administered Medications Prior to Visit  Medication Dose Route Frequency Provider Last Rate Last Admin   0.9 %  sodium chloride infusion  500 mL Intravenous Continuous Milus Banister, MD        Allergies  Allergen Reactions   Other Shortness Of Breath    Raspberry juice. Pt report SOB,itchy and swelling.    Peanut-Containing Drug Products Other (See Comments)    Itching, can breath and hives    ROS Review of Systems  Constitutional: Negative.   HENT:  Negative.    Eyes: Negative.   Respiratory: Negative.    Cardiovascular: Negative.   Gastrointestinal: Negative.   Endocrine: Negative.   Genitourinary: Negative.   Musculoskeletal: Negative.   Skin: Negative.   Allergic/Immunologic: Negative.   Neurological: Negative.   Hematological: Negative.   Psychiatric/Behavioral: Negative.       Objective:    Physical Exam Constitutional:      General: He is not in acute distress.    Appearance: He is normal weight. He is not ill-appearing, toxic-appearing or diaphoretic.  HENT:     Head: Atraumatic.     Right Ear: Tympanic membrane, ear canal and external ear normal. There is no impacted cerumen.     Left Ear: Tympanic membrane, ear canal and external ear normal. There is no impacted cerumen.     Nose: No congestion or rhinorrhea.     Mouth/Throat:     Mouth: Mucous membranes are moist.     Pharynx: No oropharyngeal exudate or posterior oropharyngeal erythema.  Eyes:     General: No scleral icterus.       Right eye: No discharge.        Left eye: No discharge.     Extraocular Movements: Extraocular movements intact.     Pupils: Pupils are equal, round, and reactive to light.  Neck:     Vascular: No carotid bruit.  Cardiovascular:     Rate and Rhythm: Normal rate and regular rhythm.     Pulses: Normal pulses.     Heart sounds: Normal heart sounds. No murmur heard.   No friction rub. No gallop.  Pulmonary:     Effort: No respiratory distress.     Breath sounds: No stridor. No wheezing, rhonchi or rales.  Chest:     Chest wall: No tenderness.  Abdominal:     General: There is no distension.     Palpations: Abdomen is soft. There is no mass.     Tenderness: There is no abdominal tenderness. There is no right CVA tenderness, left CVA tenderness, guarding or rebound.     Hernia: No hernia is present.  Musculoskeletal:        General: No swelling, tenderness, deformity or signs of injury.     Cervical back: Normal range of  motion and neck supple. No rigidity or tenderness.     Right lower leg: No edema.     Left lower leg: No edema.  Lymphadenopathy:     Cervical: No cervical adenopathy.  Skin:    General: Skin is warm and dry.     Capillary Refill: Capillary refill takes less than 2 seconds.     Coloration: Skin is not jaundiced or pale.     Findings: No bruising, erythema, lesion or rash.  Neurological:     Mental Status: He is alert and oriented to person, place, and time.     Cranial Nerves: No cranial nerve deficit.     Sensory: No sensory deficit.     Motor: No weakness.     Coordination: Coordination normal.     Gait: Gait normal.     Deep Tendon Reflexes: Reflexes normal.  Psychiatric:        Mood and Affect: Mood normal.        Behavior: Behavior normal.        Thought Content: Thought content normal.        Judgment: Judgment normal.    BP 122/88 (BP Location: Left Arm, Patient Position: Sitting, Cuff Size: Normal)    Pulse 77    Ht '5\' 4"'  (1.626 m)    Wt 157 lb 1.3 oz (71.3 kg)    SpO2 99%    BMI 26.96 kg/m  Wt Readings from Last 3 Encounters:  07/18/21 157 lb 1.3 oz (71.3 kg)  06/19/21 163 lb (73.9 kg)  11/23/20 158 lb 6.4 oz (71.8 kg)     Health Maintenance Due  Topic Date Due   Zoster Vaccines- Shingrix (1 of 2) Never done   INFLUENZA VACCINE  01/09/2021   COVID-19 Vaccine (5 - Booster for [place] series) 03/08/2021   FOOT EXAM  04/26/2021   OPHTHALMOLOGY EXAM  05/25/2021    There are no preventive care reminders to display for this patient.  Lab Results  Component Value Date   TSH 2.320 07/13/2021   Lab Results  Component Value Date   WBC 7.5 11/23/2020   HGB 14.3 11/23/2020   HCT 42.9 11/23/2020   MCV 83.5 11/23/2020   PLT 308.0 11/23/2020   Lab Results  Component Value Date   NA 139 07/13/2021   K 4.3 07/13/2021   CO2 25 07/13/2021   GLUCOSE 162 (H) 07/13/2021   BUN 22 07/13/2021   CREATININE 1.06 07/13/2021   BILITOT 0.4 07/13/2021   ALKPHOS 126 (H)  07/13/2021   AST 26 07/13/2021   ALT 34 07/13/2021   PROT 7.4 07/13/2021   ALBUMIN 4.9 (H) 07/13/2021   CALCIUM 9.9 07/13/2021   ANIONGAP 10 05/07/2016   EGFR 77 07/13/2021   GFR 89.77 11/23/2020   Lab Results  Component Value Date   CHOL 189 07/13/2021   Lab Results  Component Value Date   HDL 60 07/13/2021   Lab Results  Component Value Date   LDLCALC 102 (H) 07/13/2021   Lab Results  Component Value Date   TRIG 153 (H) 07/13/2021   Lab Results  Component Value Date   CHOLHDL 3.2 07/13/2021   Lab Results  Component Value Date   HGBA1C 8.5 (A) 06/19/2021      Assessment & Plan:   Problem List Items Addressed This Visit   None   No orders of the defined types were placed in this encounter.   Follow-up: No follow-ups on file.    [name], FNP

## 2021-07-18 NOTE — Assessment & Plan Note (Signed)
Start taking atorvastatin 80mg  daily. Recheck labs in 6 weeks

## 2021-07-18 NOTE — Assessment & Plan Note (Signed)
Started taking jardiance 2 weeks ago.  He is tolerating med well Foot exam today Has upcoming diabetic eye exam.

## 2021-07-19 LAB — CBC WITH DIFFERENTIAL/PLATELET
Basophils Absolute: 0 10*3/uL (ref 0.0–0.2)
Basos: 1 %
EOS (ABSOLUTE): 0.2 10*3/uL (ref 0.0–0.4)
Eos: 3 %
Hematocrit: 41.5 % (ref 37.5–51.0)
Hemoglobin: 14.1 g/dL (ref 13.0–17.7)
Immature Grans (Abs): 0 10*3/uL (ref 0.0–0.1)
Immature Granulocytes: 0 %
Lymphocytes Absolute: 1.7 10*3/uL (ref 0.7–3.1)
Lymphs: 21 %
MCH: 27.3 pg (ref 26.6–33.0)
MCHC: 34 g/dL (ref 31.5–35.7)
MCV: 80 fL (ref 79–97)
Monocytes Absolute: 0.7 10*3/uL (ref 0.1–0.9)
Monocytes: 9 %
Neutrophils Absolute: 5.5 10*3/uL (ref 1.4–7.0)
Neutrophils: 66 %
Platelets: 297 10*3/uL (ref 150–450)
RBC: 5.17 x10E6/uL (ref 4.14–5.80)
RDW: 12.4 % (ref 11.6–15.4)
WBC: 8.1 10*3/uL (ref 3.4–10.8)

## 2021-08-07 ENCOUNTER — Ambulatory Visit (INDEPENDENT_AMBULATORY_CARE_PROVIDER_SITE_OTHER): Payer: Medicare HMO

## 2021-08-07 ENCOUNTER — Other Ambulatory Visit: Payer: Self-pay

## 2021-08-07 VITALS — BP 130/77 | HR 75 | Ht 64.0 in | Wt 154.0 lb

## 2021-08-07 DIAGNOSIS — E1141 Type 2 diabetes mellitus with diabetic mononeuropathy: Secondary | ICD-10-CM

## 2021-08-07 DIAGNOSIS — Z0000 Encounter for general adult medical examination without abnormal findings: Secondary | ICD-10-CM

## 2021-08-07 NOTE — Progress Notes (Signed)
Subjective:   Edward Mcgee is a 66 y.o. male who presents for an Initial Medicare Annual Wellness Visit.  Review of Systems     Cardiac Risk Factors include: advanced age (>56mn, >>73women);sedentary lifestyle;male gender     Objective:    Today's Vitals   08/07/21 0845 08/07/21 0846  BP: 130/77   Pulse: 75   SpO2: 95%   Weight: 154 lb (69.9 kg)   Height: _0  (1.626 m)   PainSc:  0-No pain   Body mass index is 26.43 kg/m.  Advanced Directives 04/26/2020 12/29/2019 02/11/2019 03/13/2017 06/25/2016 05/07/2016 04/29/2016  Does Patient Have a Medical Advance Directive? _1  No No  Would patient like information on creating a medical advance directive? No - Patient declined No - Patient declined No - Patient declined No - Patient declined - No - Patient declined No - patient declined information    Current Medications (verified) Outpatient Encounter Medications as of 08/07/2021  Medication Sig   acetaminophen (TYLENOL) 325 MG tablet Take 650 mg by mouth every 6 (six) hours as needed.   atorvastatin (LIPITOR) 80 MG tablet Take 1 tablet (80 mg total) by mouth daily.   blood glucose meter kit and supplies KIT Dispense based on patient and insurance preference. Use before breakfast and at bedtime. ICD E11.9   Cholecalciferol (VITAMIN D3 PO) Take by mouth.   empagliflozin (JARDIANCE) 10 MG TABS tablet Take 1 tablet (10 mg total) by mouth daily before breakfast.   glipiZIDE (GLUCOTROL XL) 5 MG 24 hr tablet Take 1 tablet (5 mg total) by mouth daily with breakfast.   glucose blood (ONETOUCH ULTRA) test strip USE 2 test strips,  TO CHECK GLUCOSE BID   lisinopril (ZESTRIL) 5 MG tablet Take 1 tablet (5 mg total) by mouth daily.   Magnesium 200 MG TABS Take 1 tablet by mouth daily.   omeprazole (PRILOSEC) 20 MG capsule Take 1 capsule by mouth once daily   ONETOUCH DELICA LANCETS FINE MISC    tamsulosin (FLOMAX) 0.4 MG CAPS capsule Take 1 capsule (0.4 mg total) by mouth daily.    vitamin E 1000 UNIT capsule Take 1,000 Units by mouth daily.   Zinc Sulfate (ZINC 15 PO) Take by mouth.   Facility-Administered Encounter Medications as of 08/07/2021  Medication   0.9 %  sodium chloride infusion    Allergies (verified) Other and Peanut-containing drug products   History: Past Medical History:  Diagnosis Date   DM (diabetes mellitus), type 2, uncontrolled 02/10/2013   GERD (gastroesophageal reflux disease)    Hyperlipidemia    Hypertension 06/19/2021   Past Surgical History:  Procedure Laterality Date   NO PAST SURGERIES     Family History  Problem Relation Age of Onset   Hypertension Mother    Breast cancer Sister    Diabetes type II Sister    Hypertension Sister    Breast cancer Sister    Diabetes Mellitus II Sister    Uterine cancer Sister    Diabetes Mellitus II Sister    Diabetes type II Brother    Diabetes Other    Colon cancer Neg Hx    Esophageal cancer Neg Hx    Rectal cancer Neg Hx    Stomach cancer Neg Hx    Liver cancer Neg Hx    Prostate cancer Neg Hx    Social History   Socioeconomic History   Marital status: Married    Spouse name: Not on file   Number  of children: 3   Years of education: 11   Highest education level: Not on file  Occupational History   Occupation: Maintenance  Tobacco Use   Smoking status: Never   Smokeless tobacco: Never  Vaping Use   Vaping Use: Never used  Substance and Sexual Activity   Alcohol use: Yes    Comment: socially   Drug use: No   Sexual activity: Not on file  Other Topics Concern   Not on file  Social History Narrative   Lives with his wife, pt is retired.    Social Determinants of Health   Financial Resource Strain: Low Risk    Difficulty of Paying Living Expenses: Not hard at all  Food Insecurity: No Food Insecurity   Worried About Charity fundraiser in the Last Year: Never true   [place] in the Last Year: Never true  Transportation Needs: No Transportation Needs   Lack  of Transportation (Medical): No   Lack of Transportation (Non-Medical): No  Physical Activity: Not on file  Stress: Not on file  Social Connections: Moderately Integrated   Frequency of Communication with Friends and Family: More than three times a week   Frequency of Social Gatherings with Friends and Family: More than three times a week   Attends Religious Services: More than 4 times per year   Active Member of [employer] or Organizations: No   Attends Music therapist: Never   Marital Status: Married    Tobacco Counseling Counseling given: Not Answered   Clinical Intake:  Pre-visit preparation completed: No  Pain : No/denies pain Pain Score: 0-No pain     Nutritional Status: BMI 25 -29 Overweight Diabetes: Yes  What is the last grade level you completed in school?: 9th grade  Diabetic?Nutrition Risk Assessment:  Has the patient had any N/V/D within the last 2 months?  Yes  nausea on metformin Does the patient have any non-healing wounds?  No  Has the patient had any unintentional weight loss or weight gain?  No   Diabetes:  Is the patient diabetic?  Yes  If diabetic, was a CBG obtained today?  Yes 117  Did the patient bring in their glucometer from home?  No  How often do you monitor your CBG's? BID .   Financial Strains and Diabetes Management:  Are you having any financial strains with the device, your supplies or your medication? No .  Does the patient want to be seen by Chronic Care Management for management of their diabetes?  No  Would the patient like to be referred to a Nutritionist or for Diabetic Management?  No   Diabetic Exams:  Diabetic Eye Exam: Completed about  Diabetic Foot Exam: Completed      Interpreter Needed?: No yes, wife      Activities of Daily Living In your present state of health, do you have any difficulty performing the following activities: 08/07/2021  Hearing? N  Vision? N  Difficulty concentrating or making  decisions? N  Walking or climbing stairs? N  Dressing or bathing? N  Doing errands, shopping? N  Preparing Food and eating ? N  Using the Toilet? N  In the past six months, have you accidently leaked urine? N  Do you have problems with loss of bowel control? N  Managing your Medications? N  Managing your Finances? N  Housekeeping or managing your Housekeeping? N  Some recent data might be hidden    Patient Care Team: [place],  Dewaine Conger, FNP as PCP - General (Nurse Practitioner) Beryle Lathe, [facility] (Pharmacist)  Indicate any recent Medical Services you may have received from other than [facility] providers in the past year (date may be approximate).     Assessment:   This is a routine wellness examination for [place].  Hearing/Vision screen No results found.  Dietary issues and exercise activities discussed: Current Exercise Habits: The patient does not participate in regular exercise at present, Exercise limited by: orthopedic condition(s)   Goals Addressed   None    Depression Screen PHQ 2/9 Scores 08/07/2021 07/18/2021 06/19/2021 04/26/2020 07/27/2019 04/24/2019 08/20/2018  PHQ - 2 Score 0 0 0 0 0 0 0    Fall Risk Fall Risk  08/07/2021 07/18/2021 06/19/2021 11/23/2020 04/26/2020  Falls in the past year? 0 0 0 0 0  Number falls in past yr: 0 0 0 0 0  Comment - - - - -  Injury with Fall? 0 0 0 0 0  Comment - - - - -  Risk for fall due to : - No Fall Risks No Fall Risks - No Fall Risks  Follow up - Falls evaluation completed Falls evaluation completed - Falls evaluation completed    FALL RISK PREVENTION PERTAINING TO THE HOME:  Any stairs in or around the home? No  If so, are there any without handrails? No  Home free of loose throw rugs in walkways, pet beds, electrical cords, etc? Yes  Adequate lighting in your home to reduce risk of falls? Yes   ASSISTIVE DEVICES UTILIZED TO PREVENT FALLS:  Life alert? No  Use of a cane, walker or w/c? No  Grab bars in the  bathroom? No  Shower chair or bench in shower? No  Elevated toilet seat or a handicapped toilet? No   TIMED UP AND GO:  Was the test performed? Yes .  Length of time to ambulate 10 feet: 8 sec.   Gait steady and fast without use of assistive device  Cognitive Function:        Immunizations Immunization History  Administered Date(s) Administered   Fluad Quad(high Dose 65+) 04/24/2019, 07/18/2021   Influenza Whole 06/11/2006, 04/18/2007   Influenza, High Dose Seasonal PF 07/16/2017, 06/25/2018   Influenza,inj,Quad PF,6+ Mos 05/10/2016   PFIZER(Purple Top)SARS-COV-2 Vaccination 08/20/2019, 09/10/2019, 04/28/2020, 01/11/2021, 07/28/2021   Pneumococcal Conjugate-13 12/23/2017   Pneumococcal Polysaccharide-23 04/18/2007, 01/25/2020   Td 05/11/2006   Zoster, Live 12/05/2015    TDAP status: Up to date  Flu Vaccine status: Up to date  Pneumococcal vaccine status: Up to date  Covid-19 vaccine status: Completed vaccines all 5   Qualifies for Shingles Vaccine? Yes   Zostavax completed No   Shingrix Completed?: No.    Education has been provided regarding the importance of this vaccine. Patient has been advised to call insurance company to determine out of pocket expense if they have not yet received this vaccine. Advised may also receive vaccine at local pharmacy or Health Dept. Verbalized acceptance and understanding.  Screening Tests Health Maintenance  Topic Date Due   Zoster Vaccines- Shingrix (1 of 2) Never done   OPHTHALMOLOGY EXAM  05/25/2021   HEMOGLOBIN A1C  12/17/2021   FOOT EXAM  07/18/2022   TETANUS/TDAP  06/14/2024   COLONOSCOPY (Pts 45-17yr Insurance coverage will need to be confirmed)  06/25/2026   Pneumonia Vaccine 66 Years old  Completed   INFLUENZA VACCINE  Completed   COVID-19 Vaccine  Completed   Hepatitis C Screening  Completed  HPV VACCINES  Aged Out    Health Maintenance  Health Maintenance Due  Topic Date Due   Zoster Vaccines- Shingrix (1  of 2) Never done   OPHTHALMOLOGY EXAM  05/25/2021    Colorectal cancer screening: Type of screening: Colonoscopy. Completed 2018. Repeat every 10 years  Lung Cancer Screening: (Low Dose CT Chest recommended if Age 12-80 years, 30 pack-year currently smoking OR have quit w/in 15years.) does not qualify.   Lung Cancer Screening Referral: na  Additional Screening:  Hepatitis C Screening: does qualify; Completed yes  Vision Screening: Recommended annual ophthalmology exams for early detection of glaucoma and other disorders of the eye. Is the patient up to date with their annual eye exam?  No  Who is the provider or what is the name of the office in which the patient attends annual eye exams? [email or web address] If pt is not established with a provider, would they like to be referred to a provider to establish care? Yes .   Dental Screening: Recommended annual dental exams for proper oral hygiene  Community Resource Referral / Chronic Care Management: CRR required this visit?  No   CCM required this visit?  No      Plan:     I have personally reviewed and noted the following in the patients chart:   Medical and social history Use of alcohol, tobacco or illicit drugs  Current medications and supplements including opioid prescriptions. Patient is not currently taking opioid prescriptions. Functional ability and status Nutritional status Physical activity Advanced directives List of other physicians Hospitalizations, surgeries, and ER visits in previous 12 months Vitals Screenings to include cognitive, depression, and falls Referrals and appointments  In addition, I have reviewed and discussed with patient certain preventive protocols, quality metrics, and best practice recommendations. A written personalized care plan for preventive services as well as general preventive health recommendations were provided to patient.     Kate Sable, LPN, LPN   [id number]   Nurse Notes:  Mr.  Moomaw , Thank you for taking time to come for your Medicare Wellness Visit. I appreciate your ongoing commitment to your health goals. Please review the following plan we discussed and let me know if I can assist you in the future.   These are the goals we discussed:  Goals      Medication Management     Patient Goals/Self-Care Activities Patient will:  Take medications as prescribed Check blood sugar twice a day at the following times: fasting (at least 8 hours since last food consumption) and bedtime, document, and provide at future appointments Check blood pressure at least once daily, document, and provide at future appointments Collaborate with provider on medication access solutions Target a minimum of 150 minutes of moderate intensity exercise weekly Engage in dietary modifications by fewer sweetened foods & beverages        This is a list of the screening recommended for you and due dates:  Health Maintenance  Topic Date Due   Zoster (Shingles) Vaccine (1 of 2) Never done   Eye exam for diabetics  05/25/2021   Hemoglobin A1C  12/17/2021   Complete foot exam   07/18/2022   Tetanus Vaccine  06/14/2024   Colon Cancer Screening  06/25/2026   Pneumonia Vaccine  Completed   Flu Shot  Completed   COVID-19 Vaccine  Completed   Hepatitis C Screening: USPSTF Recommendation to screen - Ages 41-79 yo.  Completed   HPV Vaccine  Aged Out

## 2021-08-07 NOTE — Patient Instructions (Signed)
°  Edward Mcgee , Thank you for taking time to come for your Medicare Wellness Visit. I appreciate your ongoing commitment to your health goals. Please review the following plan we discussed and let me know if I can assist you in the future.   These are the goals we discussed:  Goals      Medication Management     Patient Goals/Self-Care Activities Patient will:  Take medications as prescribed Check blood sugar twice a day at the following times: fasting (at least 8 hours since last food consumption) and bedtime, document, and provide at future appointments Check blood pressure at least once daily, document, and provide at future appointments Collaborate with provider on medication access solutions Target a minimum of 150 minutes of moderate intensity exercise weekly Engage in dietary modifications by fewer sweetened foods & beverages        This is a list of the screening recommended for you and due dates:  Health Maintenance  Topic Date Due   Zoster (Shingles) Vaccine (1 of 2) Never done   Eye exam for diabetics  05/25/2021   Hemoglobin A1C  12/17/2021   Complete foot exam   07/18/2022   Tetanus Vaccine  06/14/2024   Colon Cancer Screening  06/25/2026   Pneumonia Vaccine  Completed   Flu Shot  Completed   COVID-19 Vaccine  Completed   Hepatitis C Screening: USPSTF Recommendation to screen - Ages 1-79 yo.  Completed   HPV Vaccine  Aged Out

## 2021-08-08 ENCOUNTER — Telehealth: Payer: Self-pay | Admitting: Pharmacist

## 2021-08-08 ENCOUNTER — Telehealth: Payer: Medicare HMO

## 2021-08-08 NOTE — Telephone Encounter (Signed)
°  Care Management   Follow Up Note  08/08/2021 Name: Edward Mcgee MRN: [id number] DOB: 1955-12-17  Referred by: Donell Beers, FNP Reason for referral : Chronic Care Management (Unsuccessful Telephone Outreach)  An unsuccessful telephone outreach was attempted today. The patient was referred to the case management team for assistance with care management and care coordination.   I called [employer] to access a Spanish interpretor. I was connected with Alfonzo Beers (ID#: [id number]) who called the patient and his wife but neither we available during our scheduled visit.   Follow Up Plan: The care management team will reach out to the patient again over the next 7 days.   Domenic Moras, PharmD, [place], CPP Clinical Pharmacist Practitioner [facility] [phone]

## 2021-08-09 ENCOUNTER — Other Ambulatory Visit: Payer: Self-pay | Admitting: Nurse Practitioner

## 2021-08-09 DIAGNOSIS — K2100 Gastro-esophageal reflux disease with esophagitis, without bleeding: Secondary | ICD-10-CM

## 2021-08-10 NOTE — Telephone Encounter (Signed)
Chart supports Rx ?Last seen 11/2020 ?No future appointment scheduled.  ?

## 2021-08-26 LAB — LIPID PANEL
Chol/HDL Ratio: 2.9 ratio (ref 0.0–5.0)
Cholesterol, Total: 135 mg/dL (ref 100–199)
HDL: 46 mg/dL (ref >39)
LDL Chol Calc (NIH): 62 mg/dL (ref 0–99)
Triglycerides: 157 mg/dL — ABNORMAL HIGH (ref 0–149)
VLDL Cholesterol Cal: 27 mg/dL (ref 5–40)

## 2021-08-26 LAB — CMP14+EGFR
ALT: 39 IU/L (ref 0–44)
AST: 30 IU/L (ref 0–40)
Albumin/Globulin Ratio: 2.1 (ref 1.2–2.2)
Albumin: 4.6 g/dL (ref 3.8–4.8)
Alkaline Phosphatase: 125 IU/L — ABNORMAL HIGH (ref 44–121)
BUN/Creatinine Ratio: 20 (ref 10–24)
BUN: 19 mg/dL (ref 8–27)
Bilirubin Total: 0.3 mg/dL (ref 0.0–1.2)
CO2: 24 mmol/L (ref 20–29)
Calcium: 9.4 mg/dL (ref 8.6–10.2)
Chloride: 105 mmol/L (ref 96–106)
Creatinine, Ser: 0.94 mg/dL (ref 0.76–1.27)
Globulin, Total: 2.2 g/dL (ref 1.5–4.5)
Glucose: 139 mg/dL — ABNORMAL HIGH (ref 70–99)
Potassium: 4.3 mmol/L (ref 3.5–5.2)
Sodium: 142 mmol/L (ref 134–144)
Total Protein: 6.8 g/dL (ref 6.0–8.5)
eGFR: 89 mL/min/{1.73_m2} (ref >59)

## 2021-08-28 ENCOUNTER — Telehealth: Payer: Self-pay | Admitting: *Deleted

## 2021-08-28 NOTE — Chronic Care Management (AMB) (Signed)
?  Care Management  ? ?Note ? ?08/28/2021 ?Name: Edward Mcgee MRN: [id number] DOB: Feb 18, 1956 ? ?Edward Mcgee is a 66 y.o. year old male who is a primary care patient of Donell Beers, FNP and is actively engaged with the care management team. I reached out to [employer] by phone today to assist with re-scheduling a follow up visit with the Pharmacist ? ?Follow up plan: ?Unsuccessful telephone outreach attempt made. A HIPAA compliant phone message was left for the patient providing contact information and requesting a return call.  ?The care management team will reach out to the patient again over the next 7 days.  ?If patient returns call to provider office, please advise to call Embedded Care Management Care Guide Misty Stanley  at [phone]. ? ?Gwenevere Ghazi  ?Care Guide, Embedded Care Coordination ?[place]  Care Management  ?Direct Dial: [phone] ? ?

## 2021-08-29 ENCOUNTER — Ambulatory Visit (INDEPENDENT_AMBULATORY_CARE_PROVIDER_SITE_OTHER): Payer: Medicare HMO | Admitting: Nurse Practitioner

## 2021-08-29 ENCOUNTER — Encounter: Payer: Self-pay | Admitting: Nurse Practitioner

## 2021-08-29 ENCOUNTER — Other Ambulatory Visit: Payer: Self-pay

## 2021-08-29 VITALS — BP 120/70 | HR 69 | Ht 64.0 in | Wt 156.0 lb

## 2021-08-29 DIAGNOSIS — R748 Abnormal levels of other serum enzymes: Secondary | ICD-10-CM

## 2021-08-29 DIAGNOSIS — E782 Mixed hyperlipidemia: Secondary | ICD-10-CM | POA: Diagnosis not present

## 2021-08-29 DIAGNOSIS — I10 Essential (primary) hypertension: Secondary | ICD-10-CM | POA: Diagnosis not present

## 2021-08-29 DIAGNOSIS — E1141 Type 2 diabetes mellitus with diabetic mononeuropathy: Secondary | ICD-10-CM

## 2021-08-29 NOTE — Patient Instructions (Signed)

## 2021-08-29 NOTE — Assessment & Plan Note (Addendum)
BP Readings from Last 3 Encounters:  ?08/29/21 120/70  ?08/07/21 130/77  ?07/18/21 122/88  ?Condition well-controlled, on lisinopril 5 mg daily ?DASH diet advised, exercise 30 minutes 5 days a week. ?Normal BMP. ? ?

## 2021-08-29 NOTE — Assessment & Plan Note (Signed)
Patient reports that his blood sugar has been well controlled at home states fasting blood sugar is around 110, 115 ?Denies hypoglycemia. ?Continue Jardiance 10 mg daily, glipizide 5 mg daily ?Check A1c at next visit ?

## 2021-08-29 NOTE — Assessment & Plan Note (Signed)
Lab Results  ?Component Value Date  ? CHOL 135 08/25/2021  ? HDL 46 08/25/2021  ? LDLCALC 62 08/25/2021  ? LDLDIRECT 97.0 11/23/2020  ? TRIG 157 (H) 08/25/2021  ? CHOLHDL 2.9 08/25/2021  ?Condition well-controlled on atorvastatin 80 mg daily ?Continue current medication avoid fried fatty foods. ?

## 2021-08-29 NOTE — Assessment & Plan Note (Signed)
ALP down by one-point from 126 to 125, patient denies abdominal pain. ?We will continue to monitor for now, avoid alcohol and tylenol if by next labs number remains elevated will send in a referral for GI ?

## 2021-08-29 NOTE — Progress Notes (Signed)
? ?  Edward Mcgee     MRN: [id number]      DOB: [birth year] ? ? ?HPI ?Edward Mcgee is here for follow up  for hyperlipidemia. Pt states that He has been taking all medications as prescribed, pt denies any new concerns or adverse reactions to medications.  Patient is not accompanied by a Spanish interpreter today.  Patient accompanied by his wife, they states that they did not need an interpreter today.  ? ?Patient states that he has taken 1 dose of shingles vaccine and will get the next dose when due.  ? ? ? ? ?ROS ?Denies recent fever or chills. ?Denies sinus pressure, nasal congestion, ear pain or sore throat. ?Denies chest congestion, productive cough or wheezing. ?Denies chest pains, palpitations and leg swelling ?Denies abdominal pain, nausea, vomiting,diarrhea or constipation.   ?Denies dysuria, frequency, hesitancy or incontinence. ?Denies joint pain, swelling and limitation in mobility. ?Denies headaches, seizures, numbness, or tingling. ?Denies depression, anxiety or insomnia. ? ? ?PE ? ?BP 120/70 (BP Location: Left Arm, Cuff Size: Large)   Pulse 69   Ht 5\' 4"  (1.626 m)   Wt 156 lb (70.8 kg)   SpO2 99%   BMI 26.78 kg/m?  ? ?Patient alert and oriented and in no cardiopulmonary distress. ? ? ?Chest: Clear to auscultation bilaterally. ? ?CVS: S1, S2 no murmurs, no S3.Regular rate. ? ?ABD: Soft non tender.  ? ?Ext: No edema ? ?MS: Adequate ROM spine, shoulders, hips and knees. ? ?Skin: Intact, no ulcerations or rash noted. ? ?Psych: Good eye contact, normal affect. Memory intact not anxious or depressed appearing. ? ? ? ? ?Assessment & Plan ?Hypertension ?BP Readings from Last 3 Encounters:  ?08/29/21 120/70  ?08/07/21 130/77  ?07/18/21 122/88  ?Condition well-controlled, on lisinopril 5 mg daily ?DASH diet advised, exercise 30 minutes 5 days a week. ?Normal BMP. ? ? ?DM (diabetes mellitus) (HCC) ?Patient reports that his blood sugar has been well controlled at home states fasting blood sugar is around 110,  115 ?Denies hypoglycemia. ?Continue Jardiance 10 mg daily, glipizide 5 mg daily ?Check A1c at next visit ? ?Hyperlipidemia ?Lab Results  ?Component Value Date  ? CHOL 135 08/25/2021  ? HDL 46 08/25/2021  ? LDLCALC 62 08/25/2021  ? LDLDIRECT 97.0 11/23/2020  ? TRIG 157 (H) 08/25/2021  ? CHOLHDL 2.9 08/25/2021  ?Condition well-controlled on atorvastatin 80 mg daily ?Continue current medication avoid fried fatty foods. ? ?Elevated alkaline phosphatase level ?ALP down by one-point from 126 to 125, patient denies abdominal pain. ?We will continue to monitor for now, avoid alcohol and tylenol if by next labs number remains elevated will send in a referral for GI  ? ?

## 2021-08-31 NOTE — Chronic Care Management (AMB) (Signed)
?  Care Management  ? ?Note ? ?08/31/2021 ?Name: Khiyan Crace MRN: [id number] DOB: 1956-05-22 ? ?Beniah Dicenzo is a 66 y.o. year old male who is a primary care patient of Donell Beers, FNP and is actively engaged with the care management team. I reached out to [employer] by phone today to assist with scheduling a follow up visit with the Pharmacist ? ?Follow up plan: ?Telephone appointment with care management team member scheduled for:09/01/21 ? ?Gwenevere Ghazi  ?Care Guide, Embedded Care Coordination ?[place]  Care Management  ?Direct Dial: [phone] ? ? ? ?

## 2021-09-01 ENCOUNTER — Ambulatory Visit (INDEPENDENT_AMBULATORY_CARE_PROVIDER_SITE_OTHER): Payer: Medicare HMO | Admitting: Pharmacist

## 2021-09-01 DIAGNOSIS — I10 Essential (primary) hypertension: Secondary | ICD-10-CM

## 2021-09-01 DIAGNOSIS — E1141 Type 2 diabetes mellitus with diabetic mononeuropathy: Secondary | ICD-10-CM

## 2021-09-01 DIAGNOSIS — E782 Mixed hyperlipidemia: Secondary | ICD-10-CM

## 2021-09-01 NOTE — Patient Instructions (Signed)
Satchel Boxer, ? ?It was great to talk to you today! ? ?For prescription refills through the [facility], call [phone]. Once you have been enrolled in the program, your prescriptions can easily be refilled by contacting the phone number above Monday though Friday 8:30 AM - 6:00 PM.  ? ?Please call me with any questions or concerns. ? ?Visit Information ? ?Following are the goals we discussed today:  ? Goals Addressed   ? ?  ?  ?  ?  ? This Visit's Progress  ?  Medication Management     ?  Patient Goals/Self-Care Activities ?Patient will:  ?Take medications as prescribed ?Check blood sugar twice a day at the following times: fasting (at least 8 hours since last food consumption) and bedtime, document, and provide at future appointments ?Check blood pressure at least once daily, document, and provide at future appointments ?Collaborate with provider on medication access solutions ?Target a minimum of 150 minutes of moderate intensity exercise weekly ?Engage in dietary modifications by fewer sweetened foods & beverages ? ?  ? ?  ?  ? ?Follow-up plan: Next PCP appointment scheduled for: 11/14/21 ? ?Patient verbalizes understanding of instructions and care plan provided today and agrees to view in [place]. Active MyChart status confirmed with patient.   ? ?Please call the care guide team at [phone] if you need to cancel or reschedule your appointment.  ? ?Kennon Holter, PharmD, BCACP, CPP ?Clinical Pharmacist Practitioner ?[place] ?[phone]  ?

## 2021-09-01 NOTE — Chronic Care Management (AMB) (Signed)
? ? ?Chronic Care Management ?Pharmacy Note ? ?09/01/2021 ?Name:  Edward Mcgee MRN:  [id number] DOB:  Oct 12, 1955 ? ?Summary: ?Type 2 Diabetes ?Uncontrolled but improving per finger stick blood glucose; Most recent A1c was above goal of <7% per ADA guidelines ?Patient has been approved to receive Jardiance through [place] patient assistance program until 06/10/22. Patient reports he has received his first shipment which included a 3 month supply. Patient aware to call [facility] [phone]) for refills when he has about 1 month supply left.  ?Repeat BMP stable/improved after a few weeks on Jardiance ?Current glucose readings:  improved to low 100s without hypoglycemia ?Continue empagliflozin (Jardiance) 10 mg by mouth once daily and glipizide XL 5 mg by mouth once daily ?Recheck A1c at next visit ? ?Hypertension ?Blood pressure is at goal of <130/80 mmHg per 2017 AHA/ACC guidelines. ?Continue lisinopril 5 mg by mouth once daily ? ?Hyperlipidemia ?Stable/Improved. LDL has now improved and is at goal of <70 due to very high risk given diabetes + at least 1 additional major risk factor (hypertension) per 2020 AACE/ACE guidelines. Triglycerides remain slightly above goal of <150 per 2020 AACE/ACE guidelines. ?Continue atorvastatin 80 mg by mouth once daily ?Would be reasonable to consider Vascepa for triglycerides as well based on REDUCE-IT study but cost prohibitive currently.  ? ?Subjective: ?Edward Mcgee is an 66 y.o. year old male who is a primary patient of [name], FNP.  The CCM team was consulted for assistance with disease management and care coordination needs.   ? ?Engaged with patient by telephone for follow up visit in response to provider referral for pharmacy case management and/or care coordination services.  ? ?Consent to Services:  ?The patient was given information about Chronic Care Management services, agreed to services, and gave verbal consent prior to initiation of services.  Please  see initial visit note for detailed documentation.  ? ?Patient Care Team: ?[name], FNP as PCP - General (Nurse Practitioner) ?Beryle Lathe, [facility] (Pharmacist) ? ?Objective: ? ?Lab Results  ?Component Value Date  ? CREATININE 0.94 08/25/2021  ? CREATININE 1.06 07/13/2021  ? CREATININE 0.88 11/23/2020  ? ? ?Lab Results  ?Component Value Date  ? HGBA1C 8.5 (A) 06/19/2021  ? ?Last diabetic Eye exam:  ?Lab Results  ?Component Value Date/Time  ? HMDIABEYEEXA No Retinopathy 05/25/2020 12:00 AM  ? HMDIABEYEEXA No Retinopathy 05/25/2020 12:00 AM  ?  ?Last diabetic Foot exam: No results found for: HMDIABFOOTEX  ? ?   ?Component Value Date/Time  ? CHOL 135 08/25/2021 1513  ? TRIG 157 (H) 08/25/2021 1513  ? HDL 46 08/25/2021 1513  ? CHOLHDL 2.9 08/25/2021 1513  ? CHOLHDL 3 11/23/2020 1443  ? VLDL 48.4 (H) 11/23/2020 1443  ? [place] 62 08/25/2021 1513  ? LDLDIRECT 97.0 11/23/2020 1443  ? ? ? ?  Latest Ref Rng & Units 08/25/2021  ?  3:13 PM 07/13/2021  ?  8:10 AM 11/23/2020  ?  2:43 PM  ?Hepatic Function  ?Total Protein 6.0 - 8.5 g/dL 6.8   7.4   7.2    ?Albumin 3.8 - 4.8 g/dL 4.6   4.9   4.6    ?AST 0 - 40 IU/L _0 ?ALT 0 - 44 IU/L 39   34   24    ?Alk Phosphatase 44 - 121 IU/L 125   126   80    ?Total Bilirubin 0.0 - 1.2 mg/dL 0.3  0.4   0.3    ? ? ?Lab Results  ?Component Value Date/Time  ? TSH 2.320 07/13/2021 08:09 AM  ? TSH 1.75 07/16/2017 09:04 AM  ? ? ? ?  Latest Ref Rng & Units 07/18/2021  ? 10:32 AM 11/23/2020  ?  2:43 PM 04/24/2019  ?  1:48 PM  ?CBC  ?WBC 3.4 - 10.8 x10E3/uL 8.1   7.5   7.6    ?Hemoglobin 13.0 - 17.7 g/dL 14.1   14.3   13.2    ?Hematocrit 37.5 - 51.0 % 41.5   42.9   40.6    ?Platelets 150 - 450 x10E3/uL 297   308.0   262.0    ? ? ?Lab Results  ?Component Value Date/Time  ? VD25OH 71.6 07/13/2021 08:10 AM  ? ? ?Clinical ASCVD: No  ?The 10-year ASCVD risk score (Arnett DK, et al., 2019) is: 22.9% ?  Values used to calculate the score: ?    Age: 66 years ?    Sex: Male ?    Is  Non-Hispanic African American: No ?    Diabetic: Yes ?    Tobacco smoker: No ?    Systolic Blood Pressure: 967 mmHg ?    Is BP treated: Yes ?    HDL Cholesterol: 46 mg/dL ?    Total Cholesterol: 135 mg/dL   ? ?Social History  ? ?Tobacco Use  ?Smoking Status Never  ?Smokeless Tobacco Never  ? ?BP Readings from Last 3 Encounters:  ?08/29/21 120/70  ?08/07/21 130/77  ?07/18/21 122/88  ? ?Pulse Readings from Last 3 Encounters:  ?08/29/21 69  ?08/07/21 75  ?07/18/21 77  ? ?Wt Readings from Last 3 Encounters:  ?08/29/21 156 lb (70.8 kg)  ?08/07/21 154 lb (69.9 kg)  ?07/18/21 157 lb 1.3 oz (71.3 kg)  ? ? ?Assessment: Review of patient past medical history, allergies, medications, health status, including review of consultants reports, laboratory and other test data, was performed as part of comprehensive evaluation and provision of chronic care management services.  ? ?SDOH:  (Social Determinants of Health) assessments and interventions performed:  ? ? ?CCM Care Plan ? ?Allergies  ?Allergen Reactions  ? Other Shortness Of Breath  ?  Raspberry juice. Pt report SOB,itchy and swelling.   ? Peanut-Containing Drug Products Other (See Comments)  ?  Itching, can breath and hives  ? ? ?Medications Reviewed Today   ? ? Reviewed by Beryle Lathe, [facility] (Pharmacist) on 09/01/21 at 1336  Med List Status: <None>  ? ?Medication Order Taking? Sig Documenting Provider Last Dose Status Informant  ?0.9 %  sodium chloride infusion [id number]   Milus Banister, MD  Active   ?acetaminophen (TYLENOL) 325 MG tablet [id number] Yes Take 650 mg by mouth every 6 (six) hours as needed. [provider] Taking Active   ?atorvastatin (LIPITOR) 80 MG tablet [id number] Yes Take 1 tablet (80 mg total) by mouth daily. [name], FNP Taking Active   ?blood glucose meter kit and supplies KIT [id number] Yes Dispense based on patient and insurance preference. Use before breakfast and at bedtime. ?ICD E11.9 [name], [name], NP Taking  Active   ?Cholecalciferol (VITAMIN D3 PO) [id number] No Take by mouth.  ?Patient not taking: Reported on 08/29/2021  ? [provider] Not Taking Active   ?empagliflozin (JARDIANCE) 10 MG TABS tablet [id number] Yes Take 1 tablet (10 mg total) by mouth daily before breakfast. [place], Dewaine Conger, FNP Taking Active   ?         ?  Med Note Waldo Laine, Gwenyth Allegra   Fri Sep 01, 2021  1:36 PM) Frances May[place] from [place] patient assistance program  ?glipiZIDE (GLUCOTROL XL) 5 MG 24 hr tablet [id number] Yes Take 1 tablet (5 mg total) by mouth daily with breakfast. [name], [name], NP Taking Active   ?glucose blood (ONETOUCH ULTRA) test strip [id number] Yes USE 2 test strips,  TO CHECK GLUCOSE BID [name], [name], NP Taking Active   ?lisinopril (ZESTRIL) 5 MG tablet [id number] Yes Take 1 tablet (5 mg total) by mouth daily. [name], FNP Taking Active   ?Magnesium 200 MG TABS [id number] No Take 1 tablet by mouth daily.  ?Patient not taking: Reported on 08/29/2021  ? [provider] Not Taking Active Self  ?omeprazole (PRILOSEC) 20 MG capsule [id number] Yes Take 1 capsule by mouth once daily [name], [name], NP Taking Active   ?[facility] DELICA LANCETS [place] [id number] Yes  [provider] Taking Active   ?tamsulosin (FLOMAX) 0.4 MG CAPS capsule [id number] No Take 1 capsule (0.4 mg total) by mouth daily.  ?Patient not taking: Reported on 08/29/2021  ? Flossie Buffy, NP Not Taking Active   ?vitamin E 1000 UNIT capsule [id number] Yes Take 1,000 Units by mouth daily. [provider] Taking Active   ?Zinc Sulfate (ZINC 15 PO) [id number] Yes Take by mouth. [provider] Taking Active   ? ?  ?  ? ?  ? ? ?Patient Active Problem List  ? Diagnosis Date Noted  ? Annual physical exam 07/18/2021  ? Elevated alkaline phosphatase level 07/18/2021  ? Need for immunization against influenza 07/18/2021  ? Hypertension 06/19/2021  ? Atrophy of quadriceps femoris muscle 01/30/2019  ?  GERD (gastroesophageal reflux disease) 12/24/2017  ? Groin rash 12/24/2017  ? Benign prostatic hyperplasia with incomplete bladder emptying 10/23/2017  ? Positive fecal occult blood test 10/23/2017  ? Vo

## 2021-09-08 DIAGNOSIS — I10 Essential (primary) hypertension: Secondary | ICD-10-CM

## 2021-09-08 DIAGNOSIS — E782 Mixed hyperlipidemia: Secondary | ICD-10-CM

## 2021-09-08 DIAGNOSIS — E1141 Type 2 diabetes mellitus with diabetic mononeuropathy: Secondary | ICD-10-CM

## 2021-10-06 ENCOUNTER — Other Ambulatory Visit: Payer: Self-pay

## 2021-10-06 MED ORDER — BD SWAB SINGLE USE REGULAR PADS: MEDICATED_PAD | 2 refills | Status: DC

## 2021-10-06 MED ORDER — TRUEPLUS LANCETS 33G MISC
2 refills | Status: DC
Start: 2021-10-06 — End: 2021-12-20

## 2021-10-10 ENCOUNTER — Other Ambulatory Visit: Payer: Self-pay

## 2021-10-10 DIAGNOSIS — E1165 Type 2 diabetes mellitus with hyperglycemia: Secondary | ICD-10-CM

## 2021-10-10 DIAGNOSIS — E782 Mixed hyperlipidemia: Secondary | ICD-10-CM

## 2021-10-10 DIAGNOSIS — I10 Essential (primary) hypertension: Secondary | ICD-10-CM

## 2021-10-10 DIAGNOSIS — K2100 Gastro-esophageal reflux disease with esophagitis, without bleeding: Secondary | ICD-10-CM

## 2021-10-10 MED ORDER — ATORVASTATIN CALCIUM 80 MG PO TABS: 80.0000 mg | ORAL_TABLET | Freq: Every day | ORAL | 3 refills | Status: DC

## 2021-10-10 MED ORDER — LISINOPRIL 5 MG PO TABS: 5.0000 mg | ORAL_TABLET | Freq: Every day | ORAL | 3 refills | Status: DC

## 2021-10-10 MED ORDER — GLIPIZIDE ER 5 MG PO TB24: 5.0000 mg | ORAL_TABLET | Freq: Every day | ORAL | 3 refills | Status: DC

## 2021-10-10 MED ORDER — OMEPRAZOLE 20 MG PO CPDR: 20.0000 mg | DELAYED_RELEASE_CAPSULE | Freq: Every day | ORAL | 2 refills | Status: DC

## 2021-10-24 LAB — HM DIABETES EYE EXAM

## 2021-10-25 ENCOUNTER — Encounter: Payer: Self-pay | Admitting: Nurse Practitioner

## 2021-10-26 ENCOUNTER — Encounter: Payer: Self-pay | Admitting: Nurse Practitioner

## 2021-10-31 ENCOUNTER — Other Ambulatory Visit: Payer: Self-pay

## 2021-11-03 ENCOUNTER — Ambulatory Visit: Payer: Self-pay

## 2021-11-03 ENCOUNTER — Ambulatory Visit: Payer: Medicare HMO | Admitting: Orthopedic Surgery

## 2021-11-03 ENCOUNTER — Encounter: Payer: Self-pay | Admitting: Orthopedic Surgery

## 2021-11-03 DIAGNOSIS — G8929 Other chronic pain: Secondary | ICD-10-CM

## 2021-11-03 DIAGNOSIS — M25861 Other specified joint disorders, right knee: Secondary | ICD-10-CM | POA: Diagnosis not present

## 2021-11-03 MED ORDER — LIDOCAINE HCL 1 % IJ SOLN
5.0000 mL | INTRAMUSCULAR | Status: AC | PRN
  Administered 2021-11-03: 5 mL

## 2021-11-03 NOTE — Progress Notes (Signed)
Office Visit Note   Patient: Edward Mcgee           Date of Birth: 02-23-1956           MRN: [id number] Visit Date: 11/03/2021 Requested by: [name], [address] [place],  [place] [id number] PCP: [name], FNP  Subjective: Chief Complaint  Patient presents with   Right Knee - Pain    HPI: Patient presents with recurrent pain in the right knee.  He last had the right knee ganglion cyst on the lateral aspect of his knee aspirated and injected in January.  This gave him about 3 months relief.  Prior aspiration and injection gave him about a year and a half relief.  He does have a bilobed ganglion cyst around the lateral aspect of the knee which has been evaluated by MRI scanning.  In general this cyst is not very symptomatic.  Bothers him when he mows the lawn or does heavy activity.  Does not take any medication for the pain.              ROS: All systems reviewed are negative as they relate to the chief complaint within the history of present illness.  Patient denies  fevers or chills.   Assessment & Plan: Visit Diagnoses:  1. Chronic pain of right knee     Plan: Impression is recurrent right knee ganglion cyst which is on the larger side.  Aspiration performed today with injection of Toradol.  We also tried to decompress that cyst with the needle to prevent recurrence and mass effect of the cyst on surrounding structures.  Did discuss with him at length the risk and benefits of operative and nonoperative intervention.  In general my sense is he would rather just get this aspirated and injected a couple times a year as opposed to surgical intervention.  We will see him back as needed for repeat aspiration and injection as needed.  Follow-Up Instructions: Return if symptoms worsen or fail to improve.   Orders:  Orders Placed This Encounter  Procedures   US Guided Needle Placement - No Linked Charges   No orders of the defined types  were placed in this encounter.     Procedures: Large Joint Inj: L knee on 11/03/2021 4:39 PM Indications: diagnostic evaluation, joint swelling and pain Details: 18 G 1.5 in needle, ultrasound-guided superolateral approach  Arthrogram: No  Medications: 5 mL lidocaine 1 % Outcome: tolerated well, no immediate complications Procedure, treatment alternatives, risks and benefits explained, specific risks discussed. Consent was given by the patient. Immediately prior to procedure a time out was called to verify the correct patient, procedure, equipment, support staff and site/side marked as required. Patient was prepped and draped in the usual sterile fashion.    Lateral ganglion cyst aspirated and injected with Toradol  Clinical Data: No additional findings.  Objective: Vital Signs: There were no vitals taken for this visit.  Physical Exam:   Constitutional: Patient appears well-developed HEENT:  Head: Normocephalic Eyes:EOM are normal Neck: Normal range of motion Cardiovascular: Normal rate Pulmonary/chest: Effort normal Neurologic: Patient is alert Skin: Skin is warm Psychiatric: Patient has normal mood and affect   Ortho Exam: Ortho exam demonstrates full active and passive range of motion of the right knee.  He has a bilobed cyst on the dorsal and ventral aspect of the iliotibial band at the the level of the lateral femoral condyle.  Ankle dorsiflexion strength intact with no  paresthesias in the foot.  No effusion in the knee.  Extensor mechanism intact.  Collateral cruciate ligaments are stable.  Specialty Comments:  No specialty comments available.  Imaging: US Guided Needle Placement - No Linked Charges  Result Date: 11/03/2021 Ultrasound imaging demonstrates needle placement into ganglion cyst on the right knee with decompression of cyst and injection of Toradol with no complicating features    PMFS History: Patient Active Problem List   Diagnosis Date Noted    Annual physical exam 07/18/2021   Elevated alkaline phosphatase level 07/18/2021   Need for immunization against influenza 07/18/2021   Hypertension 06/19/2021   Atrophy of quadriceps femoris muscle 01/30/2019   GERD (gastroesophageal reflux disease) 12/24/2017   Groin rash 12/24/2017   Benign prostatic hyperplasia with incomplete bladder emptying 10/23/2017   Positive fecal occult blood test 10/23/2017   Vocal process granuloma 03/01/2017   Laryngopharyngeal reflux (LPR) 03/01/2017   Seborrheic dermatitis of scalp 08/16/2016   Muscle cramps 05/27/2015   Leg pain, lateral, right 05/27/2015   AV block, 1st degree 02/10/2013   DM (diabetes mellitus) ([place]) 02/10/2013   Hyperlipidemia 02/25/2007   BACK PAIN, LUMBAR, CHRONIC 05/31/2006   Past Medical History:  Diagnosis Date   DM (diabetes mellitus), type 2, uncontrolled 02/10/2013   GERD (gastroesophageal reflux disease)    Hyperlipidemia    Hypertension 06/19/2021    Family History  Problem Relation Age of Onset   Hypertension Mother    Breast cancer Sister    Diabetes type II Sister    Hypertension Sister    Breast cancer Sister    Diabetes Mellitus II Sister    Uterine cancer Sister    Diabetes Mellitus II Sister    Diabetes type II Brother    Diabetes Other    Colon cancer Neg Hx    Esophageal cancer Neg Hx    Rectal cancer Neg Hx    Stomach cancer Neg Hx    Liver cancer Neg Hx    Prostate cancer Neg Hx     Past Surgical History:  Procedure Laterality Date   NO PAST SURGERIES     Social History   Occupational History   Occupation: Maintenance  Tobacco Use   Smoking status: Never   Smokeless tobacco: Never  Vaping Use   Vaping Use: Never used  Substance and Sexual Activity   Alcohol use: Yes    Comment: socially   Drug use: No   Sexual activity: Not on file

## 2021-11-14 ENCOUNTER — Ambulatory Visit (INDEPENDENT_AMBULATORY_CARE_PROVIDER_SITE_OTHER): Payer: Medicare HMO | Admitting: Nurse Practitioner

## 2021-11-14 ENCOUNTER — Encounter: Payer: Self-pay | Admitting: Nurse Practitioner

## 2021-11-14 VITALS — BP 121/80 | HR 68 | Ht 64.0 in | Wt 152.0 lb

## 2021-11-14 DIAGNOSIS — E782 Mixed hyperlipidemia: Secondary | ICD-10-CM

## 2021-11-14 DIAGNOSIS — E118 Type 2 diabetes mellitus with unspecified complications: Secondary | ICD-10-CM

## 2021-11-14 DIAGNOSIS — I10 Essential (primary) hypertension: Secondary | ICD-10-CM | POA: Diagnosis not present

## 2021-11-14 LAB — HEMOGLOBIN A1C
Est. average glucose Bld gHb Est-mCnc: 151 mg/dL
Hgb A1c MFr Bld: 6.9 % — ABNORMAL HIGH (ref 4.8–5.6)

## 2021-11-14 LAB — CMP14+EGFR
ALT: 22 IU/L (ref 0–44)
AST: 20 IU/L (ref 0–40)
Albumin/Globulin Ratio: 2.2 (ref 1.2–2.2)
Albumin: 4.7 g/dL (ref 3.8–4.8)
Alkaline Phosphatase: 104 IU/L (ref 44–121)
BUN/Creatinine Ratio: 25 — ABNORMAL HIGH (ref 10–24)
BUN: 21 mg/dL (ref 8–27)
Bilirubin Total: 0.5 mg/dL (ref 0.0–1.2)
CO2: 24 mmol/L (ref 20–29)
Calcium: 9.7 mg/dL (ref 8.6–10.2)
Chloride: 103 mmol/L (ref 96–106)
Creatinine, Ser: 0.83 mg/dL (ref 0.76–1.27)
Globulin, Total: 2.1 g/dL (ref 1.5–4.5)
Glucose: 107 mg/dL — ABNORMAL HIGH (ref 70–99)
Potassium: 4 mmol/L (ref 3.5–5.2)
Sodium: 142 mmol/L (ref 134–144)
Total Protein: 6.8 g/dL (ref 6.0–8.5)
eGFR: 96 mL/min/{1.73_m2} (ref >59)

## 2021-11-14 NOTE — Assessment & Plan Note (Signed)
BP Readings from Last 3 Encounters:  11/14/21 121/80  08/29/21 120/70  08/07/21 130/77  Chronic condition well-controlled on Jardiance 10 mg daily, lisinopril 5 mg daily Continue current medications DASH diet advised engage in regular exercises at least for 50 minutes weekly Follow-up in 6 months

## 2021-11-14 NOTE — Progress Notes (Signed)
   Edward Mcgee     MRN: [id number]      DOB: 1956-01-26   HPI Mr. Edward Mcgee with past medical history of hypertension, GERD, type 2 diabetes, hyperlipidemia is here for follow up and re-evaluation of chronic medical conditions, medication management and review of any available recent lab a  Type 2 diabetes.  Currently taking Jardiance 10 mg daily, glipizide 5 mg daily.  Has been checking blood sugar at home reports readings of 100-125.  Patient denies hypoglycemic episodes.   Patient stated that he has  takien 1 dose of his shingles vaccine.  Patient encouraged to get his second dose of shingles vaccine at his pharmacy verbalized understanding.     ROS Denies recent fever or chills. Denies sinus pressure, nasal congestion, ear pain or sore throat. Denies chest congestion, productive cough or wheezing. Denies chest pains, palpitations and leg swelling Denies abdominal pain, nausea, vomiting,diarrhea or constipation.   Denies dysuria, frequency, hesitancy or incontinence. Denies joint pain, swelling and limitation in mobility. Denies headaches, seizures, numbness, or tingling. Denies depression, anxiety or insomnia.    PE  BP 121/80 (BP Location: Left Arm, Patient Position: Sitting, Cuff Size: Normal)   Pulse 68   Ht 5\' 4"  (1.626 m)   Wt 152 lb (68.9 kg)   SpO2 93%   BMI 26.09 kg/m   Patient alert and oriented and in no cardiopulmonary distress.  Chest: Clear to auscultation bilaterally.  CVS: S1, S2 no murmurs, no S3.Regular rate.  ABD: Soft non tender.   Ext: No edema  MS: Adequate ROM spine, shoulders, hips and knees.  Psych: Good eye contact, normal affect. Memory intact not anxious or depressed appearing.  CNS: CN 2-12 intact, power,  normal throughout.no focal deficits noted.   Assessment & Plan  Hypertension BP Readings from Last 3 Encounters:  11/14/21 121/80  08/29/21 120/70  08/07/21 130/77  Chronic condition well-controlled on Jardiance 10 mg daily,  lisinopril 5 mg daily Continue current medications DASH diet advised engage in regular exercises at least for 50 minutes weekly Follow-up in 6 months  Controlled diabetes mellitus type 2 with complications (HCC) Lab Results  Component Value Date   HGBA1C 6.9 (H) 11/13/2021  A1c much improved from 8.5 Currently on Jardiance 10 mg daily, glipizide 5 mg daily Continue current medications Avoid sugar sweets soda Denies hypoglycemic episodes Follow-up in 6 months  Hyperlipidemia Lab Results  Component Value Date   CHOL 135 08/25/2021   HDL 46 08/25/2021   LDLCALC 62 08/25/2021   LDLDIRECT 97.0 11/23/2020   TRIG 157 (H) 08/25/2021   CHOLHDL 2.9 08/25/2021  Condition well-controlled on atorvastatin 80 mg daily Continue current medication Avoid fried fatty foods

## 2021-11-14 NOTE — Assessment & Plan Note (Signed)
Lab Results  Component Value Date   HGBA1C 6.9 (H) 11/13/2021  A1c much improved from 8.5 Currently on Jardiance 10 mg daily, glipizide 5 mg daily Continue current medications Avoid sugar sweets soda Denies hypoglycemic episodes Follow-up in 6 months

## 2021-11-14 NOTE — Assessment & Plan Note (Signed)
Lab Results  Component Value Date   CHOL 135 08/25/2021   HDL 46 08/25/2021   LDLCALC 62 08/25/2021   LDLDIRECT 97.0 11/23/2020   TRIG 157 (H) 08/25/2021   CHOLHDL 2.9 08/25/2021  Condition well-controlled on atorvastatin 80 mg daily Continue current medication Avoid fried fatty foods

## 2021-11-14 NOTE — Patient Instructions (Addendum)
  Please get your shingles vaccine at your pharmacy    Goal for fasting blood sugar ranges from 80 to 120 and 2 hours after any meal or at bedtime should be between 130 to 170.     It is important that you exercise regularly at least 30 minutes 5 times a week.  Think about what you will eat, plan ahead. Choose " clean, green, fresh or frozen" over canned, processed or packaged foods which are more sugary, salty and fatty. 70 to 75% of food eaten should be vegetables and fruit. Three meals at set times with snacks allowed between meals, but they must be fruit or vegetables. Aim to eat over a 12 hour period , example 7 am to 7 pm, and STOP after  your last meal of the day. Drink water,generally about 64 ounces per day, no other drink is as healthy. Fruit juice is best enjoyed in a healthy way, by EATING the fruit.  Thanks for choosing [facility], we consider it a privelige to serve you.    

## 2021-12-20 ENCOUNTER — Other Ambulatory Visit: Payer: Self-pay | Admitting: Nurse Practitioner

## 2022-01-03 ENCOUNTER — Other Ambulatory Visit: Payer: Self-pay

## 2022-01-03 ENCOUNTER — Telehealth: Payer: Self-pay | Admitting: Nurse Practitioner

## 2022-01-03 DIAGNOSIS — E1141 Type 2 diabetes mellitus with diabetic mononeuropathy: Secondary | ICD-10-CM

## 2022-01-03 MED ORDER — ONETOUCH ULTRA VI STRP: ORAL_STRIP | 3 refills | Status: DC

## 2022-01-03 NOTE — Telephone Encounter (Signed)
Pt wife called stating he is completely out of his test strips. Can you please refill? Pt wife states rx is for testing 1 time per day but he tests 2x per day??   glucose blood (ONETOUCH ULTRA) test strip    Care Well?

## 2022-01-03 NOTE — Telephone Encounter (Signed)
Refill sent.

## 2022-01-04 ENCOUNTER — Other Ambulatory Visit: Payer: Self-pay

## 2022-01-04 DIAGNOSIS — E1141 Type 2 diabetes mellitus with diabetic mononeuropathy: Secondary | ICD-10-CM

## 2022-01-04 MED ORDER — ONETOUCH ULTRA VI STRP: ORAL_STRIP | 3 refills | Status: AC

## 2022-03-13 ENCOUNTER — Other Ambulatory Visit: Payer: Self-pay

## 2022-03-13 DIAGNOSIS — E118 Type 2 diabetes mellitus with unspecified complications: Secondary | ICD-10-CM

## 2022-03-13 MED ORDER — TRUEPLUS LANCETS 33G MISC: 5 refills | Status: DC

## 2022-03-13 MED ORDER — DROPSAFE ALCOHOL PREP 70 % PADS: MEDICATED_PAD | 5 refills | Status: DC

## 2022-03-13 MED ORDER — GNP TRUE METRIX GLUCOSE STRIPS VI STRP: ORAL_STRIP | 12 refills | Status: AC

## 2022-04-13 ENCOUNTER — Telehealth: Payer: Self-pay

## 2022-04-13 NOTE — Telephone Encounter (Signed)
[employer] patient assistance program form  Call patient [phone] when ready  Copied Noted sleeved

## 2022-04-19 ENCOUNTER — Telehealth: Payer: Self-pay | Admitting: Family Medicine

## 2022-04-19 NOTE — Telephone Encounter (Signed)
Patient came by the office and needs refill on his medicines but needs the patient asst. Program filled out.  Edward Mcgee has these forms as of 11.03.2023. Please contact patient Friday morning 04/20/2022 to discuss.  [phone].

## 2022-04-20 NOTE — Telephone Encounter (Signed)
Pt scheduled on 04/25/22 to have forms completed.

## 2022-04-23 DIAGNOSIS — I10 Essential (primary) hypertension: Secondary | ICD-10-CM | POA: Diagnosis not present

## 2022-04-23 DIAGNOSIS — E782 Mixed hyperlipidemia: Secondary | ICD-10-CM | POA: Diagnosis not present

## 2022-04-23 DIAGNOSIS — E118 Type 2 diabetes mellitus with unspecified complications: Secondary | ICD-10-CM | POA: Diagnosis not present

## 2022-04-24 LAB — CMP14+EGFR
ALT: 26 IU/L (ref 0–44)
AST: 28 IU/L (ref 0–40)
Albumin/Globulin Ratio: 2.1 (ref 1.2–2.2)
Albumin: 4.6 g/dL (ref 3.9–4.9)
Alkaline Phosphatase: 102 IU/L (ref 44–121)
BUN/Creatinine Ratio: 21 (ref 10–24)
BUN: 18 mg/dL (ref 8–27)
Bilirubin Total: 0.5 mg/dL (ref 0.0–1.2)
CO2: 22 mmol/L (ref 20–29)
Calcium: 9.6 mg/dL (ref 8.6–10.2)
Chloride: 103 mmol/L (ref 96–106)
Creatinine, Ser: 0.84 mg/dL (ref 0.76–1.27)
Globulin, Total: 2.2 g/dL (ref 1.5–4.5)
Glucose: 109 mg/dL — ABNORMAL HIGH (ref 70–99)
Potassium: 4.4 mmol/L (ref 3.5–5.2)
Sodium: 140 mmol/L (ref 134–144)
Total Protein: 6.8 g/dL (ref 6.0–8.5)
eGFR: 96 mL/min/{1.73_m2} (ref >59)

## 2022-04-24 LAB — HEMOGLOBIN A1C
Est. average glucose Bld gHb Est-mCnc: 154 mg/dL
Hgb A1c MFr Bld: 7 % — ABNORMAL HIGH (ref 4.8–5.6)

## 2022-04-24 LAB — LIPID PANEL
Chol/HDL Ratio: 2.4 ratio (ref 0.0–5.0)
Cholesterol, Total: 139 mg/dL (ref 100–199)
HDL: 58 mg/dL (ref >39)
LDL Chol Calc (NIH): 66 mg/dL (ref 0–99)
Triglycerides: 79 mg/dL (ref 0–149)
VLDL Cholesterol Cal: 15 mg/dL (ref 5–40)

## 2022-04-24 NOTE — Progress Notes (Signed)
Stable labs , follow up with PCP as planned

## 2022-04-25 ENCOUNTER — Ambulatory Visit (INDEPENDENT_AMBULATORY_CARE_PROVIDER_SITE_OTHER): Payer: Medicare HMO | Admitting: Family Medicine

## 2022-04-25 ENCOUNTER — Encounter: Payer: Self-pay | Admitting: Family Medicine

## 2022-04-25 VITALS — BP 122/80 | HR 75 | Ht 64.0 in | Wt 152.0 lb

## 2022-04-25 DIAGNOSIS — E118 Type 2 diabetes mellitus with unspecified complications: Secondary | ICD-10-CM

## 2022-04-25 NOTE — Progress Notes (Signed)
Established Patient Office Visit  Subjective:  Patient ID: Edward Mcgee, male    DOB: January 15, 1956  Age: 66 y.o. MRN: [id number]  CC:  Chief Complaint  Patient presents with   Establish Care    Establishing care, previously seeing Kristin Bruins, needs forms completed for medication assistance.     HPI Edward Mcgee is a 66 y.o. male with past medical history of hypertension, GERD, hyperlipidemia presents today with request to complete his patient assistance program form.  Past Medical History:  Diagnosis Date   DM (diabetes mellitus), type 2, uncontrolled 02/10/2013   GERD (gastroesophageal reflux disease)    Hyperlipidemia    Hypertension 06/19/2021    Past Surgical History:  Procedure Laterality Date   NO PAST SURGERIES      Family History  Problem Relation Age of Onset   Hypertension Mother    Breast cancer Sister    Diabetes type II Sister    Hypertension Sister    Breast cancer Sister    Diabetes Mellitus II Sister    Uterine cancer Sister    Diabetes Mellitus II Sister    Diabetes type II Brother    Diabetes Other    Colon cancer Neg Hx    Esophageal cancer Neg Hx    Rectal cancer Neg Hx    Stomach cancer Neg Hx    Liver cancer Neg Hx    Prostate cancer Neg Hx     Social History   Socioeconomic History   Marital status: Married    Spouse name: Not on file   Number of children: 3   Years of education: 11   Highest education level: Not on file  Occupational History   Occupation: Maintenance  Tobacco Use   Smoking status: Never   Smokeless tobacco: Never  Vaping Use   Vaping Use: Never used  Substance and Sexual Activity   Alcohol use: Yes    Comment: socially   Drug use: No   Sexual activity: Not on file  Other Topics Concern   Not on file  Social History Narrative   Lives with his wife, pt is retired.    Social Determinants of Health   Financial Resource Strain: Low Risk  (08/07/2021)   Overall Financial Resource Strain (CARDIA)    Difficulty of  Paying Living Expenses: Not hard at all  Food Insecurity: No Food Insecurity (08/07/2021)   Hunger Vital Sign    Worried About Running Out of Food in the Last Year: Never true    Ran Out of Food in the Last Year: Never true  Transportation Needs: No Transportation Needs (08/07/2021)   PRAPARE - Hydrologist (Medical): No    Lack of Transportation (Non-Medical): No  Physical Activity: Not on file  Stress: Not on file  Social Connections: Moderately Integrated (08/07/2021)   Social Connection and Isolation Panel [NHANES]    Frequency of Communication with Friends and Family: More than three times a week    Frequency of Social Gatherings with Friends and Family: More than three times a week    Attends Religious Services: More than 4 times per year    Active Member of [employer] or Organizations: No    Attends Archivist Meetings: Never    Marital Status: Married  Human resources officer Violence: Not on file    Outpatient Medications Prior to Visit  Medication Sig Dispense Refill   acetaminophen (TYLENOL) 325 MG tablet Take 650 mg by mouth every 6 (six) hours as  needed.     Alcohol Swabs (DROPSAFE ALCOHOL PREP) 70 % PADS USE AS DIRECTED 300 each 5   atorvastatin (LIPITOR) 80 MG tablet Take 1 tablet (80 mg total) by mouth daily. 90 tablet 3   blood glucose meter kit and supplies KIT Dispense based on patient and insurance preference. Use before breakfast and at bedtime. ICD E11.9 1 each 0   glipiZIDE (GLUCOTROL XL) 5 MG 24 hr tablet Take 1 tablet (5 mg total) by mouth daily with breakfast. 90 tablet 3   glucose blood (GNP TRUE METRIX GLUCOSE STRIPS) test strip Use as instructed 100 each 12   glucose blood (ONETOUCH ULTRA) test strip USE 2 test strips,  TO CHECK GLUCOSE BID 180 each 3   lisinopril (ZESTRIL) 5 MG tablet Take 1 tablet (5 mg total) by mouth daily. 90 tablet 3   omeprazole (PRILOSEC) 20 MG capsule Take 1 capsule (20 mg total) by mouth daily. 90 capsule  2   TRUEplus Lancets 33G MISC TEST BLOOD SUGAR AS DIRECTED 300 each 5   vitamin E 1000 UNIT capsule Take 1,000 Units by mouth daily.     Zinc Sulfate (ZINC 15 PO) Take by mouth.     Cholecalciferol (VITAMIN D3 PO) Take by mouth. (Patient not taking: Reported on 08/29/2021)     empagliflozin (JARDIANCE) 10 MG TABS tablet Take 1 tablet (10 mg total) by mouth daily before breakfast. (Patient not taking: Reported on 04/25/2022) 30 tablet 3   Magnesium 200 MG TABS Take 1 tablet by mouth daily. (Patient not taking: Reported on 04/25/2022)     tamsulosin (FLOMAX) 0.4 MG CAPS capsule Take 1 capsule (0.4 mg total) by mouth daily. (Patient not taking: Reported on 08/29/2021) 90 capsule 3   Facility-Administered Medications Prior to Visit  Medication Dose Route Frequency Provider Last Rate Last Admin   0.9 %  sodium chloride infusion  500 mL Intravenous Continuous Milus Banister, MD        Allergies  Allergen Reactions   Other Shortness Of Breath    Raspberry juice. Pt report SOB,itchy and swelling.    Peanut-Containing Drug Products Other (See Comments)    Itching, can breath and hives    ROS Review of Systems  Constitutional:  Negative for fatigue and fever.  Eyes:  Negative for visual disturbance.  Respiratory:  Negative for chest tightness and shortness of breath.   Cardiovascular:  Negative for chest pain and palpitations.  Genitourinary:  Negative for frequency.  Neurological:  Negative for dizziness and headaches.      Objective:    Physical Exam HENT:     Head: Normocephalic.     Right Ear: External ear normal.     Left Ear: External ear normal.  Cardiovascular:     Rate and Rhythm: Normal rate and regular rhythm.     Pulses: Normal pulses.     Heart sounds: Normal heart sounds.  Pulmonary:     Breath sounds: Normal breath sounds.  Skin:    Findings: No lesion.  Neurological:     Mental Status: He is alert.     BP 122/80   Pulse 75   Ht _0  (1.626 m)   Wt 152 lb  0.6 oz (69 kg)   SpO2 99%   BMI 26.10 kg/m  Wt Readings from Last 3 Encounters:  04/25/22 152 lb 0.6 oz (69 kg)  11/14/21 152 lb (68.9 kg)  08/29/21 156 lb (70.8 kg)    Lab Results  Component Value Date  TSH 2.320 07/13/2021   Lab Results  Component Value Date   WBC 8.1 07/18/2021   HGB 14.1 07/18/2021   HCT 41.5 07/18/2021   MCV 80 07/18/2021   PLT 297 07/18/2021   Lab Results  Component Value Date   NA 140 04/23/2022   K 4.4 04/23/2022   CO2 22 04/23/2022   GLUCOSE 109 (H) 04/23/2022   BUN 18 04/23/2022   CREATININE 0.84 04/23/2022   BILITOT 0.5 04/23/2022   ALKPHOS 102 04/23/2022   AST 28 04/23/2022   ALT 26 04/23/2022   PROT 6.8 04/23/2022   ALBUMIN 4.6 04/23/2022   CALCIUM 9.6 04/23/2022   ANIONGAP 10 05/07/2016   EGFR 96 04/23/2022   GFR 89.77 11/23/2020   Lab Results  Component Value Date   CHOL 139 04/23/2022   Lab Results  Component Value Date   HDL 58 04/23/2022   Lab Results  Component Value Date   LDLCALC 66 04/23/2022   Lab Results  Component Value Date   TRIG 79 04/23/2022   Lab Results  Component Value Date   CHOLHDL 2.4 04/23/2022   Lab Results  Component Value Date   HGBA1C 7.0 (H) 04/23/2022      Assessment & Plan:   Problem List Items Addressed This Visit       Endocrine   Controlled diabetes mellitus type 2 with complications [facility]) - Primary    Patient assistant form is completed  for Jardiance 10 mg tablet daily.       No orders of the defined types were placed in this encounter.   Follow-up: Return in about 3 months (around 07/26/2022).    Alvira Monday, FNP

## 2022-04-25 NOTE — Assessment & Plan Note (Signed)
Patient assistant form is completed  for Jardiance 10 mg tablet daily.

## 2022-04-25 NOTE — Patient Instructions (Signed)
I appreciate the opportunity to provide care to you today!    Follow up:  3 months  Your paperwork has been completed and will be faxed    Please continue to a heart-healthy diet and increase your physical activities. Try to exercise for at least three times a week.      It was a pleasure to see you and I look forward to continuing to work together on your health and well-being. Please do not hesitate to call the office if you need care or have questions about your care.   Have a wonderful day and week. With Gratitude, Gilmore Laroche MSN, FNP-BC

## 2022-05-22 ENCOUNTER — Ambulatory Visit: Payer: Medicare HMO | Admitting: Nurse Practitioner

## 2022-05-22 ENCOUNTER — Ambulatory Visit: Payer: Medicare HMO | Admitting: Family Medicine

## 2022-05-24 ENCOUNTER — Other Ambulatory Visit: Payer: Self-pay | Admitting: Nurse Practitioner

## 2022-05-24 DIAGNOSIS — K2100 Gastro-esophageal reflux disease with esophagitis, without bleeding: Secondary | ICD-10-CM

## 2022-07-25 ENCOUNTER — Ambulatory Visit (INDEPENDENT_AMBULATORY_CARE_PROVIDER_SITE_OTHER): Payer: Medicare HMO | Admitting: Family Medicine

## 2022-07-25 ENCOUNTER — Encounter: Payer: Self-pay | Admitting: Family Medicine

## 2022-07-25 VITALS — BP 136/84

## 2022-07-25 DIAGNOSIS — E1141 Type 2 diabetes mellitus with diabetic mononeuropathy: Secondary | ICD-10-CM | POA: Diagnosis not present

## 2022-07-25 DIAGNOSIS — E782 Mixed hyperlipidemia: Secondary | ICD-10-CM | POA: Diagnosis not present

## 2022-07-25 DIAGNOSIS — K219 Gastro-esophageal reflux disease without esophagitis: Secondary | ICD-10-CM | POA: Diagnosis not present

## 2022-07-25 DIAGNOSIS — E559 Vitamin D deficiency, unspecified: Secondary | ICD-10-CM

## 2022-07-25 DIAGNOSIS — I10 Essential (primary) hypertension: Secondary | ICD-10-CM

## 2022-07-25 DIAGNOSIS — E0789 Other specified disorders of thyroid: Secondary | ICD-10-CM | POA: Diagnosis not present

## 2022-07-25 DIAGNOSIS — E118 Type 2 diabetes mellitus with unspecified complications: Secondary | ICD-10-CM | POA: Diagnosis not present

## 2022-07-25 NOTE — Assessment & Plan Note (Signed)
Stable on omeprazole 20 mg daily

## 2022-07-25 NOTE — Assessment & Plan Note (Signed)
Controlled He takes Jardiance 10 mg daily and lisinopril 5 mg daily Denies headaches, dizziness, blurred vision, chest pain, palpitation Encouraged low-sodium diet with increased physical activity BP Readings from Last 3 Encounters:  07/25/22 136/84  04/25/22 122/80  11/14/21 121/80

## 2022-07-25 NOTE — Patient Instructions (Addendum)
I appreciate the opportunity to provide care to you today!    Follow up:  3 months  Labs: please stop by the lab today to get your blood drawn (CBC, CMP, TSH, Lipid profile, HgA1c, Vit D)   Please continue to a heart-healthy diet and increase your physical activities. Try to exercise for 31mns at least five times a week.    Physical activity helps: Lower your blood glucose, improve your heart health, lower your blood pressure and cholesterol, burn calories to help manage her weight, gave you energy, lower stress, and improve his sleep.  The American diabetes Association (ADA) recommends being active for 2-1/2 hours (150 minutes) or more week.  Exercise for 30 minutes, 5 days a week (150 minutes total)     It was a pleasure to see you and I look forward to continuing to work together on your health and well-being. Please do not hesitate to call the office if you need care or have questions about your care.   Have a wonderful day and week. With Gratitude, GAlvira MondayMSN, FNP-BC

## 2022-07-25 NOTE — Progress Notes (Signed)
Established Patient Office Visit  Subjective:  Patient ID: Edward Mcgee, male    DOB: 1956/01/12  Age: 67 y.o. MRN: [id number]  CC:  Chief Complaint  Patient presents with   Follow-up    3 month f/u, pt reports feeling well.     HPI Edward Mcgee is a 67 y.o. male with past medical history of hypertension, GERD, type 2 diabetes, and hyperlipidemia presents for f/u of  chronic medical conditions. For the details of today's visit, please refer to the assessment and plan.     Past Medical History:  Diagnosis Date   DM (diabetes mellitus), type 2, uncontrolled 02/10/2013   GERD (gastroesophageal reflux disease)    Hyperlipidemia    Hypertension 06/19/2021    Past Surgical History:  Procedure Laterality Date   NO PAST SURGERIES      Family History  Problem Relation Age of Onset   Hypertension Mother    Breast cancer Sister    Diabetes type II Sister    Hypertension Sister    Breast cancer Sister    Diabetes Mellitus II Sister    Uterine cancer Sister    Diabetes Mellitus II Sister    Diabetes type II Brother    Diabetes Other    Colon cancer Neg Hx    Esophageal cancer Neg Hx    Rectal cancer Neg Hx    Stomach cancer Neg Hx    Liver cancer Neg Hx    Prostate cancer Neg Hx     Social History   Socioeconomic History   Marital status: Married    Spouse name: Not on file   Number of children: 3   Years of education: 11   Highest education level: Not on file  Occupational History   Occupation: Maintenance  Tobacco Use   Smoking status: Never   Smokeless tobacco: Never  Vaping Use   Vaping Use: Never used  Substance and Sexual Activity   Alcohol use: Yes    Comment: socially   Drug use: No   Sexual activity: Not on file  Other Topics Concern   Not on file  Social History Narrative   Lives with his wife, pt is retired.    Social Determinants of Health   Financial Resource Strain: Low Risk  (08/07/2021)   Overall Financial Resource Strain (CARDIA)     Difficulty of Paying Living Expenses: Not hard at all  Food Insecurity: No Food Insecurity (08/07/2021)   Hunger Vital Sign    Worried About Running Out of Food in the Last Year: Never true    Ran Out of Food in the Last Year: Never true  Transportation Needs: No Transportation Needs (08/07/2021)   PRAPARE - Hydrologist (Medical): No    Lack of Transportation (Non-Medical): No  Physical Activity: Not on file  Stress: Not on file  Social Connections: Moderately Integrated (08/07/2021)   Social Connection and Isolation Panel [NHANES]    Frequency of Communication with Friends and Family: More than three times a week    Frequency of Social Gatherings with Friends and Family: More than three times a week    Attends Religious Services: More than 4 times per year    Active Member of [employer] or Organizations: No    Attends Archivist Meetings: Never    Marital Status: Married  Human resources officer Violence: Not on file    Outpatient Medications Prior to Visit  Medication Sig Dispense Refill   acetaminophen (TYLENOL) 325  MG tablet Take 650 mg by mouth every 6 (six) hours as needed.     Alcohol Swabs (DROPSAFE ALCOHOL PREP) 70 % PADS USE AS DIRECTED 300 each 5   atorvastatin (LIPITOR) 80 MG tablet Take 1 tablet (80 mg total) by mouth daily. 90 tablet 3   blood glucose meter kit and supplies KIT Dispense based on patient and insurance preference. Use before breakfast and at bedtime. ICD E11.9 1 each 0   Cholecalciferol (VITAMIN D3 PO) Take by mouth. (Patient not taking: Reported on 08/29/2021)     empagliflozin (JARDIANCE) 10 MG TABS tablet Take 1 tablet (10 mg total) by mouth daily before breakfast. (Patient not taking: Reported on 04/25/2022) 30 tablet 3   glipiZIDE (GLUCOTROL XL) 5 MG 24 hr tablet Take 1 tablet (5 mg total) by mouth daily with breakfast. 90 tablet 3   glucose blood (GNP TRUE METRIX GLUCOSE STRIPS) test strip Use as instructed 100 each 12    glucose blood (ONETOUCH ULTRA) test strip USE 2 test strips,  TO CHECK GLUCOSE BID 180 each 3   lisinopril (ZESTRIL) 5 MG tablet Take 1 tablet (5 mg total) by mouth daily. 90 tablet 3   Magnesium 200 MG TABS Take 1 tablet by mouth daily. (Patient not taking: Reported on 04/25/2022)     omeprazole (PRILOSEC) 20 MG capsule TAKE 1 CAPSULE EVERY DAY 90 capsule 3   tamsulosin (FLOMAX) 0.4 MG CAPS capsule Take 1 capsule (0.4 mg total) by mouth daily. (Patient not taking: Reported on 08/29/2021) 90 capsule 3   TRUEplus Lancets 33G MISC TEST BLOOD SUGAR AS DIRECTED 300 each 5   vitamin E 1000 UNIT capsule Take 1,000 Units by mouth daily.     Zinc Sulfate (ZINC 15 PO) Take by mouth.     Facility-Administered Medications Prior to Visit  Medication Dose Route Frequency Provider Last Rate Last Admin   0.9 %  sodium chloride infusion  500 mL Intravenous Continuous Milus Banister, MD        Allergies  Allergen Reactions   Other Shortness Of Breath    Raspberry juice. Pt report SOB,itchy and swelling.    Peanut-Containing Drug Products Other (See Comments)    Itching, can breath and hives    ROS Review of Systems  Constitutional:  Negative for fatigue and fever.  Eyes:  Negative for visual disturbance.  Respiratory:  Negative for chest tightness and shortness of breath.   Cardiovascular:  Negative for chest pain and palpitations.  Neurological:  Negative for dizziness and headaches.      Objective:    Physical Exam HENT:     Head: Normocephalic.     Right Ear: External ear normal.     Left Ear: External ear normal.     Nose: No congestion or rhinorrhea.     Mouth/Throat:     Mouth: Mucous membranes are moist.  Cardiovascular:     Rate and Rhythm: Regular rhythm.     Heart sounds: No murmur heard. Pulmonary:     Effort: No respiratory distress.     Breath sounds: Normal breath sounds.  Neurological:     Mental Status: He is alert.     BP 136/84 (BP Location: Left Arm)  Wt  Readings from Last 3 Encounters:  04/25/22 152 lb 0.6 oz (69 kg)  11/14/21 152 lb (68.9 kg)  08/29/21 156 lb (70.8 kg)    Lab Results  Component Value Date   TSH 2.320 07/13/2021   Lab Results  Component  Value Date   WBC 8.1 07/18/2021   HGB 14.1 07/18/2021   HCT 41.5 07/18/2021   MCV 80 07/18/2021   PLT 297 07/18/2021   Lab Results  Component Value Date   NA 140 04/23/2022   K 4.4 04/23/2022   CO2 22 04/23/2022   GLUCOSE 109 (H) 04/23/2022   BUN 18 04/23/2022   CREATININE 0.84 04/23/2022   BILITOT 0.5 04/23/2022   ALKPHOS 102 04/23/2022   AST 28 04/23/2022   ALT 26 04/23/2022   PROT 6.8 04/23/2022   ALBUMIN 4.6 04/23/2022   CALCIUM 9.6 04/23/2022   ANIONGAP 10 05/07/2016   EGFR 96 04/23/2022   GFR 89.77 11/23/2020   Lab Results  Component Value Date   CHOL 139 04/23/2022   Lab Results  Component Value Date   HDL 58 04/23/2022   Lab Results  Component Value Date   LDLCALC 66 04/23/2022   Lab Results  Component Value Date   TRIG 79 04/23/2022   Lab Results  Component Value Date   CHOLHDL 2.4 04/23/2022   Lab Results  Component Value Date   HGBA1C 7.0 (H) 04/23/2022      Assessment & Plan:  Primary hypertension Assessment & Plan: Controlled He takes Jardiance 10 mg daily and lisinopril 5 mg daily Denies headaches, dizziness, blurred vision, chest pain, palpitation Encouraged low-sodium diet with increased physical activity BP Readings from Last 3 Encounters:  07/25/22 136/84  04/25/22 122/80  11/14/21 121/80     Orders: -     CBC with Differential/Platelet  Controlled type 2 diabetes mellitus with complication, without long-term current use of insulin (HCC) Assessment & Plan: Denies polyuria, polyphagia, 5 days. He takes 10 mg daily No complaints was Lab Results  Component Value Date   HGBA1C 7.0 (H) 04/23/2022      Type 2 diabetes mellitus with diabetic mononeuropathy, without long-term current use of insulin (HCC) -      Hemoglobin A1c -     Microalbumin / creatinine urine ratio -     CMP14+EGFR  Gastroesophageal reflux disease without esophagitis Assessment & Plan: Stable on omeprazole 20 mg daily   Mixed hyperlipidemia -     Lipid panel  Vitamin D deficiency -     VITAMIN D 25 Hydroxy (Vit-D Deficiency, Fractures)  Other specified disorders of thyroid -     TSH + free T4    Follow-up: Return in about 3 months (around 10/23/2022).   Alvira Monday, FNP

## 2022-07-25 NOTE — Assessment & Plan Note (Signed)
Denies polyuria, polyphagia, 5 days. He takes 10 mg daily No complaints was Lab Results  Component Value Date   HGBA1C 7.0 (H) 04/23/2022

## 2022-07-27 LAB — LIPID PANEL
Chol/HDL Ratio: 2.4 ratio (ref 0.0–5.0)
Cholesterol, Total: 157 mg/dL (ref 100–199)
HDL: 66 mg/dL (ref >39)
LDL Chol Calc (NIH): 70 mg/dL (ref 0–99)
Triglycerides: 119 mg/dL (ref 0–149)
VLDL Cholesterol Cal: 21 mg/dL (ref 5–40)

## 2022-07-27 LAB — CMP14+EGFR
ALT: 23 IU/L (ref 0–44)
AST: 21 IU/L (ref 0–40)
Albumin/Globulin Ratio: 2.2 (ref 1.2–2.2)
Albumin: 4.8 g/dL (ref 3.9–4.9)
Alkaline Phosphatase: 89 IU/L (ref 44–121)
BUN/Creatinine Ratio: 21 (ref 10–24)
BUN: 18 mg/dL (ref 8–27)
Bilirubin Total: 0.7 mg/dL (ref 0.0–1.2)
CO2: 24 mmol/L (ref 20–29)
Calcium: 9.8 mg/dL (ref 8.6–10.2)
Chloride: 99 mmol/L (ref 96–106)
Creatinine, Ser: 0.86 mg/dL (ref 0.76–1.27)
Globulin, Total: 2.2 g/dL (ref 1.5–4.5)
Glucose: 116 mg/dL — ABNORMAL HIGH (ref 70–99)
Potassium: 4.3 mmol/L (ref 3.5–5.2)
Sodium: 140 mmol/L (ref 134–144)
Total Protein: 7 g/dL (ref 6.0–8.5)
eGFR: 95 mL/min/{1.73_m2} (ref >59)

## 2022-07-27 LAB — HEMOGLOBIN A1C
Est. average glucose Bld gHb Est-mCnc: 154 mg/dL
Hgb A1c MFr Bld: 7 % — ABNORMAL HIGH (ref 4.8–5.6)

## 2022-07-27 LAB — CBC WITH DIFFERENTIAL/PLATELET
Basophils Absolute: 0 10*3/uL (ref 0.0–0.2)
Basos: 1 %
EOS (ABSOLUTE): 0.3 10*3/uL (ref 0.0–0.4)
Eos: 4 %
Hematocrit: 44.5 % (ref 37.5–51.0)
Hemoglobin: 15.2 g/dL (ref 13.0–17.7)
Immature Grans (Abs): 0 10*3/uL (ref 0.0–0.1)
Immature Granulocytes: 0 %
Lymphocytes Absolute: 2 10*3/uL (ref 0.7–3.1)
Lymphs: 32 %
MCH: 27.3 pg (ref 26.6–33.0)
MCHC: 34.2 g/dL (ref 31.5–35.7)
MCV: 80 fL (ref 79–97)
Monocytes Absolute: 0.5 10*3/uL (ref 0.1–0.9)
Monocytes: 9 %
Neutrophils Absolute: 3.3 10*3/uL (ref 1.4–7.0)
Neutrophils: 54 %
Platelets: 301 10*3/uL (ref 150–450)
RBC: 5.57 x10E6/uL (ref 4.14–5.80)
RDW: 12.3 % (ref 11.6–15.4)
WBC: 6.1 10*3/uL (ref 3.4–10.8)

## 2022-07-27 LAB — TSH+FREE T4
Free T4: 1.66 ng/dL (ref 0.82–1.77)
TSH: 1.75 u[IU]/mL (ref 0.450–4.500)

## 2022-07-27 LAB — MICROALBUMIN / CREATININE URINE RATIO
Creatinine, Urine: 116 mg/dL
Microalb/Creat Ratio: 4 mg/g creat (ref 0–29)
Microalbumin, Urine: 4.1 ug/mL

## 2022-07-27 LAB — VITAMIN D 25 HYDROXY (VIT D DEFICIENCY, FRACTURES): Vit D, 25-Hydroxy: 34.6 ng/mL (ref 30.0–100.0)

## 2022-08-01 ENCOUNTER — Other Ambulatory Visit: Payer: Self-pay | Admitting: Family Medicine

## 2022-08-01 ENCOUNTER — Other Ambulatory Visit: Payer: Self-pay

## 2022-08-01 DIAGNOSIS — E118 Type 2 diabetes mellitus with unspecified complications: Secondary | ICD-10-CM

## 2022-08-01 MED ORDER — EMPAGLIFLOZIN 25 MG PO TABS: 25.0000 mg | ORAL_TABLET | Freq: Every day | ORAL | 2 refills | Status: DC

## 2022-08-01 NOTE — Progress Notes (Signed)
Please inform the patient that increase his Jardiance to 25 mg daily to take for his type 2 diabetes. I recommend avoiding simple carbohydrates, including cakes, sweet desserts, ice cream, soda (diet or regular), sweet tea, candies, chips, cookies, store-bought juices, alcohol in excess of 1-2 drinks a day, lemonade, artificial sweeteners, donuts, coffee creamers, and sugar-free products.  I recommend avoiding greasy, fatty foods with increased physical activity.  All other labs are stable

## 2022-08-07 ENCOUNTER — Encounter: Payer: Medicare HMO | Admitting: Family Medicine

## 2022-08-07 ENCOUNTER — Ambulatory Visit: Payer: Medicare HMO

## 2022-08-08 ENCOUNTER — Ambulatory Visit (INDEPENDENT_AMBULATORY_CARE_PROVIDER_SITE_OTHER): Payer: Medicare HMO | Admitting: Family Medicine

## 2022-08-08 ENCOUNTER — Encounter: Payer: Self-pay | Admitting: Family Medicine

## 2022-08-08 VITALS — BP 149/81 | HR 81 | Ht 64.0 in | Wt 150.0 lb

## 2022-08-08 DIAGNOSIS — Z0000 Encounter for general adult medical examination without abnormal findings: Secondary | ICD-10-CM | POA: Diagnosis not present

## 2022-08-08 NOTE — Patient Instructions (Signed)
  Edward Mcgee , Thank you for taking time to come for your Medicare Wellness Visit. I appreciate your ongoing commitment to your health goals. Please review the following plan we discussed and let me know if I can assist you in the future.   These are the goals we discussed:  Goals      Medication Management     Patient Goals/Self-Care Activities Patient will:  Take medications as prescribed Check blood sugar twice a day at the following times: fasting (at least 8 hours since last food consumption) and bedtime, document, and provide at future appointments Check blood pressure at least once daily, document, and provide at future appointments Collaborate with provider on medication access solutions Target a minimum of 150 minutes of moderate intensity exercise weekly Engage in dietary modifications by fewer sweetened foods & beverages         This is a list of the screening recommended for you and due dates:  Health Maintenance  Topic Date Due   Complete foot exam   07/18/2022   COVID-19 Vaccine (7 - 2023-24 season) 08/24/2022*   Eye exam for diabetics  10/25/2022   Hemoglobin A1C  01/23/2023   Yearly kidney function blood test for diabetes  07/26/2023   Yearly kidney health urinalysis for diabetes  07/26/2023   Medicare Annual Wellness Visit  08/09/2023   Colon Cancer Screening  06/25/2026   DTaP/Tdap/Td vaccine (3 - Td or Tdap) 07/20/2030   Pneumonia Vaccine  Completed   Flu Shot  Completed   Hepatitis C Screening: USPSTF Recommendation to screen - Ages 53-79 yo.  Completed   Zoster (Shingles) Vaccine  Completed   HPV Vaccine  Aged Out  *Topic was postponed. The date shown is not the original due date.

## 2022-08-08 NOTE — Progress Notes (Signed)
Subjective:   Edward Mcgee is a 67 y.o. male who presents for Medicare Annual/Subsequent preventive examination.  Review of Systems    Patient denies pain, fever, chills, chest pain, palpations ,shortness of breath, blurred vision,cough, abdominal pain, nausea, vomiting, headache, dizziness. Patient is not feeling nervous or anxious . Cardiac Risk Factors include: advanced age (>25mn, >>16women)     Objective:    Today's Vitals   08/08/22 0836  BP: (!) 149/81  Pulse: 81  SpO2: 98%  Weight: 150 lb (68 kg)  Height: '5\' 4"'$  (1.626 m)   Body mass index is 25.75 kg/m.     08/08/2022    8:47 AM 08/07/2021    9:03 AM 04/26/2020    3:51 PM 12/29/2019    2:14 AM 02/11/2019   12:06 PM 03/13/2017    4:52 PM 06/25/2016    3:04 PM  Advanced Directives  Does Patient Have a Medical Advance Directive? No;Yes No No No No No No  Type of Advance Directive [place]       Does patient want to make changes to medical advance directive? No - Patient declined        Copy of [place] Chart? Yes - validated most recent copy scanned in chart (See row information)        Would patient like information on creating a medical advance directive? No - Patient declined Yes (ED - Information included in AVS) No - Patient declined No - Patient declined No - Patient declined No - Patient declined     Current Medications (verified) Outpatient Encounter Medications as of 08/08/2022  Medication Sig   acetaminophen (TYLENOL) 325 MG tablet Take 650 mg by mouth every 6 (six) hours as needed.   Alcohol Swabs (DROPSAFE ALCOHOL PREP) 70 % PADS USE AS DIRECTED   atorvastatin (LIPITOR) 80 MG tablet Take 1 tablet (80 mg total) by mouth daily.   blood glucose meter kit and supplies KIT Dispense based on patient and insurance preference. Use before breakfast and at bedtime. ICD E11.9   Cholecalciferol (VITAMIN D3 PO) Take by mouth.   empagliflozin (JARDIANCE) 25 MG TABS tablet  Take 1 tablet (25 mg total) by mouth daily before breakfast.   glipiZIDE (GLUCOTROL XL) 5 MG 24 hr tablet Take 1 tablet (5 mg total) by mouth daily with breakfast.   glucose blood (GNP TRUE METRIX GLUCOSE STRIPS) test strip Use as instructed   glucose blood (ONETOUCH ULTRA) test strip USE 2 test strips,  TO CHECK GLUCOSE BID   lisinopril (ZESTRIL) 5 MG tablet Take 1 tablet (5 mg total) by mouth daily.   Magnesium 200 MG TABS Take 1 tablet by mouth daily.   omeprazole (PRILOSEC) 20 MG capsule TAKE 1 CAPSULE EVERY DAY   tamsulosin (FLOMAX) 0.4 MG CAPS capsule Take 1 capsule (0.4 mg total) by mouth daily.   TRUEplus Lancets 33G MISC TEST BLOOD SUGAR AS DIRECTED   vitamin E 1000 UNIT capsule Take 1,000 Units by mouth daily.   Zinc Sulfate (ZINC 15 PO) Take by mouth.   Facility-Administered Encounter Medications as of 08/08/2022  Medication   0.9 %  sodium chloride infusion    Allergies (verified) Other and Peanut-containing drug products   History: Past Medical History:  Diagnosis Date   DM (diabetes mellitus), type 2, uncontrolled 02/10/2013   GERD (gastroesophageal reflux disease)    Hyperlipidemia    Hypertension 06/19/2021   Past Surgical History:  Procedure Laterality Date  NO PAST SURGERIES     Family History  Problem Relation Age of Onset   Hypertension Mother    Breast cancer Sister    Diabetes type II Sister    Hypertension Sister    Breast cancer Sister    Diabetes Mellitus II Sister    Uterine cancer Sister    Diabetes Mellitus II Sister    Diabetes type II Brother    Diabetes Other    Colon cancer Neg Hx    Esophageal cancer Neg Hx    Rectal cancer Neg Hx    Stomach cancer Neg Hx    Liver cancer Neg Hx    Prostate cancer Neg Hx    Social History   Socioeconomic History   Marital status: Married    Spouse name: Not on file   Number of children: 3   Years of education: 11   Highest education level: Not on file  Occupational History   Occupation:  Maintenance  Tobacco Use   Smoking status: Never   Smokeless tobacco: Never  Vaping Use   Vaping Use: Never used  Substance and Sexual Activity   Alcohol use: Yes    Comment: socially   Drug use: No   Sexual activity: Not on file  Other Topics Concern   Not on file  Social History Narrative   Lives with his wife, pt is retired.    Social Determinants of Health   Financial Resource Strain: Low Risk  (08/08/2022)   Overall Financial Resource Strain (CARDIA)    Difficulty of Paying Living Expenses: Not hard at all  Food Insecurity: No Food Insecurity (08/08/2022)   Hunger Vital Sign    Worried About Running Out of Food in the Last Year: Never true    Ran Out of Food in the Last Year: Never true  Transportation Needs: No Transportation Needs (08/08/2022)   PRAPARE - Hydrologist (Medical): No    Lack of Transportation (Non-Medical): No  Physical Activity: Insufficiently Active (08/08/2022)   Exercise Vital Sign    Days of Exercise per Week: 1 day    Minutes of Exercise per Session: 60 min  Stress: No Stress Concern Present (08/08/2022)   [place]    Feeling of Stress : Not at all  Social Connections: [place] (08/08/2022)   Social Connection and Isolation Panel [NHANES]    Frequency of Communication with Friends and Family: More than three times a week    Frequency of Social Gatherings with Friends and Family: More than three times a week    Attends Religious Services: More than 4 times per year    Active Member of [employer] or Organizations: Yes    Attends Music therapist: More than 4 times per year    Marital Status: Married    Tobacco Counseling Counseling given: Not Answered   Clinical Intake:  Pre-visit preparation completed: No  Pain : No/denies pain     BMI - recorded: 25.75 Nutritional Status: BMI 25 -29 Overweight Diabetes: Yes CBG done?:  No Did pt. bring in CBG monitor from home?: No  How often do you need to have someone help you when you read instructions, pamphlets, or other written materials from your doctor or pharmacy?: 1 - Never  Diabetic? Yes  Interpreter Needed?: Yes      Activities of Daily Living    08/08/2022    8:49 AM  In your present state of  health, do you have any difficulty performing the following activities:  Hearing? 0  Vision? 0  Difficulty concentrating or making decisions? 0  Walking or climbing stairs? 0  Dressing or bathing? 0  Doing errands, shopping? 0  Preparing Food and eating ? N  Using the Toilet? N  In the past six months, have you accidently leaked urine? N  Do you have problems with loss of bowel control? N  Managing your Medications? N  Managing your Finances? N  Housekeeping or managing your Housekeeping? N    Patient Care Team: Alvira Monday, [place] as PCP - General (Family Medicine) Beryle Lathe, [facility] (Inactive) (Pharmacist)  Indicate any recent Medical Services you may have received from other than [facility] providers in the past year (date may be approximate).     Assessment:   This is a routine wellness examination for Ronit.  Hearing/Vision screen No results found.  Dietary issues and exercise activities discussed: Advise lifestyle modifications follow diet low in saturated fat, reduce dietary salt intake, avoid fatty foods, maintain an exercise routine 3 to 5 days a week for a minimum total of 150 minutes.      Goals Addressed   None    Depression Screen    07/25/2022    9:47 AM 04/25/2022    9:46 AM 11/14/2021    9:18 AM 08/29/2021    9:54 AM 08/07/2021    8:47 AM 07/18/2021    8:59 AM 06/19/2021    2:12 PM  PHQ 2/9 Scores  PHQ - 2 Score 0 0 0 0 0 0 0  PHQ- 9 Score 0 0         Fall Risk    08/08/2022    8:49 AM 07/25/2022    9:46 AM 04/25/2022    9:45 AM 11/14/2021    9:18 AM 08/29/2021    9:54 AM  Fall Risk   Falls in the past year? 0 0 0  0 0  Number falls in past yr: 0 0 0 0 0  Injury with Fall? 0 0 0 0 0  Risk for fall due to : No Fall Risks No Fall Risks No Fall Risks No Fall Risks No Fall Risks  Follow up Falls evaluation completed Falls evaluation completed Falls evaluation completed Falls evaluation completed Falls evaluation completed    FALL RISK PREVENTION PERTAINING TO THE HOME:  Any stairs in or around the home? No  If so, are there any without handrails? No  Home free of loose throw rugs in walkways, pet beds, electrical cords, etc? No  Adequate lighting in your home to reduce risk of falls? No   ASSISTIVE DEVICES UTILIZED TO PREVENT FALLS:  Life alert? No  Use of a cane, walker or w/c? No  Grab bars in the bathroom? No  Shower chair or bench in shower? No  Elevated toilet seat or a handicapped toilet? No   TIMED UP AND GO:  Was the test performed? Yes .  Length of time to ambulate 10 feet: 6 sec.   Gait steady and fast without use of assistive device  Cognitive Function:        08/08/2022    8:49 AM 08/07/2021    8:59 AM  6CIT Screen  What Year? 0 points 0 points  What month? 0 points 0 points  What time? 0 points 0 points  Count back from 20 0 points 0 points  Months in reverse 0 points 0 points  Repeat phrase 0 points 0  points  Total Score 0 points 0 points    Immunizations Immunization History  Administered Date(s) Administered   Fluad Quad(high Dose 65+) 04/24/2019, 07/18/2021, 04/02/2022   Influenza Whole 06/11/2006, 04/18/2007   Influenza, High Dose Seasonal PF 07/16/2017, 06/25/2018   Influenza,inj,Quad PF,6+ Mos 05/10/2016   Moderna Covid-19 Vaccine Bivalent Booster 71yr & up 04/20/2022   Moderna SARS-COV2 Booster Vaccination 04/25/2022   PFIZER(Purple Top)SARS-COV-2 Vaccination 08/20/2019, 09/10/2019, 04/28/2020, 01/11/2021, 07/28/2021   Pneumococcal Conjugate-13 12/23/2017   Pneumococcal Polysaccharide-23 04/18/2007, 01/25/2020   Td 05/11/2006   Tdap 07/20/2020   Zoster  Recombinat (Shingrix) 03/12/2022, 06/27/2022   Zoster, Live 12/05/2015    TDAP status: Up to date  Flu Vaccine status: Up to date  Pneumococcal vaccine status: Up to date  Covid-19 vaccine status: Information provided on how to obtain vaccines.   Qualifies for Shingles Vaccine? Yes   Zostavax completed Yes   Shingrix Completed?: Yes  Screening Tests Health Maintenance  Topic Date Due   Medicare Annual Wellness (AWV)  08/07/2022   FOOT EXAM  07/18/2022   COVID-19 Vaccine (7 - 2023-24 season) 08/24/2022 (Originally 06/20/2022)   OPHTHALMOLOGY EXAM  10/25/2022   HEMOGLOBIN A1C  01/23/2023   Diabetic kidney evaluation - eGFR measurement  07/26/2023   Diabetic kidney evaluation - Urine ACR  07/26/2023   COLONOSCOPY (Pts 45-416yrInsurance coverage will need to be confirmed)  06/25/2026   DTaP/Tdap/Td (3 - Td or Tdap) 07/20/2030   Pneumonia Vaccine 6545Years old  Completed   INFLUENZA VACCINE  Completed   Hepatitis C Screening  Completed   Zoster Vaccines- Shingrix  Completed   HPV VACCINES  Aged Out    Health Maintenance  Health Maintenance Due  Topic Date Due   Medicare Annual Wellness (AWV)  08/07/2022   FOOT EXAM  07/18/2022    Colorectal cancer screening: Type of screening: Colonoscopy. Completed  . Repeat every 10 years Next due on 06/2026  Lung Cancer Screening: (Low Dose CT Chest recommended if Age 68-80ears, 30 pack-year currently smoking OR have quit w/in 15years.) does not qualify.   Lung Cancer Screening Referral: N/A  Additional Screening:  Hepatitis C Screening: does not qualify; Completed 2019 NEGATIVE  Vision Screening: Recommended annual ophthalmology exams for early detection of glaucoma and other disorders of the eye. Is the patient up to date with their annual eye exam?  Yes  Who is the provider or what is the name of the office in which the patient attends annual eye exams? Dr. MiMarygrace Droughtf pt is not established with a provider, would they  like to be referred to a provider to establish care? No .   Dental Screening: Recommended annual dental exams for proper oral hygiene  Community Resource Referral / Chronic Care Management: CRR required this visit?  No   CCM required this visit?  No      Plan:     I have personally reviewed and noted the following in the patient's chart:   Medical and social history Use of alcohol, tobacco or illicit drugs  Current medications and supplements including opioid prescriptions. Patient is not currently taking opioid prescriptions. Functional ability and status Nutritional status Physical activity Advanced directives List of other physicians Hospitalizations, surgeries, and ER visits in previous 12 months Vitals Screenings to include cognitive, depression, and falls Referrals and appointments  In addition, I have reviewed and discussed with patient certain preventive protocols, quality metrics, and best practice recommendations. A written personalized care plan for preventive services as  well as general preventive health recommendations were provided to patient.     Shaw, [place]   08/08/2022

## 2022-09-02 ENCOUNTER — Other Ambulatory Visit: Payer: Self-pay | Admitting: Nurse Practitioner

## 2022-09-02 DIAGNOSIS — I10 Essential (primary) hypertension: Secondary | ICD-10-CM

## 2022-10-15 ENCOUNTER — Other Ambulatory Visit: Payer: Self-pay

## 2022-10-15 DIAGNOSIS — E1165 Type 2 diabetes mellitus with hyperglycemia: Secondary | ICD-10-CM

## 2022-10-15 MED ORDER — GLIPIZIDE ER 5 MG PO TB24: 5.0000 mg | ORAL_TABLET | Freq: Every day | ORAL | 3 refills | Status: DC

## 2022-10-17 ENCOUNTER — Telehealth: Payer: Self-pay | Admitting: Family Medicine

## 2022-10-17 ENCOUNTER — Other Ambulatory Visit: Payer: Self-pay

## 2022-10-17 DIAGNOSIS — I10 Essential (primary) hypertension: Secondary | ICD-10-CM

## 2022-10-17 DIAGNOSIS — E782 Mixed hyperlipidemia: Secondary | ICD-10-CM

## 2022-10-17 MED ORDER — ATORVASTATIN CALCIUM 80 MG PO TABS: 80.0000 mg | ORAL_TABLET | Freq: Every day | ORAL | 3 refills | Status: DC

## 2022-10-17 MED ORDER — LISINOPRIL 5 MG PO TABS: 5.0000 mg | ORAL_TABLET | Freq: Every day | ORAL | 3 refills | Status: DC

## 2022-10-17 NOTE — Telephone Encounter (Signed)
[facility] pharmacy calling for prescription request for pt.

## 2022-10-17 NOTE — Telephone Encounter (Signed)
Spoke to pharmacy requesting refill on lisinopril and atorvastatin, refills sent.

## 2022-10-25 ENCOUNTER — Ambulatory Visit: Payer: Medicare HMO | Admitting: Family Medicine

## 2022-11-13 ENCOUNTER — Ambulatory Visit (INDEPENDENT_AMBULATORY_CARE_PROVIDER_SITE_OTHER): Payer: Medicare HMO | Admitting: Family Medicine

## 2022-11-13 ENCOUNTER — Encounter: Payer: Self-pay | Admitting: Family Medicine

## 2022-11-13 VITALS — BP 133/79 | HR 62 | Ht 64.0 in | Wt 148.0 lb

## 2022-11-13 DIAGNOSIS — E038 Other specified hypothyroidism: Secondary | ICD-10-CM

## 2022-11-13 DIAGNOSIS — Z7984 Long term (current) use of oral hypoglycemic drugs: Secondary | ICD-10-CM | POA: Diagnosis not present

## 2022-11-13 DIAGNOSIS — E118 Type 2 diabetes mellitus with unspecified complications: Secondary | ICD-10-CM

## 2022-11-13 DIAGNOSIS — E782 Mixed hyperlipidemia: Secondary | ICD-10-CM

## 2022-11-13 DIAGNOSIS — I10 Essential (primary) hypertension: Secondary | ICD-10-CM | POA: Diagnosis not present

## 2022-11-13 DIAGNOSIS — E559 Vitamin D deficiency, unspecified: Secondary | ICD-10-CM | POA: Diagnosis not present

## 2022-11-13 DIAGNOSIS — K219 Gastro-esophageal reflux disease without esophagitis: Secondary | ICD-10-CM | POA: Diagnosis not present

## 2022-11-13 DIAGNOSIS — E1165 Type 2 diabetes mellitus with hyperglycemia: Secondary | ICD-10-CM

## 2022-11-13 NOTE — Progress Notes (Signed)
Established Patient Office Visit  Subjective:  Patient ID: Edward Mcgee, male    DOB: [birth year]  Age: 67 y.o. MRN: [id number]  CC:  Chief Complaint  Patient presents with   Chronic Care Management    3 month f/u,     HPI Edward Mcgee is a 67 y.o. male with past medical history of type 2 diabetes, hyperlipidemia, BPH, GERD, and hypertension presents for f/u of  chronic medical conditions. For the details of today's visit, please refer to the assessment and plan.     Past Medical History:  Diagnosis Date   DM (diabetes mellitus), type 2, uncontrolled 02/10/2013   GERD (gastroesophageal reflux disease)    Hyperlipidemia    Hypertension 06/19/2021    Past Surgical History:  Procedure Laterality Date   NO PAST SURGERIES      Family History  Problem Relation Age of Onset   Hypertension Mother    Breast cancer Sister    Diabetes type II Sister    Hypertension Sister    Breast cancer Sister    Diabetes Mellitus II Sister    Uterine cancer Sister    Diabetes Mellitus II Sister    Diabetes type II Brother    Diabetes Other    Colon cancer Neg Hx    Esophageal cancer Neg Hx    Rectal cancer Neg Hx    Stomach cancer Neg Hx    Liver cancer Neg Hx    Prostate cancer Neg Hx     Social History   Socioeconomic History   Marital status: Married    Spouse name: Not on file   Number of children: 3   Years of education: 11   Highest education level: Not on file  Occupational History   Occupation: Maintenance  Tobacco Use   Smoking status: Never   Smokeless tobacco: Never  Vaping Use   Vaping Use: Never used  Substance and Sexual Activity   Alcohol use: Yes    Comment: socially   Drug use: No   Sexual activity: Not on file  Other Topics Concern   Not on file  Social History Narrative   Lives with his wife, pt is retired.    Social Determinants of Health   Financial Resource Strain: Low Risk  (08/08/2022)   Overall Financial Resource Strain (CARDIA)     Difficulty of Paying Living Expenses: Not hard at all  Food Insecurity: No Food Insecurity (08/08/2022)   Hunger Vital Sign    Worried About Running Out of Food in the Last Year: Never true    Ran Out of Food in the Last Year: Never true  Transportation Needs: No Transportation Needs (08/08/2022)   PRAPARE - Administrator, Civil Service (Medical): No    Lack of Transportation (Non-Medical): No  Physical Activity: Insufficiently Active (08/08/2022)   Exercise Vital Sign    Days of Exercise per Week: 1 day    Minutes of Exercise per Session: 60 min  Stress: No Stress Concern Present (08/08/2022)   [employer] of Occupational Health - Occupational Stress Questionnaire    Feeling of Stress : Not at all  Social Connections: Socially Integrated (08/08/2022)   Social Connection and Isolation Panel [NHANES]    Frequency of Communication with Friends and Family: More than three times a week    Frequency of Social Gatherings with Friends and Family: More than three times a week    Attends Religious Services: More than 4 times per year  Active Member of Clubs or Organizations: Yes    Attends Banker Meetings: More than 4 times per year    Marital Status: Married  Catering manager Violence: Not At Risk (08/08/2022)   Humiliation, Afraid, Rape, and Kick questionnaire    Fear of Current or Ex-Partner: No    Emotionally Abused: No    Physically Abused: No    Sexually Abused: No    Outpatient Medications Prior to Visit  Medication Sig Dispense Refill   acetaminophen (TYLENOL) 325 MG tablet Take 650 mg by mouth every 6 (six) hours as needed.     Alcohol Swabs (DROPSAFE ALCOHOL PREP) 70 % PADS USE AS DIRECTED 300 each 5   atorvastatin (LIPITOR) 80 MG tablet Take 1 tablet (80 mg total) by mouth daily. 90 tablet 3   blood glucose meter kit and supplies KIT Dispense based on patient and insurance preference. Use before breakfast and at bedtime. ICD E11.9 1 each 0    Cholecalciferol (VITAMIN D3 PO) Take by mouth.     empagliflozin (JARDIANCE) 25 MG TABS tablet Take 1 tablet (25 mg total) by mouth daily before breakfast. 30 tablet 2   glipiZIDE (GLUCOTROL XL) 5 MG 24 hr tablet Take 1 tablet (5 mg total) by mouth daily with breakfast. 90 tablet 3   glucose blood (GNP TRUE METRIX GLUCOSE STRIPS) test strip Use as instructed 100 each 12   glucose blood (ONETOUCH ULTRA) test strip USE 2 test strips,  TO CHECK GLUCOSE BID 180 each 3   lisinopril (ZESTRIL) 5 MG tablet Take 1 tablet (5 mg total) by mouth daily. 90 tablet 3   Magnesium 200 MG TABS Take 1 tablet by mouth daily.     omeprazole (PRILOSEC) 20 MG capsule TAKE 1 CAPSULE EVERY DAY 90 capsule 3   tamsulosin (FLOMAX) 0.4 MG CAPS capsule Take 1 capsule (0.4 mg total) by mouth daily. 90 capsule 3   TRUEplus Lancets 33G MISC TEST BLOOD SUGAR AS DIRECTED 300 each 5   vitamin E 1000 UNIT capsule Take 1,000 Units by mouth daily.     Zinc Sulfate (ZINC 15 PO) Take by mouth.     Facility-Administered Medications Prior to Visit  Medication Dose Route Frequency Provider Last Rate Last Admin   0.9 %  sodium chloride infusion  500 mL Intravenous Continuous Rachael Fee, MD        Allergies  Allergen Reactions   Other Shortness Of Breath    Raspberry juice. Pt report SOB,itchy and swelling.    Peanut-Containing Drug Products Other (See Comments)    Itching, can breath and hives    ROS Review of Systems  Constitutional:  Negative for fatigue and fever.  Eyes:  Negative for visual disturbance.  Respiratory:  Negative for chest tightness and shortness of breath.   Cardiovascular:  Negative for chest pain and palpitations.  Neurological:  Negative for dizziness and headaches.      Objective:    Physical Exam HENT:     Head: Normocephalic.     Right Ear: External ear normal.     Left Ear: External ear normal.     Nose: No congestion or rhinorrhea.     Mouth/Throat:     Mouth: Mucous membranes are  moist.  Cardiovascular:     Rate and Rhythm: Regular rhythm.     Heart sounds: No murmur heard. Pulmonary:     Effort: No respiratory distress.     Breath sounds: Normal breath sounds.  Neurological:  Mental Status: He is alert.     BP 133/79   Pulse 62   Ht 5\' 4"  (1.626 m)   Wt 148 lb (67.1 kg)   SpO2 96%   BMI 25.40 kg/m  Wt Readings from Last 3 Encounters:  11/13/22 148 lb (67.1 kg)  08/08/22 150 lb (68 kg)  04/25/22 152 lb 0.6 oz (69 kg)    Lab Results  Component Value Date   TSH 1.750 07/25/2022   Lab Results  Component Value Date   WBC 6.1 07/25/2022   HGB 15.2 07/25/2022   HCT 44.5 07/25/2022   MCV 80 07/25/2022   PLT 301 07/25/2022   Lab Results  Component Value Date   NA 140 07/25/2022   K 4.3 07/25/2022   CO2 24 07/25/2022   GLUCOSE 116 (H) 07/25/2022   BUN 18 07/25/2022   CREATININE 0.86 07/25/2022   BILITOT 0.7 07/25/2022   ALKPHOS 89 07/25/2022   AST 21 07/25/2022   ALT 23 07/25/2022   PROT 7.0 07/25/2022   ALBUMIN 4.8 07/25/2022   CALCIUM 9.8 07/25/2022   ANIONGAP 10 05/07/2016   EGFR 95 07/25/2022   GFR 89.77 11/23/2020   Lab Results  Component Value Date   CHOL 157 07/25/2022   Lab Results  Component Value Date   HDL 66 07/25/2022   Lab Results  Component Value Date   LDLCALC 70 07/25/2022   Lab Results  Component Value Date   TRIG 119 07/25/2022   Lab Results  Component Value Date   CHOLHDL 2.4 07/25/2022   Lab Results  Component Value Date   HGBA1C 7.0 (H) 07/25/2022      Assessment & Plan:  Primary hypertension Assessment & Plan: Controlled Asymptomatic in the clinic Reports taking lisinopril 5 mg daily Low-sodium diet and increase physical activity encouraged BP Readings from Last 3 Encounters:  11/13/22 133/79  08/08/22 (!) 149/81  07/25/22 136/84      Controlled type 2 diabetes mellitus with complication, without long-term current use of insulin (HCC) Assessment & Plan: He currently takes  glipizide 5 mg daily and Jardiance 25 mg daily Denies polyuria, polyphagia, polydipsia Pending hemoglobin A1c today Encouraged to avoid high sugar foods and beverages Lab Results  Component Value Date   HGBA1C 7.0 (H) 07/25/2022      Type 2 diabetes mellitus with hyperglycemia, without long-term current use of insulin (HCC) -     HM Diabetes Foot Exam -     Hemoglobin A1c  Gastroesophageal reflux disease without esophagitis Assessment & Plan: Reports compliance on omeprazole 20 mg daily No other concerns voiced    Mixed hyperlipidemia Assessment & Plan: He takes Lipitor 80 mg daily Reports compliance with treatment regimen Pending lipid panel Lab Results  Component Value Date   CHOL 157 07/25/2022   HDL 66 07/25/2022   LDLCALC 70 07/25/2022   LDLDIRECT 97.0 11/23/2020   TRIG 119 07/25/2022   CHOLHDL 2.4 07/25/2022     Orders: -     Lipid panel -     CMP14+EGFR -     CBC with Differential/Platelet  Vitamin D deficiency -     VITAMIN D 25 Hydroxy (Vit-D Deficiency, Fractures)  Other specified hypothyroidism -     TSH + free T4    Follow-up: Return in about 4 months (around 03/15/2023).   Gilmore Laroche, FNP

## 2022-11-13 NOTE — Assessment & Plan Note (Signed)
He takes Lipitor 80 mg daily Reports compliance with treatment regimen Pending lipid panel Lab Results  Component Value Date   CHOL 157 07/25/2022   HDL 66 07/25/2022   LDLCALC 70 07/25/2022   LDLDIRECT 97.0 11/23/2020   TRIG 119 07/25/2022   CHOLHDL 2.4 07/25/2022

## 2022-11-13 NOTE — Patient Instructions (Addendum)
  Aprecio la oportunidad de brindarle atencin hoy!    Seguimiento: 4 meses  Laboratorios: pase por el laboratorio hoy para que le extraigan sangre (CBC, CMP, TSH, perfil lipdico, HgA1c, Vit D)  Objetivo de azcar en sangre:  Azcar en sangre en ayunas: 80-130 Antes del almuerzo y la cena: menos de 140 2 horas despus de las comidas: menos de 180    Contine con una dieta saludable para el corazn y aumente su actividad fsica. Intente hacer ejercicio durante 30 minutos al [employer] a la [place].      Fue un placer verte y espero seguir trabajando juntos por tu salud y Health visitor. No dude en llamar al consultorio si necesita atencin o tiene preguntas sobre su atencin.   Que tengas un da y Forensic psychologist. Con gratitud, Gilmore Laroche MSN, FNP-BC

## 2022-11-13 NOTE — Assessment & Plan Note (Signed)
He currently takes glipizide 5 mg daily and Jardiance 25 mg daily Denies polyuria, polyphagia, polydipsia Pending hemoglobin A1c today Encouraged to avoid high sugar foods and beverages Lab Results  Component Value Date   HGBA1C 7.0 (H) 07/25/2022

## 2022-11-13 NOTE — Assessment & Plan Note (Addendum)
Controlled Asymptomatic in the clinic Reports taking lisinopril 5 mg daily Low-sodium diet and increase physical activity encouraged BP Readings from Last 3 Encounters:  11/13/22 133/79  08/08/22 (!) 149/81  07/25/22 136/84

## 2022-11-13 NOTE — Assessment & Plan Note (Signed)
Reports compliance on omeprazole 20 mg daily No other concerns voiced

## 2022-11-14 ENCOUNTER — Other Ambulatory Visit: Payer: Self-pay | Admitting: Family Medicine

## 2022-11-14 DIAGNOSIS — E1141 Type 2 diabetes mellitus with diabetic mononeuropathy: Secondary | ICD-10-CM

## 2022-11-14 LAB — CBC WITH DIFFERENTIAL/PLATELET
Basophils Absolute: 0.1 10*3/uL (ref 0.0–0.2)
Basos: 1 %
EOS (ABSOLUTE): 0.3 10*3/uL (ref 0.0–0.4)
Eos: 5 %
Hematocrit: 47.4 % (ref 37.5–51.0)
Hemoglobin: 15.7 g/dL (ref 13.0–17.7)
Immature Grans (Abs): 0 10*3/uL (ref 0.0–0.1)
Immature Granulocytes: 0 %
Lymphocytes Absolute: 2.3 10*3/uL (ref 0.7–3.1)
Lymphs: 36 %
MCH: 27.5 pg (ref 26.6–33.0)
MCHC: 33.1 g/dL (ref 31.5–35.7)
MCV: 83 fL (ref 79–97)
Monocytes Absolute: 0.5 10*3/uL (ref 0.1–0.9)
Monocytes: 8 %
Neutrophils Absolute: 3.2 10*3/uL (ref 1.4–7.0)
Neutrophils: 50 %
Platelets: 327 10*3/uL (ref 150–450)
RBC: 5.71 x10E6/uL (ref 4.14–5.80)
RDW: 12.4 % (ref 11.6–15.4)
WBC: 6.4 10*3/uL (ref 3.4–10.8)

## 2022-11-14 LAB — CMP14+EGFR
ALT: 24 IU/L (ref 0–44)
AST: 20 IU/L (ref 0–40)
Albumin/Globulin Ratio: 1.9 (ref 1.2–2.2)
Albumin: 4.6 g/dL (ref 3.9–4.9)
Alkaline Phosphatase: 92 IU/L (ref 44–121)
BUN/Creatinine Ratio: 17 (ref 10–24)
BUN: 17 mg/dL (ref 8–27)
Bilirubin Total: 0.5 mg/dL (ref 0.0–1.2)
CO2: 25 mmol/L (ref 20–29)
Calcium: 9.8 mg/dL (ref 8.6–10.2)
Chloride: 99 mmol/L (ref 96–106)
Creatinine, Ser: 0.99 mg/dL (ref 0.76–1.27)
Globulin, Total: 2.4 g/dL (ref 1.5–4.5)
Glucose: 150 mg/dL — ABNORMAL HIGH (ref 70–99)
Potassium: 4.5 mmol/L (ref 3.5–5.2)
Sodium: 137 mmol/L (ref 134–144)
Total Protein: 7 g/dL (ref 6.0–8.5)
eGFR: 83 mL/min/{1.73_m2} (ref >59)

## 2022-11-14 LAB — LIPID PANEL
Chol/HDL Ratio: 2.3 ratio (ref 0.0–5.0)
Cholesterol, Total: 173 mg/dL (ref 100–199)
HDL: 75 mg/dL (ref >39)
LDL Chol Calc (NIH): 82 mg/dL (ref 0–99)
Triglycerides: 87 mg/dL (ref 0–149)
VLDL Cholesterol Cal: 16 mg/dL (ref 5–40)

## 2022-11-14 LAB — VITAMIN D 25 HYDROXY (VIT D DEFICIENCY, FRACTURES): Vit D, 25-Hydroxy: 33.4 ng/mL (ref 30.0–100.0)

## 2022-11-14 LAB — TSH+FREE T4
Free T4: 1.51 ng/dL (ref 0.82–1.77)
TSH: 1.42 u[IU]/mL (ref 0.450–4.500)

## 2022-11-14 LAB — HEMOGLOBIN A1C
Est. average glucose Bld gHb Est-mCnc: 171 mg/dL
Hgb A1c MFr Bld: 7.6 % — ABNORMAL HIGH (ref 4.8–5.6)

## 2022-11-14 MED ORDER — GLIPIZIDE 10 MG PO TABS
10.0000 mg | ORAL_TABLET | Freq: Two times a day (BID) | ORAL | 3 refills | Status: DC
Start: 2022-11-14 — End: 2023-11-21

## 2022-11-14 NOTE — Progress Notes (Signed)
Please inform the patient that his hemoglobin A1c has increased from 7.0 to 7.6.  I have increased his glipizide to 10 mg daily and I recommend that he continue taking Jardiance 25 mg daily.  I highly recommend avoidance of high sugar foods and beverages with increased physical activity (moderate intensity) at least 3-4 times weekly.  All other labs are stable

## 2022-11-23 ENCOUNTER — Encounter (HOSPITAL_COMMUNITY): Payer: Self-pay | Admitting: Emergency Medicine

## 2022-11-23 ENCOUNTER — Other Ambulatory Visit: Payer: Self-pay

## 2022-11-23 ENCOUNTER — Emergency Department (HOSPITAL_COMMUNITY)
Admission: EM | Admit: 2022-11-23 | Discharge: 2022-11-23 | Disposition: A | Discharge status: Home / Self Care | Payer: Medicare HMO | Attending: Emergency Medicine | Admitting: Emergency Medicine

## 2022-11-23 ENCOUNTER — Emergency Department (HOSPITAL_COMMUNITY): Payer: Medicare HMO

## 2022-11-23 DIAGNOSIS — E119 Type 2 diabetes mellitus without complications: Secondary | ICD-10-CM | POA: Insufficient documentation

## 2022-11-23 DIAGNOSIS — I10 Essential (primary) hypertension: Secondary | ICD-10-CM | POA: Insufficient documentation

## 2022-11-23 DIAGNOSIS — W11XXXA Fall on and from ladder, initial encounter: Secondary | ICD-10-CM | POA: Diagnosis not present

## 2022-11-23 DIAGNOSIS — S50311A Abrasion of right elbow, initial encounter: Secondary | ICD-10-CM | POA: Insufficient documentation

## 2022-11-23 DIAGNOSIS — Z91010 Allergy to peanuts: Secondary | ICD-10-CM | POA: Insufficient documentation

## 2022-11-23 DIAGNOSIS — Z7984 Long term (current) use of oral hypoglycemic drugs: Secondary | ICD-10-CM | POA: Diagnosis not present

## 2022-11-23 DIAGNOSIS — M25521 Pain in right elbow: Secondary | ICD-10-CM

## 2022-11-23 DIAGNOSIS — W19XXXA Unspecified fall, initial encounter: Secondary | ICD-10-CM

## 2022-11-23 DIAGNOSIS — S4991XA Unspecified injury of right shoulder and upper arm, initial encounter: Secondary | ICD-10-CM | POA: Diagnosis not present

## 2022-11-23 DIAGNOSIS — Z79899 Other long term (current) drug therapy: Secondary | ICD-10-CM | POA: Diagnosis not present

## 2022-11-23 DIAGNOSIS — S40211A Abrasion of right shoulder, initial encounter: Secondary | ICD-10-CM | POA: Diagnosis not present

## 2022-11-23 MED ORDER — OXYCODONE-ACETAMINOPHEN 5-325 MG PO TABS: 1.0000 | ORAL_TABLET | Freq: Four times a day (QID) | ORAL | 0 refills | Status: AC | PRN

## 2022-11-23 MED ORDER — OXYCODONE-ACETAMINOPHEN 5-325 MG PO TABS
1.0000 | ORAL_TABLET | Freq: Once | ORAL | Status: AC
  Administered 2022-11-23: 1 via ORAL
  Filled 2022-11-23: qty 1

## 2022-11-23 NOTE — ED Provider Notes (Signed)
[place] EMERGENCY DEPARTMENT AT [facility] Provider Note   CSN: [id number] Arrival date & time: 11/23/22  2013     History  Chief Complaint  Patient presents with   Fall   Arm Pain    Edward Mcgee is a 67 y.o. male with a past with history of diabetes, GERD, hypertension, hyperlipidemia presents today for evaluation after a fall.  Patient states he was climbing on a ladder when he tripped and fell 2 steps down.  He denies hitting his head, LOC, nausea or vomiting.  States he has pain in his right elbow.  He has not tried any pain medication prior to arrival.  He denies having any chest pain, shortness of breath or dizziness before he fell.   Fall  Arm Pain      Past Medical History:  Diagnosis Date   DM (diabetes mellitus), type 2, uncontrolled 02/10/2013   GERD (gastroesophageal reflux disease)    Hyperlipidemia    Hypertension 06/19/2021   Past Surgical History:  Procedure Laterality Date   NO PAST SURGERIES       Home Medications Prior to Admission medications   Medication Sig Start Date End Date Taking? Authorizing Provider  oxyCODONE-acetaminophen (PERCOCET/ROXICET) 5-325 MG tablet Take 1 tablet by mouth every 6 (six) hours as needed for up to 3 days for severe pain. 11/23/22 11/26/22 Yes Jeanelle Malling, PA  acetaminophen (TYLENOL) 325 MG tablet Take 650 mg by mouth every 6 (six) hours as needed.    [provider]  Alcohol Swabs (DROPSAFE ALCOHOL PREP) 70 % PADS USE AS DIRECTED 03/13/22   Gilmore Laroche, FNP  atorvastatin (LIPITOR) 80 MG tablet Take 1 tablet (80 mg total) by mouth daily. 10/17/22   Gilmore Laroche, FNP  blood glucose meter kit and supplies KIT Dispense based on patient and insurance preference. Use before breakfast and at bedtime. ICD E11.9 01/21/19   [name], Bonna Gains, NP  Cholecalciferol (VITAMIN D3 PO) Take by mouth.    [provider]  empagliflozin (JARDIANCE) 25 MG TABS tablet Take 1 tablet (25 mg total) by mouth daily  before breakfast. 08/01/22   Gilmore Laroche, FNP  glipiZIDE (GLUCOTROL) 10 MG tablet Take 1 tablet (10 mg total) by mouth 2 (two) times daily before a meal. 11/14/22   Gilmore Laroche, FNP  glucose blood (GNP TRUE METRIX GLUCOSE STRIPS) test strip Use as instructed 03/13/22   Gilmore Laroche, FNP  glucose blood (ONETOUCH ULTRA) test strip USE 2 test strips,  TO CHECK GLUCOSE BID 01/04/22   Paseda, Folashade R, FNP  lisinopril (ZESTRIL) 5 MG tablet Take 1 tablet (5 mg total) by mouth daily. 10/17/22   Gilmore Laroche, FNP  Magnesium 200 MG TABS Take 1 tablet by mouth daily.    [provider]  omeprazole (PRILOSEC) 20 MG capsule TAKE 1 CAPSULE EVERY DAY 05/24/22   Gilmore Laroche, FNP  tamsulosin (FLOMAX) 0.4 MG CAPS capsule Take 1 capsule (0.4 mg total) by mouth daily. 06/27/20   [name], Bonna Gains, NP  TRUEplus Lancets 33G MISC TEST BLOOD SUGAR AS DIRECTED 03/13/22   Gilmore Laroche, FNP  vitamin E 1000 UNIT capsule Take 1,000 Units by mouth daily.    [provider]  Zinc Sulfate (ZINC 15 PO) Take by mouth.    [provider]      Allergies    Other and Peanut-containing drug products    Review of Systems   Review of Systems Negative except as per HPI.  Physical Exam Updated Vital  Signs BP 120/73 (BP Location: Left Arm)   Pulse 75   Temp (!) 97.5 F (36.4 C) (Oral)   Resp 16   Ht 5\' 4"  (1.626 m)   Wt 67 kg   SpO2 95%   BMI 25.35 kg/m  Physical Exam Vitals and nursing note reviewed.  Constitutional:      Appearance: Normal appearance.  HENT:     Head: Normocephalic and atraumatic.     Mouth/Throat:     Mouth: Mucous membranes are moist.  Eyes:     General: No scleral icterus. Cardiovascular:     Rate and Rhythm: Normal rate and regular rhythm.     Pulses: Normal pulses.     Heart sounds: Normal heart sounds.  Pulmonary:     Effort: Pulmonary effort is normal.     Breath sounds: Normal breath sounds.  Abdominal:     General: Abdomen is flat.      Palpations: Abdomen is soft.     Tenderness: There is no abdominal tenderness.  Musculoskeletal:        General: No deformity.     Comments: Tenderness to palpation to right elbow.  Mild skin abrasion near the right antecubital area.  No bruises noted.  Right elbow with limited active range of motion due to pain.    Skin:    General: Skin is warm.     Findings: No rash.  Neurological:     General: No focal deficit present.     Mental Status: He is alert.  Psychiatric:        Mood and Affect: Mood normal.     ED Results / Procedures / Treatments   Labs (all labs ordered are listed, but only abnormal results are displayed) Labs Reviewed - No data to display  EKG EKG Interpretation  Date/Time:  Friday November 23 2022 20:49:40 EDT Ventricular Rate:  72 PR Interval:  235 QRS Duration: 88 QT Interval:  371 QTC Calculation: 406 R Axis:   50 Text Interpretation: Sinus rhythm Prolonged PR interval Confirmed by Alvino Blood ([id number]) on 11/23/2022 9:06:32 PM  Radiology DG Humerus Right  Result Date: 11/23/2022 CLINICAL DATA:  Fall with injury.  Abrasion to the distal humerus. EXAM: RIGHT HUMERUS - 2+ VIEW COMPARISON:  None Available. FINDINGS: There is no evidence of fracture or other focal bone lesions. Soft tissues are unremarkable. IMPRESSION: Negative. Electronically Signed   By: Burman Nieves M.D.   On: 11/23/2022 21:05    Procedures Procedures    Medications Ordered in ED Medications  oxyCODONE-acetaminophen (PERCOCET/ROXICET) 5-325 MG per tablet 1 tablet (1 tablet Oral Given 11/23/22 2111)    ED Course/ Medical Decision Making/ A&P                             Medical Decision Making Risk Prescription drug management.   This patient presents to the ED for R elbow pain, this involves an extensive number of treatment options, and is a complaint that carries with a high risk of complications and morbidity.  The differential diagnosis includes fracture, dislocation,  ligament injury.  This is not an exhaustive list.  Problem list/ ED course/ Critical interventions/ Medical management: HPI: See above Vital signs within normal range and stable throughout visit. Laboratory/imaging studies significant for: See above. On physical examination, patient is afebrile and appears in no acute distress.  There was tenderness to palpation to right elbow.  Mild skin abrasion also noticed near the  right antecubital area.  There was limited active range of motion of the right elbow due to pain.  Neuro vascular function of the right arm intact. X-ray of right elbow showed no evidence of fracture or dislocation.  Given history, exam and imaging, I have low suspicion for fracture, dislocation or significant ligamentous injury.  Given shoulder sling and Percocet for pain. Advised patient to take Tylenol/ibuprofen/naproxen for pain, follow-up with orthopedics for further evaluation and management, return to the ER if new or worsening symptoms.  Patient presents with _ joint pain. Given history, exam and workup patient likely has arthritis. I have low suspicion for fracture, dislocation, significant ligamentous injury, septic arthritis, gout flare, new autoimmune arthropathy, or gonococcal arthropathy.  I have reviewed the patient home medicines and have made adjustments as needed.  Cardiac monitoring/EKG: The patient was maintained on a cardiac monitor.  I personally reviewed and interpreted the cardiac monitor which showed an underlying rhythm of: sinus rhythm.  Additional history obtained: External records from outside source obtained and reviewed including: Chart review including previous notes, labs, imaging.  Consultations obtained:  Disposition Continued outpatient therapy. Follow-up with orthopedics recommended for reevaluation of symptoms. Treatment plan discussed with patient.  Pt acknowledged understanding was agreeable to the plan. Worrisome signs and symptoms were  discussed with patient, and patient acknowledged understanding to return to the ED if they noticed these signs and symptoms. Patient was stable upon discharge.   This chart was dictated using voice recognition software.  Despite best efforts to proofread,  errors can occur which can change the documentation meaning.          Final Clinical Impression(s) / ED Diagnoses Final diagnoses:  Fall, initial encounter  Right elbow pain    Rx / DC Orders ED Discharge Orders          Ordered    oxyCODONE-acetaminophen (PERCOCET/ROXICET) 5-325 MG tablet  Every 6 hours PRN        11/23/22 2151              Jeanelle Malling, PA 11/23/22 2211    Lonell Grandchild, MD 11/26/22 [phone]

## 2022-11-23 NOTE — ED Triage Notes (Signed)
Pt fell landing on his Rt arm. States he "heard something crack". C/O pain to rt upper arm.

## 2022-11-23 NOTE — Discharge Instructions (Addendum)
Please tylenol/ibuprofen every 6 hours or Percocet as needed for pain. I recommend close follow-up with orthopedics for reevaluation.  Please do not hesitate to return to emergency department if worrisome signs symptoms we discussed become apparent.

## 2022-11-28 ENCOUNTER — Telehealth: Payer: Self-pay

## 2022-11-28 NOTE — Telephone Encounter (Signed)
Transition Care Management Unsuccessful Follow-up Telephone Call  Date of discharge and from where:  [facility]  Attempts:  2nd Attempt  Reason for unsuccessful TCM follow-up call:  Left voice message     Lenard Forth [facility] Guide, [facility] [phone] [address] [place], [place], [place] [address] Phone: [phone] Email: [email or web address]

## 2022-11-28 NOTE — Telephone Encounter (Signed)
Transition Care Management Unsuccessful Follow-up Telephone Call  Date of discharge and from where:  [facility]  Attempts:  1st Attempt  Reason for unsuccessful TCM follow-up call:  Unable to leave message   [facility] Guide, [facility] [phone] [address] [place], [place], [place] [address] Phone: [phone] Email: [email or web address]

## 2022-12-04 DIAGNOSIS — H5203 Hypermetropia, bilateral: Secondary | ICD-10-CM | POA: Diagnosis not present

## 2022-12-04 DIAGNOSIS — H2513 Age-related nuclear cataract, bilateral: Secondary | ICD-10-CM | POA: Diagnosis not present

## 2022-12-04 DIAGNOSIS — E119 Type 2 diabetes mellitus without complications: Secondary | ICD-10-CM | POA: Diagnosis not present

## 2022-12-04 DIAGNOSIS — H524 Presbyopia: Secondary | ICD-10-CM | POA: Diagnosis not present

## 2022-12-04 DIAGNOSIS — H35372 Puckering of macula, left eye: Secondary | ICD-10-CM | POA: Diagnosis not present

## 2022-12-04 DIAGNOSIS — H33103 Unspecified retinoschisis, bilateral: Secondary | ICD-10-CM | POA: Diagnosis not present

## 2022-12-18 ENCOUNTER — Other Ambulatory Visit: Payer: Self-pay

## 2022-12-18 DIAGNOSIS — E118 Type 2 diabetes mellitus with unspecified complications: Secondary | ICD-10-CM

## 2022-12-18 MED ORDER — EMPAGLIFLOZIN 25 MG PO TABS
25.0000 mg | ORAL_TABLET | Freq: Every day | ORAL | 2 refills | Status: DC
Start: 2022-12-18 — End: 2023-03-19

## 2023-03-14 ENCOUNTER — Other Ambulatory Visit: Payer: Self-pay | Admitting: Family Medicine

## 2023-03-14 DIAGNOSIS — K2100 Gastro-esophageal reflux disease with esophagitis, without bleeding: Secondary | ICD-10-CM

## 2023-03-19 ENCOUNTER — Ambulatory Visit (INDEPENDENT_AMBULATORY_CARE_PROVIDER_SITE_OTHER): Payer: Medicare HMO | Admitting: Family Medicine

## 2023-03-19 ENCOUNTER — Encounter: Payer: Self-pay | Admitting: Family Medicine

## 2023-03-19 VITALS — BP 138/82 | HR 75 | Ht 64.0 in | Wt 148.0 lb

## 2023-03-19 DIAGNOSIS — E038 Other specified hypothyroidism: Secondary | ICD-10-CM | POA: Diagnosis not present

## 2023-03-19 DIAGNOSIS — Z7984 Long term (current) use of oral hypoglycemic drugs: Secondary | ICD-10-CM | POA: Diagnosis not present

## 2023-03-19 DIAGNOSIS — E559 Vitamin D deficiency, unspecified: Secondary | ICD-10-CM | POA: Diagnosis not present

## 2023-03-19 DIAGNOSIS — E7849 Other hyperlipidemia: Secondary | ICD-10-CM | POA: Diagnosis not present

## 2023-03-19 DIAGNOSIS — E1165 Type 2 diabetes mellitus with hyperglycemia: Secondary | ICD-10-CM

## 2023-03-19 DIAGNOSIS — E785 Hyperlipidemia, unspecified: Secondary | ICD-10-CM

## 2023-03-19 DIAGNOSIS — E1169 Type 2 diabetes mellitus with other specified complication: Secondary | ICD-10-CM

## 2023-03-19 DIAGNOSIS — R7301 Impaired fasting glucose: Secondary | ICD-10-CM

## 2023-03-19 DIAGNOSIS — I10 Essential (primary) hypertension: Secondary | ICD-10-CM | POA: Diagnosis not present

## 2023-03-19 MED ORDER — EMPAGLIFLOZIN 25 MG PO TABS
25.0000 mg | ORAL_TABLET | Freq: Every day | ORAL | 2 refills | Status: AC
Start: 2023-03-19 — End: 2023-12-14

## 2023-03-19 MED ORDER — EMPAGLIFLOZIN 25 MG PO TABS: 25.0000 mg | ORAL_TABLET | Freq: Every day | ORAL | 2 refills | Status: DC

## 2023-03-19 NOTE — Assessment & Plan Note (Signed)
The patient reports taking atorvastatin 80 mg daily No complaints of muscle aches and pain Encourage decreasing his intake of greasy, starchy, and fatty foods with increased physical activity Lab Results  Component Value Date   CHOL 173 11/13/2022   HDL 75 11/13/2022   LDLCALC 82 11/13/2022   LDLDIRECT 97.0 11/23/2020   TRIG 87 11/13/2022   CHOLHDL 2.3 11/13/2022

## 2023-03-19 NOTE — Assessment & Plan Note (Signed)
He takes Jardiance 25 mg daily and glipizide 10 mg twice daily He denies polyuria, polyphagia, polydipsia Recommended reducing his intake of high sugar foods and beverages with increased physical activity The patient verbalized understanding Lab Results  Component Value Date   HGBA1C 7.6 (H) 11/13/2022

## 2023-03-19 NOTE — Assessment & Plan Note (Signed)
Controlled Asymptomatic in the clinic Reports taking lisinopril 5 mg daily Low-sodium diet and increase physical activity encouraged BP Readings from Last 3 Encounters:  03/19/23 138/82  11/23/22 110/74  11/13/22 133/79

## 2023-03-19 NOTE — Progress Notes (Signed)
Established Patient Office Visit  Subjective:  Patient ID: Edward Mcgee, male    DOB: 11-04-1955  Age: 67 y.o. MRN: [id number]  CC:  Chief Complaint  Patient presents with   Care Management    4 month f/u, needs to recertify for jardiance.    HPI [name] is a 67 y.o. male with past medical history of type 2 diabetes, hyperlipidemia, hypertension, presents for f/u of  chronic medical conditions. For the details of today's visit, please refer to the assessment and plan.     Past Medical History:  Diagnosis Date   DM (diabetes mellitus), type 2, uncontrolled 02/10/2013   GERD (gastroesophageal reflux disease)    Hyperlipidemia    Hypertension 06/19/2021    Past Surgical History:  Procedure Laterality Date   NO PAST SURGERIES      Family History  Problem Relation Age of Onset   Hypertension Mother    Breast cancer Sister    Diabetes type II Sister    Hypertension Sister    Breast cancer Sister    Diabetes Mellitus II Sister    Uterine cancer Sister    Diabetes Mellitus II Sister    Diabetes type II Brother    Diabetes Other    Colon cancer Neg Hx    Esophageal cancer Neg Hx    Rectal cancer Neg Hx    Stomach cancer Neg Hx    Liver cancer Neg Hx    Prostate cancer Neg Hx     Social History   Socioeconomic History   Marital status: Married    Spouse name: Not on file   Number of children: 3   Years of education: 11   Highest education level: Not on file  Occupational History   Occupation: Maintenance  Tobacco Use   Smoking status: Never   Smokeless tobacco: Never  Vaping Use   Vaping status: Never Used  Substance and Sexual Activity   Alcohol use: Yes    Comment: socially   Drug use: No   Sexual activity: Not on file  Other Topics Concern   Not on file  Social History Narrative   Lives with his wife, pt is retired.    Social Determinants of Health   Financial Resource Strain: Low Risk  (08/08/2022)   Overall Financial Resource Strain  (CARDIA)    Difficulty of Paying Living Expenses: Not hard at all  Food Insecurity: No Food Insecurity (08/08/2022)   Hunger Vital Sign    Worried About Running Out of Food in the Last Year: Never true    Ran Out of Food in the Last Year: Never true  Transportation Needs: No Transportation Needs (08/08/2022)   PRAPARE - Administrator, Civil Service (Medical): No    Lack of Transportation (Non-Medical): No  Physical Activity: Insufficiently Active (08/08/2022)   Exercise Vital Sign    Days of Exercise per Week: 1 day    Minutes of Exercise per Session: 60 min  Stress: No Stress Concern Present (08/08/2022)   [employer] of Occupational Health - Occupational Stress Questionnaire    Feeling of Stress : Not at all  Social Connections: Socially Integrated (08/08/2022)   Social Connection and Isolation Panel [NHANES]    Frequency of Communication with Friends and Family: More than three times a week    Frequency of Social Gatherings with Friends and Family: More than three times a week    Attends Religious Services: More than 4 times per year  Active Member of Clubs or Organizations: Yes    Attends Banker Meetings: More than 4 times per year    Marital Status: Married  Catering manager Violence: Not At Risk (08/08/2022)   Humiliation, Afraid, Rape, and Kick questionnaire    Fear of Current or Ex-Partner: No    Emotionally Abused: No    Physically Abused: No    Sexually Abused: No    Outpatient Medications Prior to Visit  Medication Sig Dispense Refill   acetaminophen (TYLENOL) 325 MG tablet Take 650 mg by mouth every 6 (six) hours as needed.     Alcohol Swabs (DROPSAFE ALCOHOL PREP) 70 % PADS USE AS DIRECTED 300 each 5   atorvastatin (LIPITOR) 80 MG tablet Take 1 tablet (80 mg total) by mouth daily. 90 tablet 3   blood glucose meter kit and supplies KIT Dispense based on patient and insurance preference. Use before breakfast and at bedtime. ICD E11.9 1  each 0   Cholecalciferol (VITAMIN D3 PO) Take by mouth.     glipiZIDE (GLUCOTROL) 10 MG tablet Take 1 tablet (10 mg total) by mouth 2 (two) times daily before a meal. 60 tablet 3   glucose blood (GNP TRUE METRIX GLUCOSE STRIPS) test strip Use as instructed 100 each 12   glucose blood (ONETOUCH ULTRA) test strip USE 2 test strips,  TO CHECK GLUCOSE BID 180 each 3   lisinopril (ZESTRIL) 5 MG tablet Take 1 tablet (5 mg total) by mouth daily. 90 tablet 3   Magnesium 200 MG TABS Take 1 tablet by mouth daily.     omeprazole (PRILOSEC) 20 MG capsule TOME 1 CAPSULA CADA DIA 90 capsule 3   tamsulosin (FLOMAX) 0.4 MG CAPS capsule Take 1 capsule (0.4 mg total) by mouth daily. 90 capsule 3   TRUEplus Lancets 33G MISC TEST BLOOD SUGAR AS DIRECTED 300 each 5   vitamin E 1000 UNIT capsule Take 1,000 Units by mouth daily.     Zinc Sulfate (ZINC 15 PO) Take by mouth.     empagliflozin (JARDIANCE) 25 MG TABS tablet Take 1 tablet (25 mg total) by mouth daily before breakfast. 90 tablet 2   Facility-Administered Medications Prior to Visit  Medication Dose Route Frequency Provider Last Rate Last Admin   0.9 %  sodium chloride infusion  500 mL Intravenous Continuous Rachael Fee, MD        Allergies  Allergen Reactions   Other Shortness Of Breath    Raspberry juice. Pt report SOB,itchy and swelling.    Peanut-Containing Drug Products Other (See Comments)    Itching, can breath and hives    ROS Review of Systems  Constitutional:  Negative for fatigue and fever.  Eyes:  Negative for visual disturbance.  Respiratory:  Negative for chest tightness and shortness of breath.   Cardiovascular:  Negative for chest pain and palpitations.  Neurological:  Negative for dizziness and headaches.      Objective:    Physical Exam HENT:     Head: Normocephalic.     Right Ear: External ear normal.     Left Ear: External ear normal.     Nose: No congestion or rhinorrhea.     Mouth/Throat:     Mouth: Mucous  membranes are moist.  Cardiovascular:     Rate and Rhythm: Regular rhythm.     Heart sounds: No murmur heard. Pulmonary:     Effort: No respiratory distress.     Breath sounds: Normal breath sounds.  Neurological:  Mental Status: He is alert.     BP 138/82 (BP Location: Left Arm)   Pulse 75   Ht 5\' 4"  (1.626 m)   Wt 148 lb 0.6 oz (67.2 kg)   SpO2 98%   BMI 25.41 kg/m  Wt Readings from Last 3 Encounters:  03/19/23 148 lb 0.6 oz (67.2 kg)  11/23/22 147 lb 11.3 oz (67 kg)  11/13/22 148 lb (67.1 kg)    Lab Results  Component Value Date   TSH 1.420 11/13/2022   Lab Results  Component Value Date   WBC 6.4 11/13/2022   HGB 15.7 11/13/2022   HCT 47.4 11/13/2022   MCV 83 11/13/2022   PLT 327 11/13/2022   Lab Results  Component Value Date   NA 137 11/13/2022   K 4.5 11/13/2022   CO2 25 11/13/2022   GLUCOSE 150 (H) 11/13/2022   BUN 17 11/13/2022   CREATININE 0.99 11/13/2022   BILITOT 0.5 11/13/2022   ALKPHOS 92 11/13/2022   AST 20 11/13/2022   ALT 24 11/13/2022   PROT 7.0 11/13/2022   ALBUMIN 4.6 11/13/2022   CALCIUM 9.8 11/13/2022   ANIONGAP 10 05/07/2016   EGFR 83 11/13/2022   GFR 89.77 11/23/2020   Lab Results  Component Value Date   CHOL 173 11/13/2022   Lab Results  Component Value Date   HDL 75 11/13/2022   Lab Results  Component Value Date   LDLCALC 82 11/13/2022   Lab Results  Component Value Date   TRIG 87 11/13/2022   Lab Results  Component Value Date   CHOLHDL 2.3 11/13/2022   Lab Results  Component Value Date   HGBA1C 7.6 (H) 11/13/2022      Assessment & Plan:  Primary hypertension Assessment & Plan: Controlled Asymptomatic in the clinic Reports taking lisinopril 5 mg daily Low-sodium diet and increase physical activity encouraged BP Readings from Last 3 Encounters:  03/19/23 138/82  11/23/22 110/74  11/13/22 133/79      Other hyperlipidemia Assessment & Plan: The patient reports taking atorvastatin 80 mg daily No  complaints of muscle aches and pain Encourage decreasing his intake of greasy, starchy, and fatty foods with increased physical activity Lab Results  Component Value Date   CHOL 173 11/13/2022   HDL 75 11/13/2022   LDLCALC 82 11/13/2022   LDLDIRECT 97.0 11/23/2020   TRIG 87 11/13/2022   CHOLHDL 2.3 11/13/2022      Type 2 diabetes mellitus with hyperglycemia, without long-term current use of insulin (HCC) Assessment & Plan: He takes Jardiance 25 mg daily and glipizide 10 mg twice daily He denies polyuria, polyphagia, polydipsia Recommended reducing his intake of high sugar foods and beverages with increased physical activity The patient verbalized understanding Lab Results  Component Value Date   HGBA1C 7.6 (H) 11/13/2022     Orders: -     Empagliflozin; Take 1 tablet (25 mg total) by mouth daily before breakfast.  Dispense: 90 tablet; Refill: 2  IFG (impaired fasting glucose) -     Hemoglobin A1c  Vitamin D deficiency -     VITAMIN D 25 Hydroxy (Vit-D Deficiency, Fractures)  Other specified hypothyroidism -     TSH + free T4  Hyperlipidemia associated with type 2 diabetes mellitus (HCC) -     Lipid panel -     CMP14+EGFR -     CBC with Differential/Platelet  Note: This chart has been completed using [employer] software, and while attempts have been made to ensure accuracy, certain  words and phrases may not be transcribed as intended.    Follow-up: Return in about 4 months (around 07/20/2023).   Gilmore Laroche, FNP

## 2023-03-19 NOTE — Patient Instructions (Addendum)
  Aprecio la oportunidad de brindarle atencin hoy!    Seguimiento: 4 meses  Laboratorios: pase por el laboratorio hoy para que le extraigan sangre (CBC, CMP, TSH, perfil lipdico, HgA1c, Vit D)  Para controlar la diabetes, recomiendo los siguientes cambios en el estilo de vida:  Reduzca la ingesta de alimentos y bebidas con alto contenido de azcar: limite los alimentos y bebidas con alto contenido de azcar para ayudar a regular los niveles de Banker. Aumente el consumo de alimentos ricos en nutrientes: concntrese en incorporar ms frutas, verduras y cereales integrales a su dieta. Elija protenas magras: opte por protenas magras como pollo, pescado, frijoles y legumbres. Seleccione productos lcteos bajos en grasa: elija opciones de lcteos bajos en grasa o sin grasa. Minimice las grasas saturadas, las grasas trans y el colesterol: reduzca la ingesta de alimentos con alto contenido de grasas saturadas, cidos grasos trans y Oncologist. Realice actividad fsica regular: intente realizar al menos 30 minutos de caminata rpida u otra actividad moderada al menos 5 das a la [place].   Adjunto a su AVS encontrar valiosos recursos para la autoformacin. Recomiendo encarecidamente dedicar algo de tiempo a examinarlos a fondo.   Contine con una dieta saludable para el corazn y aumente su actividad fsica. Intente hacer ejercicio durante 30 minutos al [employer] a la [place].    Fue un placer verte y espero seguir trabajando juntos por tu salud y Health visitor. No dude en llamar al consultorio si necesita atencin o tiene preguntas sobre su atencin.  En caso de emergencia, visite el Departamento de Emergencias para recibir atencin de [place] o comunquese con Ferne Coe clnica al [phone] para programar una cita. Estamos aqu para ayudarte!   Que tengas un da y Forensic psychologist. Con gratitud, Gilmore Laroche MSN, FNP-BC

## 2023-03-20 LAB — CBC WITH DIFFERENTIAL/PLATELET
Basophils Absolute: 0 10*3/uL (ref 0.0–0.2)
Basos: 1 %
EOS (ABSOLUTE): 0.3 10*3/uL (ref 0.0–0.4)
Eos: 5 %
Hematocrit: 48.1 % (ref 37.5–51.0)
Hemoglobin: 15.2 g/dL (ref 13.0–17.7)
Immature Grans (Abs): 0 10*3/uL (ref 0.0–0.1)
Immature Granulocytes: 0 %
Lymphocytes Absolute: 2.4 10*3/uL (ref 0.7–3.1)
Lymphs: 37 %
MCH: 27.4 pg (ref 26.6–33.0)
MCHC: 31.6 g/dL (ref 31.5–35.7)
MCV: 87 fL (ref 79–97)
Monocytes Absolute: 0.5 10*3/uL (ref 0.1–0.9)
Monocytes: 7 %
Neutrophils Absolute: 3.3 10*3/uL (ref 1.4–7.0)
Neutrophils: 50 %
Platelets: 251 10*3/uL (ref 150–450)
RBC: 5.54 x10E6/uL (ref 4.14–5.80)
RDW: 13.2 % (ref 11.6–15.4)
WBC: 6.5 10*3/uL (ref 3.4–10.8)

## 2023-03-20 LAB — CMP14+EGFR
ALT: 30 [IU]/L (ref 0–44)
AST: 22 [IU]/L (ref 0–40)
Albumin: 4.5 g/dL (ref 3.9–4.9)
Alkaline Phosphatase: 109 [IU]/L (ref 44–121)
BUN/Creatinine Ratio: 21 (ref 10–24)
BUN: 19 mg/dL (ref 8–27)
Bilirubin Total: 0.3 mg/dL (ref 0.0–1.2)
CO2: 24 mmol/L (ref 20–29)
Calcium: 9.6 mg/dL (ref 8.6–10.2)
Chloride: 105 mmol/L (ref 96–106)
Creatinine, Ser: 0.92 mg/dL (ref 0.76–1.27)
Globulin, Total: 2.2 g/dL (ref 1.5–4.5)
Glucose: 140 mg/dL — ABNORMAL HIGH (ref 70–99)
Potassium: 4.4 mmol/L (ref 3.5–5.2)
Sodium: 142 mmol/L (ref 134–144)
Total Protein: 6.7 g/dL (ref 6.0–8.5)
eGFR: 91 mL/min/{1.73_m2} (ref >59)

## 2023-03-20 LAB — LIPID PANEL
Chol/HDL Ratio: 2.2 {ratio} (ref 0.0–5.0)
Cholesterol, Total: 148 mg/dL (ref 100–199)
HDL: 66 mg/dL (ref >39)
LDL Chol Calc (NIH): 66 mg/dL (ref 0–99)
Triglycerides: 87 mg/dL (ref 0–149)
VLDL Cholesterol Cal: 16 mg/dL (ref 5–40)

## 2023-03-20 LAB — HEMOGLOBIN A1C
Est. average glucose Bld gHb Est-mCnc: 148 mg/dL
Hgb A1c MFr Bld: 6.8 % — ABNORMAL HIGH (ref 4.8–5.6)

## 2023-03-20 LAB — VITAMIN D 25 HYDROXY (VIT D DEFICIENCY, FRACTURES): Vit D, 25-Hydroxy: 53.5 ng/mL (ref 30.0–100.0)

## 2023-03-20 LAB — TSH+FREE T4
Free T4: 1.46 ng/dL (ref 0.82–1.77)
TSH: 1.77 u[IU]/mL (ref 0.450–4.500)

## 2023-03-25 ENCOUNTER — Other Ambulatory Visit: Payer: Self-pay | Admitting: Family Medicine

## 2023-03-25 NOTE — Progress Notes (Signed)
Please inform the patient that his hemoglobin A1c has decreased from 7.6 to 6.8. Great job on this improvement! I recommend continuing his current treatment regimen, along with decreasing his intake of high-sugar foods and beverages and increasing physical activity. All other lab results are stable.

## 2023-03-26 ENCOUNTER — Telehealth: Payer: Self-pay | Admitting: Family Medicine

## 2023-03-26 NOTE — Progress Notes (Signed)
kindly provide the patient with a sample of Jardiance 25 mg to be taken once daily, and inform the patient to take glipizide 10 mg twice daily

## 2023-03-26 NOTE — Telephone Encounter (Signed)
Pt will be coming by the office to pick up jardiance samples 25mg 

## 2023-03-26 NOTE — Telephone Encounter (Signed)
Patient called needs nurse to return call explain some medications. Call back # [phone].

## 2023-04-10 ENCOUNTER — Encounter: Payer: Self-pay | Admitting: Orthopedic Surgery

## 2023-04-10 ENCOUNTER — Other Ambulatory Visit (INDEPENDENT_AMBULATORY_CARE_PROVIDER_SITE_OTHER): Payer: Self-pay

## 2023-04-10 ENCOUNTER — Other Ambulatory Visit: Payer: Self-pay

## 2023-04-10 ENCOUNTER — Ambulatory Visit: Payer: Medicare HMO | Admitting: Orthopedic Surgery

## 2023-04-10 DIAGNOSIS — M25561 Pain in right knee: Secondary | ICD-10-CM

## 2023-04-10 DIAGNOSIS — M25861 Other specified joint disorders, right knee: Secondary | ICD-10-CM

## 2023-04-10 DIAGNOSIS — G8929 Other chronic pain: Secondary | ICD-10-CM

## 2023-04-14 ENCOUNTER — Encounter: Payer: Self-pay | Admitting: Orthopedic Surgery

## 2023-04-14 MED ORDER — LIDOCAINE HCL 1 % IJ SOLN
5.0000 mL | INTRAMUSCULAR | Status: AC | PRN
Start: 2023-04-10 — End: 2023-04-10
  Administered 2023-04-10: 5 mL

## 2023-04-14 NOTE — Progress Notes (Signed)
Office Visit Note   Patient: Edward Mcgee           Date of Birth: 11-15-1955           MRN: [id number] Visit Date: 04/10/2023 Requested by: Gilmore Laroche, FNP [address] [place],  [place] [address] PCP: Gilmore Laroche, FNP  Subjective: Chief Complaint  Patient presents with   Right Knee - Pain    HPI: Edward Mcgee is a 67 y.o. male who presents to the office reporting right knee pain.  Has known history of ganglion cyst at the joint line of that right knee which does come out underneath the iliotibial band.  2 weeks ago the knee flared up.  Difficult for him to sleep.  Reports lateral pain.  Has had prior success with aspiration of the cystic structure about 2 years ago..                ROS: All systems reviewed are negative as they relate to the chief complaint within the history of present illness.  Patient denies fevers or chills.  Assessment & Plan: Visit Diagnoses:  1. Chronic pain of right knee     Plan: Impression is small recurrent cyst and a moderately arthritic lateral compartment of the right knee.  No effusion in the knee.  Aspiration of the cyst is performed again today.  He may be heading for intervention.  Follow-up as needed.  Follow-Up Instructions: No follow-ups on file.   Orders:  Orders Placed This Encounter  Procedures   XR KNEE 3 VIEW RIGHT   US Guided Needle Placement - No Linked Charges   No orders of the defined types were placed in this encounter.     Procedures: Large Joint Inj: R knee on 04/10/2023 10:57 AM Indications: diagnostic evaluation, joint swelling and pain Details: 18 G 1.5 in needle, ultrasound-guided lateral approach  Arthrogram: No  Medications: 5 mL lidocaine 1 % Aspirate: 2 mL Outcome: tolerated well, no immediate complications Procedure, treatment alternatives, risks and benefits explained, specific risks discussed. Consent was given by the patient. Immediately prior to procedure a time out was called to verify  the correct patient, procedure, equipment, support staff and site/side marked as required. Patient was prepped and draped in the usual sterile fashion.       Clinical Data: No additional findings.  Objective: Vital Signs: There were no vitals taken for this visit.  Physical Exam:  Constitutional: Patient appears well-developed HEENT:  Head: Normocephalic Eyes:EOM are normal Neck: Normal range of motion Cardiovascular: Normal rate Pulmonary/chest: Effort normal Neurologic: Patient is alert Skin: Skin is warm Psychiatric: Patient has normal mood and affect  Ortho Exam: Ortho exam demonstrates full active and passive range of motion of the right knee with no effusion.  Cystic structure measures about 1-1/2 x 1/2 cm possibly 2 x 2 cm.  Comes out on the undersurface of the iliotibial band near the midpoint of the lateral joint line.  No weakness in ankle dorsiflexion or plantarflexion.  No other masses lymphadenopathy or skin changes noted in that right knee region.  Collateral and cruciate ligaments are stable.  Specialty Comments:  No specialty comments available.  Imaging: No results found.   PMFS History: Patient Active Problem List   Diagnosis Date Noted   Annual physical exam 07/18/2021   Elevated alkaline phosphatase level 07/18/2021   Need for immunization against influenza 07/18/2021   Hypertension 06/19/2021   Atrophy of quadriceps femoris muscle 01/30/2019   GERD (gastroesophageal reflux  disease) 12/24/2017   Groin rash 12/24/2017   Benign prostatic hyperplasia with incomplete bladder emptying 10/23/2017   Positive fecal occult blood test 10/23/2017   Vocal process granuloma 03/01/2017   Laryngopharyngeal reflux (LPR) 03/01/2017   Seborrheic dermatitis of scalp 08/16/2016   Muscle cramps 05/27/2015   Leg pain, lateral, right 05/27/2015   AV block, 1st degree 02/10/2013   Type 2 diabetes mellitus with hyperglycemia (HCC) 02/10/2013   Hyperlipidemia 02/25/2007    BACK PAIN, LUMBAR, CHRONIC 05/31/2006   Past Medical History:  Diagnosis Date   DM (diabetes mellitus), type 2, uncontrolled 02/10/2013   GERD (gastroesophageal reflux disease)    Hyperlipidemia    Hypertension 06/19/2021    Family History  Problem Relation Age of Onset   Hypertension Mother    Breast cancer Sister    Diabetes type II Sister    Hypertension Sister    Breast cancer Sister    Diabetes Mellitus II Sister    Uterine cancer Sister    Diabetes Mellitus II Sister    Diabetes type II Brother    Diabetes Other    Colon cancer Neg Hx    Esophageal cancer Neg Hx    Rectal cancer Neg Hx    Stomach cancer Neg Hx    Liver cancer Neg Hx    Prostate cancer Neg Hx     Past Surgical History:  Procedure Laterality Date   NO PAST SURGERIES     Social History   Occupational History   Occupation: Maintenance  Tobacco Use   Smoking status: Never   Smokeless tobacco: Never  Vaping Use   Vaping status: Never Used  Substance and Sexual Activity   Alcohol use: Yes    Comment: socially   Drug use: No   Sexual activity: Not on file

## 2023-05-20 ENCOUNTER — Other Ambulatory Visit: Payer: Self-pay | Admitting: Family Medicine

## 2023-05-20 DIAGNOSIS — E118 Type 2 diabetes mellitus with unspecified complications: Secondary | ICD-10-CM

## 2023-07-22 ENCOUNTER — Ambulatory Visit (INDEPENDENT_AMBULATORY_CARE_PROVIDER_SITE_OTHER): Payer: Medicare HMO | Admitting: Family Medicine

## 2023-07-22 ENCOUNTER — Encounter: Payer: Self-pay | Admitting: Family Medicine

## 2023-07-22 VITALS — BP 137/76 | HR 70 | Ht 64.0 in | Wt 148.1 lb

## 2023-07-22 DIAGNOSIS — E038 Other specified hypothyroidism: Secondary | ICD-10-CM | POA: Diagnosis not present

## 2023-07-22 DIAGNOSIS — L309 Dermatitis, unspecified: Secondary | ICD-10-CM | POA: Insufficient documentation

## 2023-07-22 DIAGNOSIS — E1165 Type 2 diabetes mellitus with hyperglycemia: Secondary | ICD-10-CM | POA: Diagnosis not present

## 2023-07-22 DIAGNOSIS — E559 Vitamin D deficiency, unspecified: Secondary | ICD-10-CM | POA: Diagnosis not present

## 2023-07-22 DIAGNOSIS — Z7984 Long term (current) use of oral hypoglycemic drugs: Secondary | ICD-10-CM

## 2023-07-22 DIAGNOSIS — E785 Hyperlipidemia, unspecified: Secondary | ICD-10-CM | POA: Diagnosis not present

## 2023-07-22 DIAGNOSIS — I10 Essential (primary) hypertension: Secondary | ICD-10-CM

## 2023-07-22 DIAGNOSIS — K219 Gastro-esophageal reflux disease without esophagitis: Secondary | ICD-10-CM | POA: Diagnosis not present

## 2023-07-22 DIAGNOSIS — E1169 Type 2 diabetes mellitus with other specified complication: Secondary | ICD-10-CM | POA: Diagnosis not present

## 2023-07-22 MED ORDER — MOMETASONE FUROATE 0.1 % EX CREA
TOPICAL_CREAM | CUTANEOUS | 1 refills | Status: AC
Start: 2023-07-22 — End: ?

## 2023-07-22 NOTE — Patient Instructions (Signed)

## 2023-07-22 NOTE — Assessment & Plan Note (Signed)
 Controlled Asymptomatic in the clinic encouraged to continue taking lisinopril  5 mg daily A low-sodium diet of less than 2300 mg daily is recommended, along with increased physical activity of moderate intensity, aiming for 150 minutes weekly. The patient is encouraged to continue with these lifestyle modifications to help manage their blood pressure effectively.  BP Readings from Last 3 Encounters:  07/22/23 137/76  03/19/23 138/82  11/23/22 110/74

## 2023-07-22 NOTE — Assessment & Plan Note (Signed)
 Reports compliance on omeprazole  20 mg daily GERD diet encouraged

## 2023-07-22 NOTE — Assessment & Plan Note (Signed)
 Refilled topical corticosteroid

## 2023-07-22 NOTE — Assessment & Plan Note (Signed)
 The patient reports compliance with Jardiance  25 mg daily and Glipizide  10 mg twice daily. He denies polyuria, polyphagia, and polydipsia. It is recommended to continue his current treatment regimen, reduce intake of high-sugar foods and beverages, and increase physical activity. The patient verbalized understanding. Lab Results  Component Value Date   HGBA1C 6.8 (H) 03/19/2023

## 2023-07-22 NOTE — Progress Notes (Signed)
 Established Patient Office Visit  Subjective:  Patient ID: Edward Mcgee, male    DOB: [birth year]  Age: 68 y.o. MRN: [id number]  CC:  Chief Complaint  Patient presents with   Follow-up    4 month f/u    HPI Edward Mcgee is a 68 y.o. male with past medical history of type II diabetes, hypertension, GERD presents for f/u of  chronic medical conditions. For the details of today's visit, please refer to the assessment and plan.     Past Medical History:  Diagnosis Date   DM (diabetes mellitus), type 2, uncontrolled 02/10/2013   GERD (gastroesophageal reflux disease)    Hyperlipidemia    Hypertension 06/19/2021    Past Surgical History:  Procedure Laterality Date   NO PAST SURGERIES      Family History  Problem Relation Age of Onset   Hypertension Mother    Breast cancer Sister    Diabetes type II Sister    Hypertension Sister    Breast cancer Sister    Diabetes Mellitus II Sister    Uterine cancer Sister    Diabetes Mellitus II Sister    Diabetes type II Brother    Diabetes Other    Colon cancer Neg Hx    Esophageal cancer Neg Hx    Rectal cancer Neg Hx    Stomach cancer Neg Hx    Liver cancer Neg Hx    Prostate cancer Neg Hx     Social History   Socioeconomic History   Marital status: Married    Spouse name: Not on file   Number of children: 3   Years of education: 11   Highest education level: Not on file  Occupational History   Occupation: Maintenance  Tobacco Use   Smoking status: Never   Smokeless tobacco: Never  Vaping Use   Vaping status: Never Used  Substance and Sexual Activity   Alcohol  use: Yes    Comment: socially   Drug use: No   Sexual activity: Yes  Other Topics Concern   Not on file  Social History Narrative   Lives with his wife, pt is retired.    Social Drivers of Corporate investment banker Strain: Low Risk  (08/08/2022)   Overall Financial Resource Strain (CARDIA)    Difficulty of Paying Living Expenses: Not hard at all   Food Insecurity: No Food Insecurity (08/08/2022)   Hunger Vital Sign    Worried About Running Out of Food in the Last Year: Never true    Ran Out of Food in the Last Year: Never true  Transportation Needs: No Transportation Needs (08/08/2022)   PRAPARE - Administrator, Civil Service (Medical): No    Lack of Transportation (Non-Medical): No  Physical Activity: Insufficiently Active (08/08/2022)   Exercise Vital Sign    Days of Exercise per Week: 1 day    Minutes of Exercise per Session: 60 min  Stress: No Stress Concern Present (08/08/2022)   [employer] of Occupational Health - Occupational Stress Questionnaire    Feeling of Stress : Not at all  Social Connections: Socially Integrated (08/08/2022)   Social Connection and Isolation Panel [NHANES]    Frequency of Communication with Friends and Family: More than three times a week    Frequency of Social Gatherings with Friends and Family: More than three times a week    Attends Religious Services: More than 4 times per year    Active Member of [employer] or Organizations: Yes  Attends Banker Meetings: More than 4 times per year    Marital Status: Married  Catering manager Violence: Not At Risk (08/08/2022)   Humiliation, Afraid, Rape, and Kick questionnaire    Fear of Current or Ex-Partner: No    Emotionally Abused: No    Physically Abused: No    Sexually Abused: No    Outpatient Medications Prior to Visit  Medication Sig Dispense Refill   acetaminophen  (TYLENOL ) 325 MG tablet Take 650 mg by mouth every 6 (six) hours as needed.     Alcohol  Swabs (DROPSAFE ALCOHOL  PREP) 70 % PADS USE SEGUN INDICADO 300 each 3   atorvastatin  (LIPITOR) 80 MG tablet Take 1 tablet (80 mg total) by mouth daily. 90 tablet 3   blood glucose meter kit and supplies KIT Dispense based on patient and insurance preference. Use before breakfast and at bedtime. ICD E11.9 1 each 0   Cholecalciferol (VITAMIN D3 PO) Take by mouth.      empagliflozin  (JARDIANCE ) 25 MG TABS tablet Take 1 tablet (25 mg total) by mouth daily before breakfast. 90 tablet 2   glipiZIDE  (GLUCOTROL ) 10 MG tablet Take 1 tablet (10 mg total) by mouth 2 (two) times daily before a meal. 60 tablet 3   glucose blood (GNP TRUE METRIX GLUCOSE STRIPS) test strip Use as instructed 100 each 12   glucose blood (ONETOUCH ULTRA) test strip USE 2 test strips,  TO CHECK GLUCOSE BID 180 each 3   lisinopril  (ZESTRIL ) 5 MG tablet Take 1 tablet (5 mg total) by mouth daily. 90 tablet 3   Magnesium 200 MG TABS Take 1 tablet by mouth daily.     omeprazole  (PRILOSEC) 20 MG capsule TOME 1 CAPSULA CADA DIA 90 capsule 3   tamsulosin  (FLOMAX ) 0.4 MG CAPS capsule Take 1 capsule (0.4 mg total) by mouth daily. 90 capsule 3   TRUE METRIX BLOOD GLUCOSE TEST test strip HACER PRUEBA DE AZUCAR EN SANGRE SEGUN INDICADO 300 strip 3   TRUEplus Lancets 33G MISC HACER PRUEBA DE AZUCAR EN SANGRE SEGUN INDICADO 300 each 3   vitamin E 1000 UNIT capsule Take 1,000 Units by mouth daily.     Zinc Sulfate (ZINC 15 PO) Take by mouth.     Facility-Administered Medications Prior to Visit  Medication Dose Route Frequency Provider Last Rate Last Admin   0.9 %  sodium chloride  infusion  500 mL Intravenous Continuous Janel Medford, MD        Allergies  Allergen Reactions   Other Shortness Of Breath    Raspberry juice. Pt report SOB,itchy and swelling.    Peanut-Containing Drug Products Other (See Comments)    Itching, can breath and hives    ROS Review of Systems  Constitutional:  Negative for fatigue and fever.  Eyes:  Negative for visual disturbance.  Respiratory:  Negative for chest tightness and shortness of breath.   Cardiovascular:  Negative for chest pain and palpitations.  Skin:  Positive for rash.  Neurological:  Negative for dizziness and headaches.      Objective:    Physical Exam HENT:     Head: Normocephalic.     Right Ear: External ear normal.     Left Ear: External  ear normal.     Nose: No congestion or rhinorrhea.     Mouth/Throat:     Mouth: Mucous membranes are moist.  Cardiovascular:     Rate and Rhythm: Regular rhythm.     Heart sounds: No murmur heard. Pulmonary:  Effort: No respiratory distress.     Breath sounds: Normal breath sounds.  Skin:    Findings: Rash present.  Neurological:     Mental Status: He is alert.     BP 137/76   Pulse 70   Ht 5\' 4"  (1.626 m)   Wt 148 lb 1.9 oz (67.2 kg)   SpO2 96%   BMI 25.42 kg/m  Wt Readings from Last 3 Encounters:  07/22/23 148 lb 1.9 oz (67.2 kg)  03/19/23 148 lb 0.6 oz (67.2 kg)  11/23/22 147 lb 11.3 oz (67 kg)    Lab Results  Component Value Date   TSH 1.770 03/19/2023   Lab Results  Component Value Date   WBC 6.5 03/19/2023   HGB 15.2 03/19/2023   HCT 48.1 03/19/2023   MCV 87 03/19/2023   PLT 251 03/19/2023   Lab Results  Component Value Date   NA 142 03/19/2023   K 4.4 03/19/2023   CO2 24 03/19/2023   GLUCOSE 140 (H) 03/19/2023   BUN 19 03/19/2023   CREATININE 0.92 03/19/2023   BILITOT 0.3 03/19/2023   ALKPHOS 109 03/19/2023   AST 22 03/19/2023   ALT 30 03/19/2023   PROT 6.7 03/19/2023   ALBUMIN 4.5 03/19/2023   CALCIUM  9.6 03/19/2023   ANIONGAP 10 05/07/2016   EGFR 91 03/19/2023   GFR 89.77 11/23/2020   Lab Results  Component Value Date   CHOL 148 03/19/2023   Lab Results  Component Value Date   HDL 66 03/19/2023   Lab Results  Component Value Date   LDLCALC 66 03/19/2023   Lab Results  Component Value Date   TRIG 87 03/19/2023   Lab Results  Component Value Date   CHOLHDL 2.2 03/19/2023   Lab Results  Component Value Date   HGBA1C 6.8 (H) 03/19/2023      Assessment & Plan:  Type 2 diabetes mellitus with hyperglycemia, without long-term current use of insulin  (HCC) Assessment & Plan: The patient reports compliance with Jardiance  25 mg daily and Glipizide  10 mg twice daily. He denies polyuria, polyphagia, and polydipsia. It is  recommended to continue his current treatment regimen, reduce intake of high-sugar foods and beverages, and increase physical activity. The patient verbalized understanding. Lab Results  Component Value Date   HGBA1C 6.8 (H) 03/19/2023     Orders: -     Hemoglobin A1c  Primary hypertension Assessment & Plan: Controlled Asymptomatic in the clinic encouraged to continue taking lisinopril  5 mg daily A low-sodium diet of less than 2300 mg daily is recommended, along with increased physical activity of moderate intensity, aiming for 150 minutes weekly. The patient is encouraged to continue with these lifestyle modifications to help manage their blood pressure effectively.  BP Readings from Last 3 Encounters:  07/22/23 137/76  03/19/23 138/82  11/23/22 110/74      Hyperlipidemia associated with type 2 diabetes mellitus (HCC) Assessment & Plan: The patient reports compliance on atorvastatin  80 mg daily No complaints of muscle aches and pain Encourage decreasing his intake of greasy, starchy, and fatty foods with increased physical activity Lab Results  Component Value Date   CHOL 148 03/19/2023   HDL 66 03/19/2023   LDLCALC 66 03/19/2023   LDLDIRECT 97.0 11/23/2020   TRIG 87 03/19/2023   CHOLHDL 2.2 03/19/2023     Orders: -     Lipid panel -     CMP14+EGFR -     CBC with Differential/Platelet  Gastroesophageal reflux disease without esophagitis Assessment &  Plan: Reports compliance on omeprazole  20 mg daily GERD diet encouraged    Dermatitis Assessment & Plan: Refilled topical corticosteroid  Orders: -     Mometasone  Furoate; Apply qd as needed for rash  Dispense: 45 g; Refill: 1  TSH (thyroid -stimulating hormone deficiency) -     TSH + free T4  Vitamin D  deficiency -     VITAMIN D  25 Hydroxy (Vit-D Deficiency, Fractures)  Note: This chart has been completed using Engineer, civil (consulting) software, and while attempts have been made to ensure accuracy, certain  words and phrases may not be transcribed as intended.    Follow-up: Return in about 4 months (around 11/19/2023).   Wyonia Fontanella, FNP

## 2023-07-22 NOTE — Assessment & Plan Note (Signed)
 The patient reports compliance on atorvastatin  80 mg daily No complaints of muscle aches and pain Encourage decreasing his intake of greasy, starchy, and fatty foods with increased physical activity Lab Results  Component Value Date   CHOL 148 03/19/2023   HDL 66 03/19/2023   LDLCALC 66 03/19/2023   LDLDIRECT 97.0 11/23/2020   TRIG 87 03/19/2023   CHOLHDL 2.2 03/19/2023

## 2023-07-23 LAB — CBC WITH DIFFERENTIAL/PLATELET
Basophils Absolute: 0 10*3/uL (ref 0.0–0.2)
Basos: 1 %
EOS (ABSOLUTE): 0.4 10*3/uL (ref 0.0–0.4)
Eos: 6 %
Hematocrit: 44.8 % (ref 37.5–51.0)
Hemoglobin: 14.5 g/dL (ref 13.0–17.7)
Immature Grans (Abs): 0 10*3/uL (ref 0.0–0.1)
Immature Granulocytes: 0 %
Lymphocytes Absolute: 2.4 10*3/uL (ref 0.7–3.1)
Lymphs: 37 %
MCH: 27.4 pg (ref 26.6–33.0)
MCHC: 32.4 g/dL (ref 31.5–35.7)
MCV: 85 fL (ref 79–97)
Monocytes Absolute: 0.6 10*3/uL (ref 0.1–0.9)
Monocytes: 9 %
Neutrophils Absolute: 3.1 10*3/uL (ref 1.4–7.0)
Neutrophils: 47 %
Platelets: 259 10*3/uL (ref 150–450)
RBC: 5.29 x10E6/uL (ref 4.14–5.80)
RDW: 12.8 % (ref 11.6–15.4)
WBC: 6.4 10*3/uL (ref 3.4–10.8)

## 2023-07-23 LAB — LIPID PANEL
Chol/HDL Ratio: 2 {ratio} (ref 0.0–5.0)
Cholesterol, Total: 138 mg/dL (ref 100–199)
HDL: 69 mg/dL (ref >39)
LDL Chol Calc (NIH): 52 mg/dL (ref 0–99)
Triglycerides: 92 mg/dL (ref 0–149)
VLDL Cholesterol Cal: 17 mg/dL (ref 5–40)

## 2023-07-23 LAB — CMP14+EGFR
ALT: 16 [IU]/L (ref 0–44)
AST: 15 [IU]/L (ref 0–40)
Albumin: 4.3 g/dL (ref 3.9–4.9)
Alkaline Phosphatase: 80 [IU]/L (ref 44–121)
BUN/Creatinine Ratio: 20 (ref 10–24)
BUN: 18 mg/dL (ref 8–27)
Bilirubin Total: 0.6 mg/dL (ref 0.0–1.2)
CO2: 22 mmol/L (ref 20–29)
Calcium: 9.7 mg/dL (ref 8.6–10.2)
Chloride: 106 mmol/L (ref 96–106)
Creatinine, Ser: 0.91 mg/dL (ref 0.76–1.27)
Globulin, Total: 2 g/dL (ref 1.5–4.5)
Glucose: 124 mg/dL — ABNORMAL HIGH (ref 70–99)
Potassium: 4.3 mmol/L (ref 3.5–5.2)
Sodium: 140 mmol/L (ref 134–144)
Total Protein: 6.3 g/dL (ref 6.0–8.5)
eGFR: 92 mL/min/{1.73_m2} (ref >59)

## 2023-07-23 LAB — TSH+FREE T4
Free T4: 1.33 ng/dL (ref 0.82–1.77)
TSH: 1.68 u[IU]/mL (ref 0.450–4.500)

## 2023-07-23 LAB — HEMOGLOBIN A1C
Est. average glucose Bld gHb Est-mCnc: 148 mg/dL
Hgb A1c MFr Bld: 6.8 % — ABNORMAL HIGH (ref 4.8–5.6)

## 2023-07-23 LAB — VITAMIN D 25 HYDROXY (VIT D DEFICIENCY, FRACTURES): Vit D, 25-Hydroxy: 56.8 ng/mL (ref 30.0–100.0)

## 2023-08-05 NOTE — Progress Notes (Signed)
 Please inform the patient that his Hemoglobin A1c is 6.8, which is stable. I recommend that he continue his current treatment regimen, reduce his intake of high-sugar foods and beverages, and increase physical activity to help maintain his blood sugar levels.  All other lab results are stable.

## 2023-08-08 ENCOUNTER — Other Ambulatory Visit: Payer: Self-pay | Admitting: Family Medicine

## 2023-08-08 DIAGNOSIS — E782 Mixed hyperlipidemia: Secondary | ICD-10-CM

## 2023-08-08 DIAGNOSIS — I10 Essential (primary) hypertension: Secondary | ICD-10-CM

## 2023-11-08 ENCOUNTER — Other Ambulatory Visit: Payer: Self-pay

## 2023-11-08 ENCOUNTER — Encounter: Payer: Self-pay | Admitting: Surgical

## 2023-11-08 ENCOUNTER — Ambulatory Visit: Admitting: Surgical

## 2023-11-08 DIAGNOSIS — M25561 Pain in right knee: Secondary | ICD-10-CM | POA: Diagnosis not present

## 2023-11-08 DIAGNOSIS — M25861 Other specified joint disorders, right knee: Secondary | ICD-10-CM

## 2023-11-08 DIAGNOSIS — G8929 Other chronic pain: Secondary | ICD-10-CM

## 2023-11-08 NOTE — Progress Notes (Signed)
 Follow-up Office Visit Note   Patient: Edward Mcgee           Date of Birth: October 09, 1955           MRN: [id number] Visit Date: 11/08/2023 Requested by: Zarwolo, Gloria, FNP [address] [place],  [place] [address] PCP: Zarwolo, Gloria, FNP  Subjective: Chief Complaint  Patient presents with   Right Knee - Pain    HPI: Edward Mcgee is a 68 y.o. male who returns to the office for follow-up visit.    Plan at last visit was: Impression is small recurrent cyst and a moderately arthritic lateral compartment of the right knee. No effusion in the knee. Aspiration of the cyst is performed again today. He may be heading for intervention. Follow-up as needed.   Since then, patient notes the last aspiration/injection of the cyst only provided short-term relief of his symptoms compared with the approximately 1 year of relief that he has had previously.  He was having a lot of increased knee pain laterally for the last few weeks but it has really decreased recently with only slight pain in the last few days.  Takes Tylenol  with good relief.  Requests repeat aspiration/injection.  No new symptoms such as fevers or chills.  He has no numbness or tingling at all in the right leg.  No weakness of the leg.  No abnormal sensation in the shin/calf/dorsum of the right foot.              ROS: All systems reviewed are negative as they relate to the chief complaint within the history of present illness.  Patient denies fevers or chills.  Assessment & Plan: Visit Diagnoses:  1. Chronic pain of right knee   2. Cyst of right knee joint     Plan: Edward Mcgee is a 68 y.o. male who returns to the office for follow-up visit.  Plan from last visit was noted above in HPI.  They now return with decreased relief from last aspiration/injection of the lateral right knee cyst.  We discussed options available to patient.  On ultrasound, this cyst seems to be about one third or one fourth of the size that he used to be  which may explain his lack of symptoms today.  Maybe the cyst was a lot bigger 2 or 3 weeks ago when he was having more severe pain.  With lack of significant improvement after the last aspiration/injection, we did discuss the possibility of surgical excision.  He would like to hold off on this for now and the cyst was localized with ultrasound.  Aspiration was attempted but no fluid was able to be obtained.  Small low-dose cortisone injection was administered and we will see if this helps with his symptoms.  If this provides short relief once again, patient is going to consider surgical excision.  Follow-Up Instructions: No follow-ups on file.   Orders:  Orders Placed This Encounter  Procedures   US  Guided Needle Placement - No Linked Charges   No orders of the defined types were placed in this encounter.     Procedures: Ultrasound-guided right knee ganglion cyst injection on 11/08/2023 1:45 PM Indications: diagnostic evaluation, joint swelling and pain Details: 22 G 1.5 in needle, ultrasound-guided lateral approach  Arthrogram: No  Medications: 5 mL lidocaine  1 %; 0.5 mL bupivacaine  0.25 %; 20 mg triamcinolone acetonide 40 MG/ML Outcome: tolerated well, no immediate complications Procedure, treatment alternatives, risks and benefits explained, specific risks discussed. Consent was  given by the patient. Immediately prior to procedure a time out was called to verify the correct patient, procedure, equipment, support staff and site/side marked as required. Patient was prepped and draped in the usual sterile fashion.       Clinical Data: No additional findings.  Objective: Vital Signs: There were no vitals taken for this visit.  Physical Exam:  Constitutional: Patient appears well-developed HEENT:  Head: Normocephalic Eyes:EOM are normal Neck: Normal range of motion Cardiovascular: Normal rate Pulmonary/chest: Effort normal Neurologic: Patient is alert Skin: Skin is  warm Psychiatric: Patient has normal mood and affect  Ortho Exam: Ortho exam demonstrates right knee without effusion.  There is some increased swelling laterally over the area of the cyst near the lateral femoral condyle.  Palpable DP pulse.  Intact ankle dorsiflexion and plantarflexion strength equivalent to the contralateral side.  Able to perform straight leg raise.  No real tenderness over the lateral or medial joint lines.  No cellulitis or skin changes noted.  Specialty Comments:  No specialty comments available.  Imaging: No results found.   PMFS History: Patient Active Problem List   Diagnosis Date Noted   Dermatitis 07/22/2023   Annual physical exam 07/18/2021   Elevated alkaline phosphatase level 07/18/2021   Need for immunization against influenza 07/18/2021   Hypertension 06/19/2021   Atrophy of quadriceps femoris muscle 01/30/2019   GERD (gastroesophageal reflux disease) 12/24/2017   Groin rash 12/24/2017   Benign prostatic hyperplasia with incomplete bladder emptying 10/23/2017   Positive fecal occult blood test 10/23/2017   Vocal process granuloma 03/01/2017   Laryngopharyngeal reflux (LPR) 03/01/2017   Throat pain 03/01/2017   Seborrheic dermatitis of scalp 08/16/2016   Muscle cramps 05/27/2015   Leg pain, lateral, right 05/27/2015   AV block, 1st degree 02/10/2013   Type 2 diabetes mellitus with hyperglycemia (HCC) 02/10/2013   Hyperlipidemia associated with type 2 diabetes mellitus (HCC) 02/25/2007   BACK PAIN, LUMBAR, CHRONIC 05/31/2006   Past Medical History:  Diagnosis Date   DM (diabetes mellitus), type 2, uncontrolled 02/10/2013   GERD (gastroesophageal reflux disease)    Hyperlipidemia    Hypertension 06/19/2021    Family History  Problem Relation Age of Onset   Hypertension Mother    Breast cancer Sister    Diabetes type II Sister    Hypertension Sister    Breast cancer Sister    Diabetes Mellitus II Sister    Uterine cancer Sister     Diabetes Mellitus II Sister    Diabetes type II Brother    Diabetes Other    Colon cancer Neg Hx    Esophageal cancer Neg Hx    Rectal cancer Neg Hx    Stomach cancer Neg Hx    Liver cancer Neg Hx    Prostate cancer Neg Hx     Past Surgical History:  Procedure Laterality Date   NO PAST SURGERIES     Social History   Occupational History   Occupation: Maintenance  Tobacco Use   Smoking status: Never   Smokeless tobacco: Never  Vaping Use   Vaping status: Never Used  Substance and Sexual Activity   Alcohol  use: Yes    Comment: socially   Drug use: No   Sexual activity: Yes

## 2023-11-10 ENCOUNTER — Encounter: Payer: Self-pay | Admitting: Surgical

## 2023-11-10 MED ORDER — LIDOCAINE HCL 1 % IJ SOLN
5.0000 mL | INTRAMUSCULAR | Status: AC | PRN
Start: 2023-11-08 — End: 2023-11-08
  Administered 2023-11-08: 5 mL

## 2023-11-10 MED ORDER — TRIAMCINOLONE ACETONIDE 40 MG/ML IJ SUSP
20.0000 mg | INTRAMUSCULAR | Status: AC | PRN
Start: 2023-11-08 — End: 2023-11-08
  Administered 2023-11-08: 20 mg via INTRA_ARTICULAR

## 2023-11-10 MED ORDER — BUPIVACAINE HCL 0.25 % IJ SOLN
0.5000 mL | INTRAMUSCULAR | Status: AC | PRN
Start: 2023-11-08 — End: 2023-11-08
  Administered 2023-11-08: .5 mL via INTRA_ARTICULAR

## 2023-11-21 ENCOUNTER — Ambulatory Visit (INDEPENDENT_AMBULATORY_CARE_PROVIDER_SITE_OTHER): Payer: Medicare HMO | Admitting: Family Medicine

## 2023-11-21 ENCOUNTER — Encounter: Payer: Self-pay | Admitting: Family Medicine

## 2023-11-21 VITALS — BP 138/72 | HR 76 | Resp 16 | Ht 64.0 in | Wt 147.0 lb

## 2023-11-21 DIAGNOSIS — Z7984 Long term (current) use of oral hypoglycemic drugs: Secondary | ICD-10-CM

## 2023-11-21 DIAGNOSIS — I10 Essential (primary) hypertension: Secondary | ICD-10-CM | POA: Diagnosis not present

## 2023-11-21 DIAGNOSIS — E785 Hyperlipidemia, unspecified: Secondary | ICD-10-CM

## 2023-11-21 DIAGNOSIS — E1169 Type 2 diabetes mellitus with other specified complication: Secondary | ICD-10-CM

## 2023-11-21 DIAGNOSIS — E559 Vitamin D deficiency, unspecified: Secondary | ICD-10-CM

## 2023-11-21 DIAGNOSIS — E1141 Type 2 diabetes mellitus with diabetic mononeuropathy: Secondary | ICD-10-CM | POA: Diagnosis not present

## 2023-11-21 DIAGNOSIS — E1165 Type 2 diabetes mellitus with hyperglycemia: Secondary | ICD-10-CM

## 2023-11-21 MED ORDER — GLIPIZIDE 10 MG PO TABS
10.0000 mg | ORAL_TABLET | Freq: Two times a day (BID) | ORAL | 1 refills | Status: DC
Start: 2023-11-21 — End: 2024-02-03

## 2023-11-21 NOTE — Patient Instructions (Signed)
 I appreciate the opportunity to provide care to you today!    Follow up:  5 months  Labs: please stop by the lab today to get your blood drawn (CBC, CMP, TSH, Lipid profile, HgA1c, Vit D)  For a Healthier YOU, I Recommend: Reducing your intake of sugar, sodium, carbohydrates, and saturated fats. Increasing your fiber intake by incorporating more whole grains, fruits, and vegetables into your meals. Setting healthy goals with a focus on lowering your consumption of carbs, sugar, and unhealthy fats. Adding variety to your diet by including a wide range of fruits and vegetables. Cutting back on soda and limiting processed foods as much as possible. Staying active: In addition to taking your weight loss medication, aim for at least 150 minutes of moderate-intensity physical activity each week for optimal results.   Please follow up if your symptoms worsen or fail to improve.     Please continue to a heart-healthy diet and increase your physical activities. Try to exercise for at least five days a week.    It was a pleasure to see you and I look forward to continuing to work together on your health and well-being. Please do not hesitate to call the office if you need care or have questions about your care.  In case of emergency, please visit the Emergency Department for urgent care, or contact our clinic at [phone] to schedule an appointment. We're here to help you!   Have a wonderful day and week. With Gratitude, Tniya Bowditch MSN, FNP-BC

## 2023-11-21 NOTE — Assessment & Plan Note (Signed)
 The patient reports compliance on atorvastatin  80 mg daily No complaints of muscle aches and pain Encourage decreasing his intake of greasy, starchy, and fatty foods with increased physical activity Lab Results  Component Value Date   CHOL 138 07/22/2023   HDL 69 07/22/2023   LDLCALC 52 07/22/2023   LDLDIRECT 97.0 11/23/2020   TRIG 92 07/22/2023   CHOLHDL 2.0 07/22/2023

## 2023-11-21 NOTE — Assessment & Plan Note (Addendum)
 The patient reports compliance with Jardiance  25 mg daily  A refills of  Glipizide  10 mg twice daily has been sent to the pharmacy He denies polyuria, polyphagia, and polydipsia. It is recommended to continue his current treatment regimen, reduce intake of high-sugar foods and beverages, and increase physical activity. The patient verbalized understanding. Lab Results  Component Value Date   HGBA1C 6.8 (H) 07/22/2023

## 2023-11-21 NOTE — Assessment & Plan Note (Signed)
 Controlled Asymptomatic in the clinic encouraged to continue taking lisinopril  5 mg daily A low-sodium diet of less than 2300 mg daily is recommended, along with increased physical activity of moderate intensity, aiming for 150 minutes weekly. The patient is encouraged to continue with these lifestyle modifications to help manage their blood pressure effectively.  BP Readings from Last 3 Encounters:  11/21/23 138/72  07/22/23 137/76  03/19/23 138/82

## 2023-11-21 NOTE — Progress Notes (Signed)
 Established Patient Office Visit  Subjective:  Patient ID: Edward Mcgee, male    DOB: 1956/03/05  Age: 68 y.o. MRN: [id number]  CC:  Chief Complaint  Patient presents with   Diabetes    Follow up visit - states they are running out of glipizide  because he has to take 2 daily and only has enough for 1 per day     HPI Edward Mcgee is a 68 y.o. male with past medical history of  Type 2 diabetes, hyperlipidemia, hypertension presents for f/u of  chronic medical conditions. For the details of today's visit, please refer to the assessment and plan.     Past Medical History:  Diagnosis Date   DM (diabetes mellitus), type 2, uncontrolled 02/10/2013   GERD (gastroesophageal reflux disease)    Hyperlipidemia    Hypertension 06/19/2021    Past Surgical History:  Procedure Laterality Date   NO PAST SURGERIES      Family History  Problem Relation Age of Onset   Hypertension Mother    Breast cancer Sister    Diabetes type II Sister    Hypertension Sister    Breast cancer Sister    Diabetes Mellitus II Sister    Uterine cancer Sister    Diabetes Mellitus II Sister    Diabetes type II Brother    Diabetes Other    Colon cancer Neg Hx    Esophageal cancer Neg Hx    Rectal cancer Neg Hx    Stomach cancer Neg Hx    Liver cancer Neg Hx    Prostate cancer Neg Hx     Social History   Socioeconomic History   Marital status: Married    Spouse name: Not on file   Number of children: 3   Years of education: 11   Highest education level: Not on file  Occupational History   Occupation: Maintenance  Tobacco Use   Smoking status: Never   Smokeless tobacco: Never  Vaping Use   Vaping status: Never Used  Substance and Sexual Activity   Alcohol  use: Yes    Comment: socially   Drug use: No   Sexual activity: Yes  Other Topics Concern   Not on file  Social History Narrative   Lives with his wife, pt is retired.    Social Drivers of Corporate investment banker Strain: Low  Risk  (08/08/2022)   Overall Financial Resource Strain (CARDIA)    Difficulty of Paying Living Expenses: Not hard at all  Food Insecurity: No Food Insecurity (08/08/2022)   Hunger Vital Sign    Worried About Running Out of Food in the Last Year: Never true    Ran Out of Food in the Last Year: Never true  Transportation Needs: No Transportation Needs (08/08/2022)   PRAPARE - Administrator, Civil Service (Medical): No    Lack of Transportation (Non-Medical): No  Physical Activity: Insufficiently Active (08/08/2022)   Exercise Vital Sign    Days of Exercise per Week: 1 day    Minutes of Exercise per Session: 60 min  Stress: No Stress Concern Present (08/08/2022)   [employer] of Occupational Health - Occupational Stress Questionnaire    Feeling of Stress : Not at all  Social Connections: Socially Integrated (08/08/2022)   Social Connection and Isolation Panel    Frequency of Communication with Friends and Family: More than three times a week    Frequency of Social Gatherings with Friends and Family: More than three times a  week    Attends Religious Services: More than 4 times per year    Active Member of Clubs or Organizations: Yes    Attends Banker Meetings: More than 4 times per year    Marital Status: Married  Catering manager Violence: Not At Risk (08/08/2022)   Humiliation, Afraid, Rape, and Kick questionnaire    Fear of Current or Ex-Partner: No    Emotionally Abused: No    Physically Abused: No    Sexually Abused: No    Outpatient Medications Prior to Visit  Medication Sig Dispense Refill   Alcohol  Swabs (DROPSAFE ALCOHOL  PREP) 70 % PADS USE SEGUN INDICADO 300 each 3   atorvastatin  (LIPITOR) 80 MG tablet TOME 1 TABLETA CADA DIA 90 tablet 3   blood glucose meter kit and supplies KIT Dispense based on patient and insurance preference. Use before breakfast and at bedtime. ICD E11.9 1 each 0   Cholecalciferol (VITAMIN D3 PO) Take by mouth.      empagliflozin  (JARDIANCE ) 25 MG TABS tablet Take 1 tablet (25 mg total) by mouth daily before breakfast. 90 tablet 2   glipiZIDE  (GLUCOTROL  XL) 5 MG 24 hr tablet TOME 1 TABLETA CADA DIA CON EL DESAYUNO 90 tablet 3   glucose blood (GNP TRUE METRIX GLUCOSE STRIPS) test strip Use as instructed 100 each 12   glucose blood (ONETOUCH ULTRA) test strip USE 2 test strips,  TO CHECK GLUCOSE BID 180 each 3   lisinopril  (ZESTRIL ) 5 MG tablet TOME 1 TABLETA CADA DIA 90 tablet 3   Magnesium 200 MG TABS Take 1 tablet by mouth daily.     mometasone  (ELOCON ) 0.1 % cream Apply qd as needed for rash 45 g 1   omeprazole  (PRILOSEC) 20 MG capsule TOME 1 CAPSULA CADA DIA 90 capsule 3   tamsulosin  (FLOMAX ) 0.4 MG CAPS capsule Take 1 capsule (0.4 mg total) by mouth daily. 90 capsule 3   TRUE METRIX BLOOD GLUCOSE TEST test strip HACER PRUEBA DE AZUCAR EN SANGRE SEGUN INDICADO 300 strip 3   TRUEplus Lancets 33G MISC HACER PRUEBA DE AZUCAR EN SANGRE SEGUN INDICADO 300 each 3   vitamin E 1000 UNIT capsule Take 1,000 Units by mouth daily.     Zinc Sulfate (ZINC 15 PO) Take by mouth.     glipiZIDE  (GLUCOTROL ) 10 MG tablet Take 1 tablet (10 mg total) by mouth 2 (two) times daily before a meal. 60 tablet 3   acetaminophen  (TYLENOL ) 325 MG tablet Take 650 mg by mouth every 6 (six) hours as needed.     Facility-Administered Medications Prior to Visit  Medication Dose Route Frequency Provider Last Rate Last Admin   0.9 %  sodium chloride  infusion  500 mL Intravenous Continuous Janel Medford, MD        Allergies  Allergen Reactions   Other Shortness Of Breath    Raspberry juice. Pt report SOB,itchy and swelling.    Peanut-Containing Drug Products Other (See Comments)    Itching, can breath and hives    ROS Review of Systems  Constitutional:  Negative for fatigue and fever.  Eyes:  Negative for visual disturbance.  Respiratory:  Negative for chest tightness and shortness of breath.   Cardiovascular:  Negative for  chest pain and palpitations.  Neurological:  Negative for dizziness and headaches.      Objective:    Physical Exam HENT:     Head: Normocephalic.     Right Ear: External ear normal.     Left  Ear: External ear normal.     Nose: No congestion or rhinorrhea.     Mouth/Throat:     Mouth: Mucous membranes are moist.   Cardiovascular:     Rate and Rhythm: Regular rhythm.     Heart sounds: No murmur heard. Pulmonary:     Effort: No respiratory distress.     Breath sounds: Normal breath sounds.   Neurological:     Mental Status: He is alert.     BP 138/72   Pulse 76   Resp 16   Ht 5' 4 (1.626 m)   Wt 147 lb (66.7 kg)   SpO2 96%   BMI 25.23 kg/m  Wt Readings from Last 3 Encounters:  11/21/23 147 lb (66.7 kg)  07/22/23 148 lb 1.9 oz (67.2 kg)  03/19/23 148 lb 0.6 oz (67.2 kg)    Lab Results  Component Value Date   TSH 1.680 07/22/2023   Lab Results  Component Value Date   WBC 6.4 07/22/2023   HGB 14.5 07/22/2023   HCT 44.8 07/22/2023   MCV 85 07/22/2023   PLT 259 07/22/2023   Lab Results  Component Value Date   NA 140 07/22/2023   K 4.3 07/22/2023   CO2 22 07/22/2023   GLUCOSE 124 (H) 07/22/2023   BUN 18 07/22/2023   CREATININE 0.91 07/22/2023   BILITOT 0.6 07/22/2023   ALKPHOS 80 07/22/2023   AST 15 07/22/2023   ALT 16 07/22/2023   PROT 6.3 07/22/2023   ALBUMIN 4.3 07/22/2023   CALCIUM  9.7 07/22/2023   ANIONGAP 10 05/07/2016   EGFR 92 07/22/2023   GFR 89.77 11/23/2020   Lab Results  Component Value Date   CHOL 138 07/22/2023   Lab Results  Component Value Date   HDL 69 07/22/2023   Lab Results  Component Value Date   LDLCALC 52 07/22/2023   Lab Results  Component Value Date   TRIG 92 07/22/2023   Lab Results  Component Value Date   CHOLHDL 2.0 07/22/2023   Lab Results  Component Value Date   HGBA1C 6.8 (H) 07/22/2023      Assessment & Plan:  Type 2 diabetes mellitus with hyperglycemia, without long-term current use of  insulin  (HCC) Assessment & Plan: The patient reports compliance with Jardiance  25 mg daily  A refills of  Glipizide  10 mg twice daily has been sent to the pharmacy He denies polyuria, polyphagia, and polydipsia. It is recommended to continue his current treatment regimen, reduce intake of high-sugar foods and beverages, and increase physical activity. The patient verbalized understanding. Lab Results  Component Value Date   HGBA1C 6.8 (H) 07/22/2023     Orders: -     glipiZIDE ; Take 1 tablet (10 mg total) by mouth 2 (two) times daily before a meal.  Dispense: 90 tablet; Refill: 1 -     TSH -     Hemoglobin A1c  Primary hypertension Assessment & Plan: Controlled Asymptomatic in the clinic encouraged to continue taking lisinopril  5 mg daily A low-sodium diet of less than 2300 mg daily is recommended, along with increased physical activity of moderate intensity, aiming for 150 minutes weekly. The patient is encouraged to continue with these lifestyle modifications to help manage their blood pressure effectively.  BP Readings from Last 3 Encounters:  11/21/23 138/72  07/22/23 137/76  03/19/23 138/82      Hyperlipidemia associated with type 2 diabetes mellitus (HCC) Assessment & Plan: The patient reports compliance on atorvastatin  80 mg daily No complaints  of muscle aches and pain Encourage decreasing his intake of greasy, starchy, and fatty foods with increased physical activity Lab Results  Component Value Date   CHOL 138 07/22/2023   HDL 69 07/22/2023   LDLCALC 52 07/22/2023   LDLDIRECT 97.0 11/23/2020   TRIG 92 07/22/2023   CHOLHDL 2.0 07/22/2023     Orders: -     CBC with Differential/Platelet -     CMP14+EGFR -     Lipid panel -     Hemoglobin A1c  Vitamin D  deficiency -     VITAMIN D  25 Hydroxy (Vit-D Deficiency, Fractures)  Note: This chart has been completed using Engineer, civil (consulting) software, and while attempts have been made to ensure accuracy, certain  words and phrases may not be transcribed as intended.    Follow-up: Return in about 5 months (around 04/22/2024).   Jessye Imhoff, FNP

## 2023-11-22 LAB — CBC WITH DIFFERENTIAL/PLATELET

## 2023-11-23 LAB — CBC WITH DIFFERENTIAL/PLATELET
Basos: 1 %
EOS (ABSOLUTE): 0.1 10*3/uL (ref 0.0–0.2)
Eos: 4 %
Hematocrit: 48.9 % (ref 37.5–51.0)
Hemoglobin: 14.8 g/dL (ref 13.0–17.7)
Immature Granulocytes: 0 %
Immature Granulocytes: 0 10*3/uL (ref 0.0–0.1)
Lymphs: 35 %
MCH: 27.2 pg (ref 26.6–33.0)
MCHC: 30.3 g/dL — ABNORMAL LOW (ref 31.5–35.7)
MCV: 90 fL (ref 79–97)
Monocytes Absolute: 0.3 10*3/uL (ref 0.0–0.4)
Monocytes Absolute: 0.5 10*3/uL (ref 0.1–0.9)
Monocytes: 7 %
Neutrophils Absolute: 2.5 10*3/uL (ref 0.7–3.1)
Neutrophils Absolute: 3.8 10*3/uL (ref 1.4–7.0)
Neutrophils: 53 %
Platelets: 272 10*3/uL (ref 150–450)
RBC: 5.44 x10E6/uL (ref 4.14–5.80)
RDW: 13.1 % (ref 11.6–15.4)
WBC: 7.1 10*3/uL (ref 3.4–10.8)

## 2023-11-23 LAB — CMP14+EGFR
ALT: 23 IU/L (ref 0–44)
AST: 22 IU/L (ref 0–40)
Albumin: 4.4 g/dL (ref 3.9–4.9)
Alkaline Phosphatase: 86 IU/L (ref 44–121)
BUN/Creatinine Ratio: 16 (ref 10–24)
BUN: 16 mg/dL (ref 8–27)
Bilirubin Total: 0.4 mg/dL (ref 0.0–1.2)
CO2: 22 mmol/L (ref 20–29)
Calcium: 9.6 mg/dL (ref 8.6–10.2)
Chloride: 104 mmol/L (ref 96–106)
Creatinine, Ser: 1 mg/dL (ref 0.76–1.27)
Globulin, Total: 2.4 g/dL (ref 1.5–4.5)
Glucose: 90 mg/dL (ref 70–99)
Potassium: 4.6 mmol/L (ref 3.5–5.2)
Sodium: 143 mmol/L (ref 134–144)
Total Protein: 6.8 g/dL (ref 6.0–8.5)
eGFR: 81 mL/min/{1.73_m2} (ref >59)

## 2023-11-23 LAB — HEMOGLOBIN A1C
Est. average glucose Bld gHb Est-mCnc: 140 mg/dL
Hgb A1c MFr Bld: 6.5 % — ABNORMAL HIGH (ref 4.8–5.6)

## 2023-11-23 LAB — LIPID PANEL
Chol/HDL Ratio: 2.2 ratio (ref 0.0–5.0)
Cholesterol, Total: 152 mg/dL (ref 100–199)
HDL: 69 mg/dL (ref >39)
LDL Chol Calc (NIH): 71 mg/dL (ref 0–99)
Triglycerides: 57 mg/dL (ref 0–149)
VLDL Cholesterol Cal: 12 mg/dL (ref 5–40)

## 2023-11-23 LAB — TSH: TSH: 1.46 u[IU]/mL (ref 0.450–4.500)

## 2023-11-23 LAB — VITAMIN D 25 HYDROXY (VIT D DEFICIENCY, FRACTURES): Vit D, 25-Hydroxy: 48.8 ng/mL (ref 30.0–100.0)

## 2023-11-28 ENCOUNTER — Ambulatory Visit

## 2023-11-28 DIAGNOSIS — E1165 Type 2 diabetes mellitus with hyperglycemia: Secondary | ICD-10-CM

## 2023-11-28 DIAGNOSIS — E1141 Type 2 diabetes mellitus with diabetic mononeuropathy: Secondary | ICD-10-CM

## 2023-11-28 LAB — HM DIABETES EYE EXAM

## 2023-11-28 NOTE — Progress Notes (Signed)
 Arrived 11/28/2023 and has given verbal consent to obtain images and complete their overdue diabetic retinal screening.  The images have been sent to an ophthalmologist or optometrist for review and interpretation.  Results will be sent back to [facility] for review.  Patient has been informed they will be contacted when we receive the results via telephone or MyChart.

## 2023-12-13 ENCOUNTER — Ambulatory Visit: Payer: Self-pay | Admitting: Family Medicine

## 2023-12-26 ENCOUNTER — Other Ambulatory Visit: Payer: Self-pay | Admitting: Family Medicine

## 2023-12-26 DIAGNOSIS — K2100 Gastro-esophageal reflux disease with esophagitis, without bleeding: Secondary | ICD-10-CM

## 2024-02-03 ENCOUNTER — Other Ambulatory Visit: Payer: Self-pay | Admitting: Family Medicine

## 2024-02-03 DIAGNOSIS — E1165 Type 2 diabetes mellitus with hyperglycemia: Secondary | ICD-10-CM

## 2024-02-11 ENCOUNTER — Other Ambulatory Visit: Payer: Self-pay | Admitting: Family Medicine

## 2024-03-05 DIAGNOSIS — H33103 Unspecified retinoschisis, bilateral: Secondary | ICD-10-CM | POA: Diagnosis not present

## 2024-03-05 DIAGNOSIS — E119 Type 2 diabetes mellitus without complications: Secondary | ICD-10-CM | POA: Diagnosis not present

## 2024-03-05 DIAGNOSIS — H52203 Unspecified astigmatism, bilateral: Secondary | ICD-10-CM | POA: Diagnosis not present

## 2024-03-05 DIAGNOSIS — H524 Presbyopia: Secondary | ICD-10-CM | POA: Diagnosis not present

## 2024-03-05 DIAGNOSIS — H2513 Age-related nuclear cataract, bilateral: Secondary | ICD-10-CM | POA: Diagnosis not present

## 2024-03-05 DIAGNOSIS — H5203 Hypermetropia, bilateral: Secondary | ICD-10-CM | POA: Diagnosis not present

## 2024-03-05 LAB — OPHTHALMOLOGY REPORT-SCANNED

## 2024-03-09 ENCOUNTER — Other Ambulatory Visit: Payer: Self-pay | Admitting: Family Medicine

## 2024-03-09 DIAGNOSIS — E118 Type 2 diabetes mellitus with unspecified complications: Secondary | ICD-10-CM

## 2024-04-13 ENCOUNTER — Encounter: Payer: Self-pay | Admitting: Radiology

## 2024-04-24 ENCOUNTER — Ambulatory Visit: Admitting: Family Medicine

## 2024-05-25 ENCOUNTER — Other Ambulatory Visit: Payer: Self-pay | Admitting: Family Medicine

## 2024-05-25 DIAGNOSIS — I10 Essential (primary) hypertension: Secondary | ICD-10-CM

## 2024-05-25 DIAGNOSIS — E782 Mixed hyperlipidemia: Secondary | ICD-10-CM

## 2024-06-02 ENCOUNTER — Ambulatory Visit (INDEPENDENT_AMBULATORY_CARE_PROVIDER_SITE_OTHER)

## 2024-06-02 VITALS — Ht 64.0 in | Wt 147.0 lb

## 2024-06-02 DIAGNOSIS — Z0000 Encounter for general adult medical examination without abnormal findings: Secondary | ICD-10-CM

## 2024-06-02 DIAGNOSIS — I10 Essential (primary) hypertension: Secondary | ICD-10-CM

## 2024-06-02 DIAGNOSIS — E038 Other specified hypothyroidism: Secondary | ICD-10-CM

## 2024-06-02 DIAGNOSIS — E1169 Type 2 diabetes mellitus with other specified complication: Secondary | ICD-10-CM

## 2024-06-02 DIAGNOSIS — E559 Vitamin D deficiency, unspecified: Secondary | ICD-10-CM

## 2024-06-02 DIAGNOSIS — E1165 Type 2 diabetes mellitus with hyperglycemia: Secondary | ICD-10-CM

## 2024-06-02 DIAGNOSIS — E0789 Other specified disorders of thyroid: Secondary | ICD-10-CM

## 2024-06-02 NOTE — Progress Notes (Signed)
 "  HM Addressed: Diabetic Foot Exam recommended HM Addressed: Yearly labs ordered. Podiatry referral placed for diabetic foot exam  Chief Complaint  Patient presents with   Medicare Wellness     Subjective:   Edward Mcgee is a 68 y.o. male who presents for a [employer] Visit.  Visit info / Clinical Intake: Medicare Wellness Visit Type:: Subsequent Annual Wellness Visit Persons participating in visit and providing information:: patient Medicare Wellness Visit Mode:: Telephone If telephone:: video error Since this visit was completed virtually, some vitals may be partially provided or unavailable. Missing vitals are due to the limitations of the virtual format.: Documented vitals are patient reported If Telephone or Video please confirm:: I connected with patient using audio/video enable telemedicine. I verified patient identity with two identifiers, discussed telehealth limitations, and patient agreed to proceed. Patient Location:: home Provider Location:: office Interpreter Needed?: Yes Interpreter Agency: [employer] Interpreter Name: Vila Ames ID: [id number] Patient Declined Interpreter : No Interpretation services provided by: [facility] Interpreter Patient signed [place] waiver: No Pre-visit prep was completed: yes AWV questionnaire completed by patient prior to visit?: no Living arrangements:: lives with spouse/significant other Patient's Overall Health Status Rating: good Typical amount of pain: none Does pain affect daily life?: no Are you currently prescribed opioids?: no  Dietary Habits and Nutritional Risks How many meals a day?: 3 (patient states he eats between 2-3 meals daily) Eats fruit and vegetables daily?: yes Most meals are obtained by: having others provide food (wife prepares meals) In the last 2 weeks, have you had any of the following?: none Diabetic:: (!) yes Any non-healing wounds?: no How often do you check your BS?:  2 Would you like to be referred to a Nutritionist or for Diabetic Management? : no  Functional Status Activities of Daily Living (to include ambulation/medication): Independent Ambulation: Independent Medication Administration: Independent Home Management (perform basic housework or laundry): Independent Manage your own finances?: yes Primary transportation is: driving Concerns about vision?: no *vision screening is required for WTM* Concerns about hearing?: no  Fall Screening Falls in the past year?: 0 Number of falls in past year: 0 Was there an injury with Fall?: 0 Fall Risk Category Calculator: 0 Patient Fall Risk Level: Low Fall Risk  Fall Risk Patient at Risk for Falls Due to: No Fall Risks Fall risk Follow up: Falls evaluation completed; Education provided; Falls prevention discussed  Home and Transportation Safety: All rugs have non-skid backing?: yes All stairs or steps have railings?: yes Grab bars in the bathtub or shower?: (!) no Have non-skid surface in bathtub or shower?: yes Good home lighting?: yes Regular seat belt use?: yes Hospital stays in the last year:: no  Cognitive Assessment Difficulty concentrating, remembering, or making decisions? : no Will 6CIT or Mini Cog be Completed: yes What year is it?: 0 points What month is it?: 0 points Give patient an address phrase to remember (5 components): [address] [facility] [place] Count backwards from 20 to 1: 0 points Say the months of the year in reverse: 0 points Repeat the address phrase from earlier: 0 points  Advance Directives (For Healthcare) Does Patient Have a Medical Advance Directive?: No    Allergies (verified) Other and Peanut-containing drug products   Current Medications (verified) Outpatient Encounter Medications as of 06/02/2024  Medication Sig   acetaminophen  (TYLENOL ) 325 MG tablet Take 650 mg by mouth every 6 (six) hours as needed.   Alcohol  Swabs (DROPSAFE ALCOHOL  PREP) 70 % PADS  USE SEGUN INDICADO   atorvastatin  (LIPITOR) 80 MG tablet TOME 1 TABLETA CADA DIA   blood glucose meter kit and supplies KIT Dispense based on patient and insurance preference. Use before breakfast and at bedtime. ICD E11.9   Cholecalciferol (VITAMIN D3 PO) Take by mouth.   glipiZIDE  (GLUCOTROL  XL) 5 MG 24 hr tablet TOME 1 TABLETA CADA DIA CON EL DESAYUNO   glipiZIDE  (GLUCOTROL ) 10 MG tablet TOME 1 TABLETA DOS VECES AL DIA ANTES DE LAS COMIDAS SEGUN INDICADO   glucose blood (GNP TRUE METRIX GLUCOSE STRIPS) test strip Use as instructed   glucose blood (ONETOUCH ULTRA) test strip USE 2 test strips,  TO CHECK GLUCOSE BID   JARDIANCE  25 MG TABS tablet TAKE ONE TABLET BY MOUTH DAILY   lisinopril  (ZESTRIL ) 5 MG tablet TOME 1 TABLETA CADA DIA   Magnesium 200 MG TABS Take 1 tablet by mouth daily.   mometasone  (ELOCON ) 0.1 % cream Apply qd as needed for rash   omeprazole  (PRILOSEC) 20 MG capsule TOME 1 CAPSULA CADA DIA   tamsulosin  (FLOMAX ) 0.4 MG CAPS capsule Take 1 capsule (0.4 mg total) by mouth daily.   TRUE METRIX BLOOD GLUCOSE TEST test strip HACER PRUEBA DE AZUCAR EN SANGRE SEGUN INDICADO   TRUEplus Lancets 33G MISC HACER PRUEBA DE AZUCAR EN SANGRE SEGUN INDICADO   vitamin E 1000 UNIT capsule Take 1,000 Units by mouth daily.   Zinc Sulfate (ZINC 15 PO) Take by mouth.   Facility-Administered Encounter Medications as of 06/02/2024  Medication   0.9 %  sodium chloride  infusion    History: Past Medical History:  Diagnosis Date   DM (diabetes mellitus), type 2, uncontrolled 02/10/2013   GERD (gastroesophageal reflux disease)    Hyperlipidemia    Hypertension 06/19/2021   Past Surgical History:  Procedure Laterality Date   NO PAST SURGERIES     Family History  Problem Relation Age of Onset   Hypertension Mother    Breast cancer Sister    Diabetes type II Sister    Hypertension Sister    Breast cancer Sister    Diabetes Mellitus II Sister    Uterine cancer Sister    Diabetes  Mellitus II Sister    Diabetes type II Brother    Diabetes Other    Colon cancer Neg Hx    Esophageal cancer Neg Hx    Rectal cancer Neg Hx    Stomach cancer Neg Hx    Liver cancer Neg Hx    Prostate cancer Neg Hx    Social History   Occupational History   Occupation: Maintenance  Tobacco Use   Smoking status: Never   Smokeless tobacco: Never  Vaping Use   Vaping status: Never Used  Substance and Sexual Activity   Alcohol  use: Yes    Comment: socially   Drug use: No   Sexual activity: Yes   Tobacco Counseling Counseling given: Yes  SDOH Screenings   Food Insecurity: No Food Insecurity (06/02/2024)  Housing: Low Risk (06/02/2024)  Transportation Needs: No Transportation Needs (06/02/2024)  Utilities: Not At Risk (06/02/2024)  Alcohol  Screen: Low Risk (08/08/2022)  Depression (PHQ2-9): Low Risk (06/02/2024)  Financial Resource Strain: Low Risk (08/08/2022)  Physical Activity: Patient Declined (06/02/2024)  Social Connections: Socially Integrated (06/02/2024)  Stress: No Stress Concern Present (06/02/2024)  Tobacco Use: Low Risk (06/02/2024)  Health Literacy: Adequate Health Literacy (06/02/2024)   See flowsheets for full screening details  Depression Screen PHQ 2 & 9 Depression Scale- Over the past 2 weeks,  how often have you been bothered by any of the following problems? Little interest or pleasure in doing things: 0 Feeling down, depressed, or hopeless (PHQ Adolescent also includes...irritable): 0 PHQ-2 Total Score: 0 Trouble falling or staying asleep, or sleeping too much: 0 Feeling tired or having little energy: 0 Poor appetite or overeating (PHQ Adolescent also includes...weight loss): 0 Feeling bad about yourself - or that you are a failure or have let yourself or your family down: 0 Trouble concentrating on things, such as reading the newspaper or watching television (PHQ Adolescent also includes...like school work): 0 Moving or speaking so slowly that other  people could have noticed. Or the opposite - being so fidgety or restless that you have been moving around a lot more than usual: 0 Thoughts that you would be better off dead, or of hurting yourself in some way: 0 PHQ-9 Total Score: 0 If you checked off any problems, how difficult have these problems made it for you to do your work, take care of things at home, or get along with other people?: Not difficult at all  Depression Treatment Depression Interventions/Treatment : EYV7-0 Score <4 Follow-up Not Indicated     Goals Addressed               This Visit's Progress     Remain active and healthy (pt-stated)               Objective:    Today's Vitals   06/02/24 1416  Weight: 147 lb (66.7 kg)  Height: 5' 4 (1.626 m)   Body mass index is 25.23 kg/m.  Hearing/Vision screen Hearing Screening - Comments:: Patient denies any hearing difficulties.   Vision Screening - Comments:: Wears rx glasses - up to date with routine eye exams with  Ozell Bertin  Immunizations and Health Maintenance Health Maintenance  Topic Date Due   Diabetic kidney evaluation - Urine ACR  07/26/2023   Medicare Annual Wellness (AWV)  08/09/2023   FOOT EXAM  11/13/2023   COVID-19 Vaccine (7 - 2025-26 season) 02/10/2024   HEMOGLOBIN A1C  05/22/2024   Diabetic kidney evaluation - eGFR measurement  11/20/2024   OPHTHALMOLOGY EXAM  03/05/2025   Colonoscopy  06/25/2026   DTaP/Tdap/Td (3 - Td or Tdap) 07/20/2030   Pneumococcal Vaccine: 50+ Years  Completed   Influenza Vaccine  Completed   Hepatitis C Screening  Completed   Zoster Vaccines- Shingrix  Completed   Meningococcal B Vaccine  Aged Out        Assessment/Plan:  This is a routine wellness examination for Edward Mcgee.  Patient Care Team: Bacchus, Meade PEDLAR, FNP as PCP - General (Family Medicine) Jacinto Lonni PARAS, [facility] (Inactive) (Pharmacist) Bertin Ozell, MD as Consulting Physician (Ophthalmology) Addie Cordella Hamilton, MD as  Consulting Physician (Orthopedic Surgery)  I have personally reviewed and noted the following in the patients chart:   Medical and social history Use of alcohol , tobacco or illicit drugs  Current medications and supplements including opioid prescriptions. Functional ability and status Nutritional status Physical activity Advanced directives List of other physicians Hospitalizations, surgeries, and ER visits in previous 12 months Vitals Screenings to include cognitive, depression, and falls Referrals and appointments  Orders Placed This Encounter  Procedures   Microalbumin / creatinine urine ratio   Hemoglobin A1c   CMP14+EGFR   Lipid panel   TSH   VITAMIN D  25 Hydroxy (Vit-D Deficiency, Fractures)   CBC with Differential/Platelet   Ambulatory referral to Podiatry    Referral Priority:  Routine    Referral Type:   Consultation    Referral Reason:   Specialty Services Required    Referred to Provider:   Roddie Bring, DPM    Requested Specialty:   Podiatry    Number of Visits Requested:   1   In addition, I have reviewed and discussed with patient certain preventive protocols, quality metrics, and best practice recommendations. A written personalized care plan for preventive services as well as general preventive health recommendations were provided to patient.   Fajr Fife, CMA   06/02/2024   Return Monday June 07, 2025 at 1:50 pm, for yearly Medicare Visit at [facility].  After Visit Summary: (MyChart) Due to this being a telephonic visit, the after visit summary with patients personalized plan was offered to patient via MyChart    "

## 2024-06-02 NOTE — Patient Instructions (Addendum)
 Edward Mcgee,  Thank you for taking the time for your Medicare Wellness Visit. I appreciate your continued commitment to your health goals. Please review the care plan we discussed, and feel free to reach out if I can assist you further.  Please note that Annual Wellness Visits do not include a physical exam. Some assessments may be limited, especially if the visit was conducted virtually. If needed, we may recommend an in-person follow-up with your provider.  Ongoing Care Seeing your primary care provider every 3 to 6 months helps us  monitor your health and provide consistent, personalized care.   1 year follow up for Medicare well visit: Monday June 07, 2025 at 1:50 pm with medicare wellness nurse in office  Referrals If a referral was made during today's visit and you haven't received any updates within two weeks, please contact the referred provider directly to check on the status.  Podiatrist Referral for Diabetic Foot Exam  Velma Boehringer, DPM  [address] [place] Phone [phone]  Lab Orders         Microalbumin / creatinine urine ratio         Hemoglobin A1c         CMP14+EGFR         Lipid panel         TSH         VITAMIN D  25 Hydroxy (Vit-D Deficiency, Fractures)         CBC with Differential/Platelet    Have these labs completed at [facility].   Recommended Screenings:  Health Maintenance  Topic Date Due   Yearly kidney health urinalysis for diabetes  07/26/2023   Medicare Annual Wellness Visit  08/09/2023   Complete foot exam   11/13/2023   COVID-19 Vaccine (7 - 2025-26 season) 02/10/2024   Hemoglobin A1C  05/22/2024   Yearly kidney function blood test for diabetes  11/20/2024   Eye exam for diabetics  03/05/2025   Colon Cancer Screening  06/25/2026   DTaP/Tdap/Td vaccine (3 - Td or Tdap) 07/20/2030   Pneumococcal Vaccine for age over 63  Completed   Flu Shot  Completed   Hepatitis C Screening  Completed   Zoster (Shingles) Vaccine   Completed   Meningitis B Vaccine  Aged Out       06/02/2024    2:23 PM  Advanced Directives  Does Patient Have a Medical Advance Directive? No    Vision: Annual vision screenings are recommended for early detection of glaucoma, cataracts, and diabetic retinopathy. These exams can also reveal signs of chronic conditions such as diabetes and high blood pressure.  Dental: Annual dental screenings help detect early signs of oral cancer, gum disease, and other conditions linked to overall health, including heart disease and diabetes.  Please see the attached documents for additional preventive care recommendations.

## 2024-06-25 NOTE — Progress Notes (Signed)
 Edward Mcgee                                          MRN: [id number]   06/25/2024   The VBCI Quality Team Specialist reviewed this patient medical record for the purposes of chart review for care gap closure. The following were reviewed: chart review for care gap closure-kidney health evaluation for diabetes:eGFR  and uACR.    VBCI Quality Team

## 2024-07-16 ENCOUNTER — Telehealth: Admitting: Family Medicine

## 2024-07-16 DIAGNOSIS — E038 Other specified hypothyroidism: Secondary | ICD-10-CM

## 2024-07-16 DIAGNOSIS — R42 Dizziness and giddiness: Secondary | ICD-10-CM

## 2024-07-16 DIAGNOSIS — E7849 Other hyperlipidemia: Secondary | ICD-10-CM

## 2024-07-16 DIAGNOSIS — R7301 Impaired fasting glucose: Secondary | ICD-10-CM

## 2024-07-16 DIAGNOSIS — E559 Vitamin D deficiency, unspecified: Secondary | ICD-10-CM

## 2024-07-16 MED ORDER — MECLIZINE HCL 12.5 MG PO TABS: 12.5000 mg | ORAL_TABLET | Freq: Three times a day (TID) | ORAL | 0 refills | Status: AC | PRN

## 2024-07-16 NOTE — Progress Notes (Unsigned)
 "  Virtual Visit via Video Note  I connected with Edward Mcgee on 07/16/24 at  9:40 AM EST by a video enabled telemedicine application and verified that I am speaking with the correct person using two identifiers.  Patient Location: Home Provider Location: Home Office  I discussed the limitations, risks, security, and privacy concerns of performing an evaluation and management service by video and the availability of in person appointments. I also discussed with the patient that there may be a patient responsible charge related to this service. The patient expressed understanding and agreed to proceed.  Subjective: PCP: Edman Meade PEDLAR, FNP  Chief Complaint  Patient presents with   Medical Management of Chronic Issues    Follow up    HPI .glo   Dizzy when wak up from bed in the morning and move head around.  Feel like the room is spinning 3 weeks ago  has been ongoign for a while.  Staying well hydrated  Orthosatic bp ROS: Per HPI Current Medications[1]  Observations/Objective: There were no vitals filed for this visit. Physical Exam  Assessment and Plan: There are no diagnoses linked to this encounter.  Follow Up Instructions: No follow-ups on file.   I discussed the assessment and treatment plan with the patient. The patient was provided an opportunity to ask questions, and all were answered. The patient agreed with the plan and demonstrated an understanding of the instructions.   The patient was advised to call back or seek an in-person evaluation if the symptoms worsen or if the condition fails to improve as anticipated.  The above assessment and management plan was discussed with the patient. The patient verbalized understanding of and has agreed to the management plan.   Marnie Fazzino  Z Bacchus, FNP     [1]  Current Outpatient Medications:    acetaminophen  (TYLENOL ) 325 MG tablet, Take 650 mg by mouth every 6 (six) hours as needed., Disp: , Rfl:    Alcohol  Swabs  (DROPSAFE ALCOHOL  PREP) 70 % PADS, USE SEGUN INDICADO, Disp: 300 each, Rfl: 3   atorvastatin  (LIPITOR) 80 MG tablet, TOME 1 TABLETA CADA DIA, Disp: 90 tablet, Rfl: 3   blood glucose meter kit and supplies KIT, Dispense based on patient and insurance preference. Use before breakfast and at bedtime. ICD E11.9, Disp: 1 each, Rfl: 0   Cholecalciferol (VITAMIN D3 PO), Take by mouth., Disp: , Rfl:    glipiZIDE  (GLUCOTROL  XL) 5 MG 24 hr tablet, TOME 1 TABLETA CADA DIA CON EL DESAYUNO, Disp: 90 tablet, Rfl: 3   glipiZIDE  (GLUCOTROL ) 10 MG tablet, TOME 1 TABLETA DOS VECES AL DIA ANTES DE LAS COMIDAS SEGUN INDICADO, Disp: 180 tablet, Rfl: 3   glucose blood (GNP TRUE METRIX GLUCOSE STRIPS) test strip, Use as instructed, Disp: 100 each, Rfl: 12   glucose blood (ONETOUCH ULTRA) test strip, USE 2 test strips,  TO CHECK GLUCOSE BID, Disp: 180 each, Rfl: 3   JARDIANCE  25 MG TABS tablet, TAKE ONE TABLET BY MOUTH DAILY, Disp: 90 tablet, Rfl: 1   lisinopril  (ZESTRIL ) 5 MG tablet, TOME 1 TABLETA CADA DIA, Disp: 90 tablet, Rfl: 3   Magnesium 200 MG TABS, Take 1 tablet by mouth daily., Disp: , Rfl:    mometasone  (ELOCON ) 0.1 % cream, Apply qd as needed for rash, Disp: 45 g, Rfl: 1   omeprazole  (PRILOSEC) 20 MG capsule, TOME 1 CAPSULA CADA DIA, Disp: 90 capsule, Rfl: 3   tamsulosin  (FLOMAX ) 0.4 MG CAPS capsule, Take 1 capsule (0.4 mg  total) by mouth daily., Disp: 90 capsule, Rfl: 3   TRUE METRIX BLOOD GLUCOSE TEST test strip, HACER PRUEBA DE AZUCAR EN SANGRE SEGUN INDICADO, Disp: 300 strip, Rfl: 3   TRUEplus Lancets 33G MISC, HACER PRUEBA DE AZUCAR EN SANGRE SEGUN INDICADO, Disp: 300 each, Rfl: 3   vitamin E 1000 UNIT capsule, Take 1,000 Units by mouth daily., Disp: , Rfl:    Zinc Sulfate (ZINC 15 PO), Take by mouth., Disp: , Rfl:   Current Facility-Administered Medications:    0.9 %  sodium chloride  infusion, 500 mL, Intravenous, Continuous, Teressa Toribio SQUIBB, MD  "

## 2024-11-18 ENCOUNTER — Ambulatory Visit: Payer: Self-pay | Admitting: Nurse Practitioner

## 2025-06-07 ENCOUNTER — Ambulatory Visit: Payer: Self-pay
